# Patient Record
Sex: Female | Born: 1935 | Race: White | Hispanic: No | Smoking: Never smoker
Health system: Southern US, Community
[De-identification: ages and names within clinical notes are randomized; demographics above are authoritative.]

## PROBLEM LIST (undated history)

## (undated) DIAGNOSIS — I493 Ventricular premature depolarization: Secondary | ICD-10-CM

## (undated) DIAGNOSIS — I4719 Other supraventricular tachycardia: Secondary | ICD-10-CM

## (undated) DIAGNOSIS — I951 Orthostatic hypotension: Secondary | ICD-10-CM

## (undated) DIAGNOSIS — I340 Nonrheumatic mitral (valve) insufficiency: Secondary | ICD-10-CM

## (undated) DIAGNOSIS — E7800 Pure hypercholesterolemia, unspecified: Secondary | ICD-10-CM

## (undated) DIAGNOSIS — I471 Supraventricular tachycardia: Secondary | ICD-10-CM

## (undated) DIAGNOSIS — C44722 Squamous cell carcinoma of skin of right lower limb, including hip: Secondary | ICD-10-CM

## (undated) DIAGNOSIS — I10 Essential (primary) hypertension: Secondary | ICD-10-CM

## (undated) DIAGNOSIS — R002 Palpitations: Secondary | ICD-10-CM

## (undated) HISTORY — DX: Pure hypercholesterolemia, unspecified: E78.00

## (undated) HISTORY — DX: Squamous cell carcinoma of skin of right lower limb, including hip: C44.722

## (undated) HISTORY — DX: Ventricular premature depolarization: I49.3

## (undated) HISTORY — DX: Supraventricular tachycardia: I47.1

## (undated) HISTORY — DX: Essential (primary) hypertension: I10

## (undated) HISTORY — PX: APPENDECTOMY: SHX54

## (undated) HISTORY — DX: Palpitations: R00.2

## (undated) HISTORY — DX: Nonrheumatic mitral (valve) insufficiency: I34.0

## (undated) HISTORY — DX: Other supraventricular tachycardia: I47.19

## (undated) HISTORY — DX: Orthostatic hypotension: I95.1

---

## 1998-04-30 ENCOUNTER — Other Ambulatory Visit: Admission: RE | Admit: 1998-04-30 | Discharge: 1998-04-30 | Payer: Self-pay | Admitting: Obstetrics & Gynecology

## 1998-09-06 ENCOUNTER — Emergency Department (HOSPITAL_COMMUNITY): Admission: EM | Admit: 1998-09-06 | Discharge: 1998-09-06 | Payer: Self-pay | Admitting: Emergency Medicine

## 1999-06-28 ENCOUNTER — Encounter: Payer: Self-pay | Admitting: Cardiology

## 1999-06-28 ENCOUNTER — Inpatient Hospital Stay (HOSPITAL_COMMUNITY): Admission: EM | Admit: 1999-06-28 | Discharge: 1999-07-02 | Payer: Self-pay | Admitting: Emergency Medicine

## 1999-06-30 ENCOUNTER — Encounter: Payer: Self-pay | Admitting: Cardiology

## 1999-09-06 ENCOUNTER — Other Ambulatory Visit: Admission: RE | Admit: 1999-09-06 | Discharge: 1999-09-06 | Payer: Self-pay | Admitting: Obstetrics & Gynecology

## 2000-02-14 ENCOUNTER — Emergency Department (HOSPITAL_COMMUNITY): Admission: EM | Admit: 2000-02-14 | Discharge: 2000-02-14 | Payer: Self-pay | Admitting: Emergency Medicine

## 2001-04-03 ENCOUNTER — Other Ambulatory Visit: Admission: RE | Admit: 2001-04-03 | Discharge: 2001-04-03 | Payer: Self-pay | Admitting: Obstetrics & Gynecology

## 2001-08-21 ENCOUNTER — Other Ambulatory Visit: Admission: RE | Admit: 2001-08-21 | Discharge: 2001-08-21 | Payer: Self-pay | Admitting: Obstetrics & Gynecology

## 2002-09-18 ENCOUNTER — Other Ambulatory Visit: Admission: RE | Admit: 2002-09-18 | Discharge: 2002-09-18 | Payer: Self-pay | Admitting: Obstetrics & Gynecology

## 2002-12-02 ENCOUNTER — Encounter: Payer: Self-pay | Admitting: Otolaryngology

## 2002-12-02 ENCOUNTER — Encounter: Admission: RE | Admit: 2002-12-02 | Discharge: 2002-12-02 | Payer: Self-pay | Admitting: Otolaryngology

## 2003-10-14 ENCOUNTER — Other Ambulatory Visit: Admission: RE | Admit: 2003-10-14 | Discharge: 2003-10-14 | Payer: Self-pay | Admitting: Obstetrics & Gynecology

## 2004-01-09 ENCOUNTER — Other Ambulatory Visit: Admission: RE | Admit: 2004-01-09 | Discharge: 2004-01-09 | Payer: Self-pay | Admitting: Obstetrics & Gynecology

## 2004-07-20 ENCOUNTER — Other Ambulatory Visit: Admission: RE | Admit: 2004-07-20 | Discharge: 2004-07-20 | Payer: Self-pay | Admitting: Obstetrics & Gynecology

## 2005-07-29 ENCOUNTER — Other Ambulatory Visit: Admission: RE | Admit: 2005-07-29 | Discharge: 2005-07-29 | Payer: Self-pay | Admitting: Obstetrics & Gynecology

## 2006-02-28 ENCOUNTER — Other Ambulatory Visit: Admission: RE | Admit: 2006-02-28 | Discharge: 2006-02-28 | Payer: Self-pay | Admitting: Obstetrics & Gynecology

## 2006-09-28 ENCOUNTER — Ambulatory Visit: Payer: Self-pay | Admitting: Internal Medicine

## 2007-06-06 ENCOUNTER — Encounter: Admission: RE | Admit: 2007-06-06 | Discharge: 2007-06-06 | Payer: Self-pay | Admitting: Cardiology

## 2007-07-11 ENCOUNTER — Encounter: Payer: Self-pay | Admitting: Emergency Medicine

## 2007-07-11 ENCOUNTER — Ambulatory Visit: Payer: Self-pay | Admitting: Oncology

## 2007-08-08 ENCOUNTER — Other Ambulatory Visit: Admission: RE | Admit: 2007-08-08 | Discharge: 2007-08-08 | Payer: Self-pay | Admitting: *Deleted

## 2007-11-30 ENCOUNTER — Encounter: Admission: RE | Admit: 2007-11-30 | Discharge: 2007-11-30 | Payer: Self-pay | Admitting: Orthopedic Surgery

## 2007-12-02 ENCOUNTER — Ambulatory Visit (HOSPITAL_COMMUNITY): Admission: RE | Admit: 2007-12-02 | Discharge: 2007-12-02 | Payer: Self-pay | Admitting: Orthopedic Surgery

## 2010-05-04 ENCOUNTER — Ambulatory Visit: Payer: Self-pay | Admitting: Vascular Surgery

## 2010-08-03 ENCOUNTER — Ambulatory Visit: Payer: Self-pay | Admitting: Cardiology

## 2010-08-05 ENCOUNTER — Ambulatory Visit: Payer: Self-pay | Admitting: Cardiology

## 2010-08-12 ENCOUNTER — Ambulatory Visit (HOSPITAL_COMMUNITY): Admission: RE | Admit: 2010-08-12 | Discharge: 2010-08-12 | Payer: Self-pay | Admitting: Orthopedic Surgery

## 2010-08-19 ENCOUNTER — Ambulatory Visit: Payer: Self-pay | Admitting: Cardiology

## 2010-08-20 ENCOUNTER — Encounter: Admission: RE | Admit: 2010-08-20 | Discharge: 2010-08-20 | Payer: Self-pay | Admitting: Cardiology

## 2010-09-08 ENCOUNTER — Encounter: Admission: RE | Admit: 2010-09-08 | Discharge: 2010-09-20 | Payer: Self-pay | Admitting: Orthopedic Surgery

## 2010-12-09 ENCOUNTER — Ambulatory Visit: Payer: Self-pay | Admitting: Cardiology

## 2010-12-16 ENCOUNTER — Ambulatory Visit: Payer: Self-pay | Admitting: Cardiology

## 2011-01-16 ENCOUNTER — Encounter (HOSPITAL_COMMUNITY): Payer: Self-pay | Admitting: Oncology

## 2011-03-11 LAB — SURGICAL PCR SCREEN: Staphylococcus aureus: NEGATIVE

## 2011-05-10 NOTE — Procedures (Signed)
CAROTID DUPLEX EXAM   INDICATION:  Slurred speech, trouble thinking clearly, ?TIA.   HISTORY:  Diabetes:  No.  Cardiac:  No.  Hypertension:  No.  Smoking:  No.  Previous Surgery:  No.  CV History:  Complaint of slurred speech and memory loss on 05/01/2010.  Amaurosis Fugax No, Paresthesias No, Hemiparesis No.                                       RIGHT             LEFT  Brachial systolic pressure:         130               138  Brachial Doppler waveforms:         Normal            Normal  Vertebral direction of flow:        Antegrade         Antegrade  DUPLEX VELOCITIES (cm/sec)  CCA peak systolic                   52                45  ECA peak systolic                   76                45  ICA peak systolic                   56                48  ICA end diastolic                   24                16  PLAQUE MORPHOLOGY:                  Heterogenous      Heterogenous  PLAQUE AMOUNT:                      Minimal           Minimal  PLAQUE LOCATION:                    ICA, ECA          ECA   IMPRESSION:  1. 1% to 39% stenosis of the right internal carotid artery.  2. No evidence of stenosis in the left internal carotid artery.   A preliminary copy was faxed to Dr. Yevonne Pax office on 05/04/2010.   ___________________________________________  Quita Skye. Hart Rochester, M.D.   CH/MEDQ  D:  05/04/2010  T:  05/04/2010  Job:  [id number]

## 2011-06-01 ENCOUNTER — Telehealth: Payer: Self-pay | Admitting: Cardiology

## 2011-06-01 NOTE — Telephone Encounter (Signed)
Advised patient

## 2011-06-01 NOTE — Telephone Encounter (Signed)
Any of the dermatologists in [facility] dermatology associates would be fine.

## 2011-06-01 NOTE — Telephone Encounter (Signed)
PT ASKING FOR A RECOMMENDATION FOR A DERMATOLOGIST.

## 2011-06-01 NOTE — Telephone Encounter (Signed)
Who do you recommend?

## 2011-06-02 ENCOUNTER — Telehealth: Payer: Self-pay | Admitting: Cardiology

## 2011-06-02 NOTE — Telephone Encounter (Signed)
Left message

## 2011-06-02 NOTE — Telephone Encounter (Signed)
Called wanting to ask another question about her dermatologist recommendation. Please call back.

## 2011-06-03 NOTE — Telephone Encounter (Signed)
Spoke with patient regarding dermatologist.

## 2011-06-13 ENCOUNTER — Telehealth: Payer: Self-pay | Admitting: Cardiology

## 2011-06-13 NOTE — Telephone Encounter (Signed)
Wanted to wait and see Dr. Patty Sermons only.  Feels like she has had memory changes over the last 2 weeks and very concerned. Scheduled appointment for mon.  Advised if she did get worse and wanted to see Lawson Fiscal to call back and would get her in.

## 2011-06-13 NOTE — Telephone Encounter (Signed)
PT SAID HEARING A CLICKING SOUND IN HER NECK AND HAVING A HARD TIME THINKING AT TIMES. CONCERNED. DUE FOR A CK SHE SAID BUT NOTHING SOON AVAILABLE.

## 2011-06-15 ENCOUNTER — Encounter: Payer: Self-pay | Admitting: *Deleted

## 2011-06-20 ENCOUNTER — Encounter: Payer: Self-pay | Admitting: Cardiology

## 2011-06-20 ENCOUNTER — Ambulatory Visit (INDEPENDENT_AMBULATORY_CARE_PROVIDER_SITE_OTHER): Payer: BC Managed Care – PPO | Admitting: Cardiology

## 2011-06-20 VITALS — BP 130/78 | HR 64 | Wt 101.0 lb

## 2011-06-20 DIAGNOSIS — R5381 Other malaise: Secondary | ICD-10-CM

## 2011-06-20 DIAGNOSIS — E7800 Pure hypercholesterolemia, unspecified: Secondary | ICD-10-CM

## 2011-06-20 DIAGNOSIS — M810 Age-related osteoporosis without current pathological fracture: Secondary | ICD-10-CM

## 2011-06-20 DIAGNOSIS — R002 Palpitations: Secondary | ICD-10-CM | POA: Insufficient documentation

## 2011-06-20 DIAGNOSIS — R5383 Other fatigue: Secondary | ICD-10-CM | POA: Insufficient documentation

## 2011-06-20 DIAGNOSIS — Z79899 Other long term (current) drug therapy: Secondary | ICD-10-CM

## 2011-06-20 DIAGNOSIS — E785 Hyperlipidemia, unspecified: Secondary | ICD-10-CM

## 2011-06-20 HISTORY — DX: Age-related osteoporosis without current pathological fracture: M81.0

## 2011-06-20 MED ORDER — METOPROLOL TARTRATE 25 MG PO TABS: 25.0000 mg | ORAL_TABLET | Freq: Two times a day (BID) | ORAL | Status: DC | PRN

## 2011-06-20 NOTE — Assessment & Plan Note (Signed)
The patient hasn't history of osteoporosis.  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  For this reason she really is not a good candidate for the osteoporosis medications.  She will continue to try to get plenty of regular walking exercise and to takeCalcium 1200 mg daily with vitamin D.

## 2011-06-20 NOTE — Progress Notes (Signed)
Samantha Gregory Date of Birth:  09/10/1935 [facility] [address]   [address] [place], [place]  [address] [phone]           Fax   [phone]  History of Present Illness: This pleasant 76 year old woman is seen for a six-month followup office visit.  She has a history of palpitations and hypercholesterolemia.  In the summer of 2001 she had a questionable TIA and she underwent a two-dimensional echocardiogram with bubble study which did not show any embolic source.  She also had carotid Dopplers which did not show any significant stenosis.  She was initially placed on aspirin which she subsequently up because of the increased vascular bruising.  She's had no recurrent TIA symptoms.  She has been experiencing some recent headaches.  She does not have any history of known ischemic heart disease or angina pectoris.  She has had a good appetite but has lost 4 pounds since last visit.  She has a history of osteoporosis by bone density studies but is not a good candidate for week last or other osteoporosis medications because of already having problems with osteonecrosis of the jaw and she has lost 2 teeth as a result of the osteonecrosis of the Jaw.  Current Outpatient Prescriptions  Medication Sig Dispense Refill  . CALCIUM PO Take 1 tablet by mouth daily. Taking 500 twice a week includes vitamin d also      . fish oil-omega-3 fatty acids 1000 MG capsule Take by mouth daily. Taking 1200 daily      . DISCONTD: propranolol (INDERAL) 20 MG tablet Take 20 mg by mouth as needed.        . metoprolol tartrate (LOPRESSOR) 25 MG tablet Take 1 tablet (25 mg total) by mouth 2 (two) times daily as needed (Use as needed for rapid heart beat.).  60 tablet  11  . DISCONTD: Cholecalciferol (VITAMIN D-3 PO) Take 1 tablet by mouth daily.         Allergies  Allergen Reactions  . Statins     Patient Active Problem List  Diagnoses  . Hypercholesterolemia  . Malaise and  fatigue  . Osteoporosis  . Rapid palpitations    History  Smoking status  . Never Smoker   Smokeless tobacco  . Not on file    History  Alcohol Use: Not on file    Family History  Problem Relation Age of Onset  . Heart attack Father   . Stroke Mother   . Kidney disease Mother   . Stroke Paternal Uncle     several paternal uncles  . Stroke Paternal Aunt     Review of Systems: Constitutional: no fever chills diaphoresis  Head and neck: no hearing loss, no epistaxis, no photophobia or visual disturbance. Respiratory: No cough, shortness of breath or wheezing. Cardiovascular: No chest pain peripheral edema, palpitations. Gastrointestinal: No abdominal distention, no abdominal pain, no change in bowel habits hematochezia or melena. Genitourinary: No dysuria, no frequency, no urgency, no nocturia. Musculoskeletal:No arthralgias, no back pain, no gait disturbance or myalgias. Neurological: No dizziness, , no numbness, no seizures, no syncope, no weakness, no tremors. Hematologic: No lymphadenopathy, no easy bruising. Psychiatric: No confusion, no hallucinations, no sleep disturbance.    Physical Exam: Filed Vitals:   06/20/11 1127  BP: 130/78  Pulse: 64  The general appearance reveals a well-developed thin woman in no acute distress.The head and neck exam reveals pupils equal and reactive.  Extraocular movements are full.  There is no scleral icterus.  The mouth and pharynx are normal.  The neck is supple.  The carotids reveal no bruits.  The jugular venous pressure is normal.  The  thyroid is not enlarged.  There is no lymphadenopathy.  The chest is clear to percussion and auscultation.  There are no rales or rhonchi.  Expansion of the chest is symmetrical.  The precordium is quiet.  The first heart sound is normal.  The second heart sound is physiologically split.  There is no murmur gallop rub or click.  There is no abnormal lift or heave.  The abdomen is soft and nontender.   The bowel sounds are normal.  The liver and spleen are not enlarged.  There are no abdominal masses.  There are no abdominal bruits.  Extremities reveal good pedal pulses.  There is no phlebitis or edema.  There is no cyanosis or clubbing.  Strength is normal and symmetrical in all extremities.  There is no lateralizing weakness.  There are no sensory deficits.  The skin is warm and dry.  There is no rash.   Assessment / Plan: We are stopping her propranolol because of the malaise and fatigue and problems with her memory and we will use metoprolol tartrate 25 mg one twice a day p.r.n. For palpitations instead of propranolol.  She will return in the next several mornings for a fasting lab work and will also get a CBC because of her malaise and fatigue.  Recheck in 6 months for followup office visit and fasting lab work.

## 2011-06-20 NOTE — Assessment & Plan Note (Signed)
The patient has not been feeling well recently.  She's been experiencing malaise and fatigue.  She's also been having headaches.  She's also had problems with her memory and has difficulty remembering names.  She saw Dr. Haroldine Laws last week about her headaches and she has an MRI of the head scheduled for this afternoon.

## 2011-06-20 NOTE — Assessment & Plan Note (Signed)
The patient has a past history of hypercholesterolemia.  She does not tolerate statins.  She did not come fasting today so we will have her return in the next several days for fasting lab work to see where we are with her lipid status

## 2011-06-20 NOTE — Assessment & Plan Note (Signed)
The patient has a history of paroxysmal rapid heart action and palpitations.  These episodes have been occurring more frequently and she has been taking more propranolol as a result.  The increased frequency of propranolol use may be affecting her memory could also be contributing to her malaise and fatigue

## 2011-06-23 ENCOUNTER — Other Ambulatory Visit: Payer: Self-pay | Admitting: *Deleted

## 2011-06-23 DIAGNOSIS — Z79899 Other long term (current) drug therapy: Secondary | ICD-10-CM

## 2011-06-24 ENCOUNTER — Other Ambulatory Visit: Payer: Self-pay | Admitting: Cardiology

## 2011-06-24 ENCOUNTER — Other Ambulatory Visit (INDEPENDENT_AMBULATORY_CARE_PROVIDER_SITE_OTHER): Payer: BC Managed Care – PPO | Admitting: *Deleted

## 2011-06-24 DIAGNOSIS — E785 Hyperlipidemia, unspecified: Secondary | ICD-10-CM

## 2011-06-24 DIAGNOSIS — Z79899 Other long term (current) drug therapy: Secondary | ICD-10-CM

## 2011-06-24 LAB — CBC WITH DIFFERENTIAL/PLATELET
Eosinophils Absolute: 0 10*3/uL (ref 0.0–0.7)
Eosinophils Relative: 0.9 % (ref 0.0–5.0)
HCT: 36.5 % (ref 36.0–46.0)
Hemoglobin: 12.6 g/dL (ref 12.0–15.0)
Lymphocytes Relative: 23.8 % (ref 12.0–46.0)
Lymphs Abs: 1 10*3/uL (ref 0.7–4.0)
MCHC: 34.4 g/dL (ref 30.0–36.0)
MCV: 87.5 fl (ref 78.0–100.0)
Neutrophils Relative %: 68.2 % (ref 43.0–77.0)
RBC: 4.17 Mil/uL (ref 3.87–5.11)

## 2011-06-24 LAB — BASIC METABOLIC PANEL
BUN: 20 mg/dL (ref 6–23)
CO2: 28 mEq/L (ref 19–32)
Calcium: 9.4 mg/dL (ref 8.4–10.5)
Creatinine, Ser: 0.7 mg/dL (ref 0.4–1.2)
Glucose, Bld: 88 mg/dL (ref 70–99)
Potassium: 4.3 mEq/L (ref 3.5–5.1)
Sodium: 139 mEq/L (ref 135–145)

## 2011-06-24 LAB — HEPATIC FUNCTION PANEL
ALT: 17 U/L (ref 0–35)
Albumin: 4.1 g/dL (ref 3.5–5.2)
Total Bilirubin: 0.9 mg/dL (ref 0.3–1.2)

## 2011-06-24 LAB — LIPID PANEL
Total CHOL/HDL Ratio: 3
Triglycerides: 82 mg/dL (ref 0.0–149.0)

## 2011-06-27 ENCOUNTER — Telehealth: Payer: Self-pay | Admitting: *Deleted

## 2011-06-27 NOTE — Telephone Encounter (Signed)
Advised patient of lab work samples given of zetia

## 2011-06-30 ENCOUNTER — Encounter: Payer: Self-pay | Admitting: Cardiology

## 2011-10-03 LAB — CREATININE, SERUM
Creatinine, Ser: 0.6
GFR calc Af Amer: >60

## 2011-12-30 ENCOUNTER — Ambulatory Visit: Payer: BC Managed Care – PPO | Admitting: Cardiology

## 2012-01-09 ENCOUNTER — Other Ambulatory Visit: Payer: BC Managed Care – PPO | Admitting: *Deleted

## 2012-01-09 ENCOUNTER — Other Ambulatory Visit: Payer: Self-pay | Admitting: *Deleted

## 2012-01-09 ENCOUNTER — Other Ambulatory Visit (INDEPENDENT_AMBULATORY_CARE_PROVIDER_SITE_OTHER): Payer: Medicare Other | Admitting: *Deleted

## 2012-01-09 ENCOUNTER — Other Ambulatory Visit: Payer: Self-pay | Admitting: Cardiology

## 2012-01-09 DIAGNOSIS — E785 Hyperlipidemia, unspecified: Secondary | ICD-10-CM

## 2012-01-09 LAB — LIPID PANEL
HDL: 83.5 mg/dL (ref >39.00)
Triglycerides: 111 mg/dL (ref 0.0–149.0)

## 2012-01-09 LAB — BASIC METABOLIC PANEL
BUN: 17 mg/dL (ref 6–23)
CO2: 28 mEq/L (ref 19–32)
Calcium: 8.6 mg/dL (ref 8.4–10.5)
Creatinine, Ser: 0.7 mg/dL (ref 0.4–1.2)
GFR: 86.93 mL/min (ref >60.00)
Glucose, Bld: 81 mg/dL (ref 70–99)

## 2012-01-10 LAB — LDL CHOLESTEROL, DIRECT: Direct LDL: 123.2 mg/dL

## 2012-01-11 ENCOUNTER — Ambulatory Visit: Payer: BC Managed Care – PPO | Admitting: Cardiology

## 2012-01-18 ENCOUNTER — Ambulatory Visit (INDEPENDENT_AMBULATORY_CARE_PROVIDER_SITE_OTHER): Payer: Medicare Other | Admitting: Cardiology

## 2012-01-18 ENCOUNTER — Encounter: Payer: Self-pay | Admitting: Cardiology

## 2012-01-18 VITALS — BP 108/72 | HR 60 | Ht 63.0 in | Wt 102.0 lb

## 2012-01-18 DIAGNOSIS — E7800 Pure hypercholesterolemia, unspecified: Secondary | ICD-10-CM

## 2012-01-18 DIAGNOSIS — R002 Palpitations: Secondary | ICD-10-CM

## 2012-01-18 NOTE — Progress Notes (Signed)
Samantha Gregory Date of Birth:  [birth year] [facility] [address] [place], [place]  [address] [phone]         Fax   [phone]  History of Present Illness: This pleasant 77 year old woman is seen for a scheduled followup office visit.  She has a history of palpitations and hypercholesterolemia.  In 2001 she had a questionable TIA and she had a echocardiogram with bubble study at that time which did not show any embolic source.  She's had carotid Dopplers which were negative.  She has not been experiencing any chest pain.  She has occasional palpitations.  She's had some gynecologic issues with abnormal Pap smear and is asking for suggestions about a second opinion and I gave her the name of Dr. Ambrose Mantle.  She does not want to have to take hormones.  Current Outpatient Prescriptions  Medication Sig Dispense Refill  . CALCIUM PO Take 1 tablet by mouth daily. Taking 500 twice a week includes vitamin d also      . fish oil-omega-3 fatty acids 1000 MG capsule Take by mouth daily. Taking 1200 daily      . metoprolol tartrate (LOPRESSOR) 25 MG tablet Take 1 tablet (25 mg total) by mouth 2 (two) times daily as needed (Use as needed for rapid heart beat.).  60 tablet  11    Allergies  Allergen Reactions  . Statins     Patient Active Problem List  Diagnoses  . Hypercholesterolemia  . Malaise and fatigue  . Osteoporosis  . Rapid palpitations    History  Smoking status  . Never Smoker   Smokeless tobacco  . Not on file    History  Alcohol Use: Not on file    Family History  Problem Relation Age of Onset  . Heart attack Father   . Stroke Mother   . Kidney disease Mother   . Stroke Paternal Uncle     several paternal uncles  . Stroke Paternal Aunt     Review of Systems: Constitutional: no fever chills diaphoresis or fatigue or change in weight.  Head and neck: no hearing loss, no epistaxis, no photophobia or visual disturbance. Respiratory:  No cough, shortness of breath or wheezing. Cardiovascular: No chest pain peripheral edema, palpitations. Gastrointestinal: No abdominal distention, no abdominal pain, no change in bowel habits hematochezia or melena. Genitourinary: No dysuria, no frequency, no urgency, no nocturia. Musculoskeletal:No arthralgias, no back pain, no gait disturbance or myalgias. Neurological: No dizziness, no headaches, no numbness, no seizures, no syncope, no weakness, no tremors. Hematologic: No lymphadenopathy, no easy bruising. Psychiatric: No confusion, no hallucinations, no sleep disturbance.    Physical Exam: Filed Vitals:   01/18/12 1103  BP: 108/72  Pulse: 60   on exam the general appearance is that of a thin elderly woman in no distress.The head and neck exam reveals pupils equal and reactive.  Extraocular movements are full.  There is no scleral icterus.  The mouth and pharynx are normal.  The neck is supple.  The carotids reveal no bruits.  The jugular venous pressure is normal.  The  thyroid is not enlarged.  There is no lymphadenopathy.  The chest is clear to percussion and auscultation.  There are no rales or rhonchi.  Expansion of the chest is symmetrical.  The precordium is quiet.  Occasional premature beats are audible.  The first heart sound is normal.  The second heart sound is physiologically split.  There is no murmur gallop rub or  click.  There is no abnormal lift or heave.  The abdomen is soft and nontender.  The bowel sounds are normal.  The liver and spleen are not enlarged.  There are no abdominal masses.  There are no abdominal bruits.  Extremities reveal good pedal pulses.  There is no phlebitis or edema.  There is no cyanosis or clubbing.  Strength is normal and symmetrical in all extremities.  There is no lateralizing weakness.  There are no sensory deficits.  The skin is warm and dry.  There is no rash.     Assessment / Plan:  Continue same medication.  I have encouraged her to do  as much aerobic activity and she can.  She is limited somewhat by A. arthritic left knee and eventually she anticipates having to have surgery by Dr. Arnette Schaumann on her left knee.  2 return here in 6 months for followup office visit EKG and fasting lab work.

## 2012-01-18 NOTE — Patient Instructions (Signed)
Decrease your coffee intake  Your physician recommends that you continue on your current medications as directed. Please refer to the Current Medication list given to you today.  Your physician wants you to follow-up in: 6 months You will receive a reminder letter in the mail two months in advance. If you don't receive a letter, please call our office to schedule the follow-up appointment.

## 2012-01-18 NOTE — Assessment & Plan Note (Signed)
The patient has not been experiencing any rapid palpitations.  She does note occasional premature beats.  She drank extra coffee today and her palpitations are a little more active today.

## 2012-01-18 NOTE — Assessment & Plan Note (Signed)
We reviewed her recent labs which show improvement in HDL and lowering of her LDL level.  She is on omega-3 fatty acids.  She cannot take statins.

## 2012-01-19 ENCOUNTER — Telehealth: Payer: Self-pay | Admitting: Cardiology

## 2012-01-19 NOTE — Telephone Encounter (Signed)
looked up number for dr Ambrose Mantle and given to pt.

## 2012-01-19 NOTE — Telephone Encounter (Signed)
New Msg: pt calling wanting to speak with nurse/MD to get the number of GYN Dr. Lucienne Minks recommended to pt yesterday at appt. Please return pt call to discuss further.

## 2012-03-27 ENCOUNTER — Telehealth: Payer: Self-pay | Admitting: Cardiology

## 2012-03-27 DIAGNOSIS — R42 Dizziness and giddiness: Secondary | ICD-10-CM

## 2012-03-27 MED ORDER — MECLIZINE HCL 25 MG PO TABS: 25.0000 mg | ORAL_TABLET | Freq: Four times a day (QID) | ORAL | Status: AC | PRN

## 2012-03-27 NOTE — Telephone Encounter (Signed)
Pt is having problem with dizziness

## 2012-03-27 NOTE — Telephone Encounter (Signed)
Gets dizzy, worse with movement states feels like the world in spinning.  States she feels like she is going to pass out.  Started last week.  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  No problems during day, usually at night when goes to bed.  Will forward to  Dr. Patty Sermons for review

## 2012-03-27 NOTE — Telephone Encounter (Signed)
Advised patient, call back if no better 

## 2012-03-27 NOTE — Telephone Encounter (Signed)
Add meclizine 25 mg q6h prn for vertigo.

## 2012-06-18 ENCOUNTER — Telehealth: Payer: Self-pay | Admitting: Cardiology

## 2012-06-18 NOTE — Telephone Encounter (Signed)
Advised patient

## 2012-06-18 NOTE — Telephone Encounter (Signed)
Denies recent tick bite. Patient stated she had been doing a lot of yard work and was wearing boots.  Denies any itching in area or swelling in legs.  Advised to monitor and let us know if no improvement or worse.  Still seemed concerned so advised her I would discuss with  Dr. Patty Sermons and call her back, will forward to him for review

## 2012-06-18 NOTE — Telephone Encounter (Signed)
Please return call to patient at [phone], regarding rash possible mosquito bites concerned about lime disease.

## 2012-06-18 NOTE — Telephone Encounter (Signed)
Agree with advice given.  If she does develop a rash or her symptoms worsen she should go to urgent care to be evaluated.

## 2012-06-18 NOTE — Telephone Encounter (Signed)
Please return call to patient at [facility]

## 2012-07-30 ENCOUNTER — Telehealth: Payer: Self-pay | Admitting: Cardiology

## 2012-07-30 NOTE — Telephone Encounter (Signed)
Patient had an episode yesterday where she broke out in a sweat and had a low temperature and another today.  Denies any chest pains.  States at night she does have on a occasion a hard pounding and skipping when she goes to bed which she may have a little of shortness of breath. On those days she has usually worked hard.  Scheduled an appointment for next week (was due but stated she didn't receive letter). Advised to call back if she has any problems before then, verbalized understanding.

## 2012-07-30 NOTE — Telephone Encounter (Signed)
Pt calling re hot flashes, low temp, pulse rate of about 60, pls advise

## 2012-07-30 NOTE — Telephone Encounter (Signed)
Agree with plan 

## 2012-08-07 ENCOUNTER — Encounter: Payer: Self-pay | Admitting: Cardiology

## 2012-08-07 ENCOUNTER — Ambulatory Visit (INDEPENDENT_AMBULATORY_CARE_PROVIDER_SITE_OTHER): Payer: Medicare Other | Admitting: Cardiology

## 2012-08-07 VITALS — BP 150/82 | HR 61 | Ht 64.0 in | Wt 105.0 lb

## 2012-08-07 DIAGNOSIS — E7800 Pure hypercholesterolemia, unspecified: Secondary | ICD-10-CM

## 2012-08-07 DIAGNOSIS — M79609 Pain in unspecified limb: Secondary | ICD-10-CM

## 2012-08-07 DIAGNOSIS — M79662 Pain in left lower leg: Secondary | ICD-10-CM | POA: Insufficient documentation

## 2012-08-07 DIAGNOSIS — R002 Palpitations: Secondary | ICD-10-CM

## 2012-08-07 LAB — HEPATIC FUNCTION PANEL
ALT: 17 U/L (ref 0–35)
AST: 24 U/L (ref 0–37)
Alkaline Phosphatase: 86 U/L (ref 39–117)
Bilirubin, Direct: 0.1 mg/dL (ref 0.0–0.3)
Total Bilirubin: 1 mg/dL (ref 0.3–1.2)

## 2012-08-07 LAB — BASIC METABOLIC PANEL
BUN: 21 mg/dL (ref 6–23)
Creatinine, Ser: 0.6 mg/dL (ref 0.4–1.2)
GFR: 112.28 mL/min (ref >60.00)
Glucose, Bld: 89 mg/dL (ref 70–99)
Potassium: 3.8 mEq/L (ref 3.5–5.1)

## 2012-08-07 LAB — LIPID PANEL: HDL: 84.3 mg/dL (ref >39.00)

## 2012-08-07 NOTE — Assessment & Plan Note (Signed)
The patient has a history of palpitations.  In the past she has had tachycardia palpitations.  Presently she has been experiencing a heavy heart beat at night followed by a skip.  She has not been expressing any chest pain to suggest.  He has not had any symptoms of CHF

## 2012-08-07 NOTE — Assessment & Plan Note (Signed)
She is concerned about some pain and discomfort and minimal swelling of her left calf.  There is no history of trauma.  Her left calf hurts when she tries to walk.  She has not been experiencing any pleuritic chest pain and she has not had any pitting edema of her foot.  We will get a venous Doppler of her legs to rule out DVT

## 2012-08-07 NOTE — Assessment & Plan Note (Signed)
The patient has a history of hypercholesterolemia.  She is concerned that her cholesterol may have gone further because of her poor diet over the past several months.  He is intolerant of statins.  We are checking fasting lab work today

## 2012-08-07 NOTE — Patient Instructions (Addendum)
Will obtain labs today and call you with the results   Your physician has requested that you have a lower or upper extremity arterial duplex. This test is an ultrasound of the arteries in the legs or arms. It looks at arterial blood flow in the legs and arms. Allow one hour for Lower and Upper Arterial scans. There are no restrictions or special instructions   Your physician recommends that you continue on your current medications as directed. Please refer to the Current Medication list given to you today.   Your physician wants you to follow-up in: 6 month You will receive a reminder letter in the mail two months in advance. If you don't receive a letter, please call our office to schedule the follow-up appointment.

## 2012-08-07 NOTE — Progress Notes (Signed)
Samantha Gregory Date of Birth:  Dec 17, 1935 [facility] [address] [place], [place]  [address] [phone]         Fax   [phone]  History of Present Illness: This 77 year old woman is seen for a scheduled followup office visit.  She has a past history of palpitations and hypercholesterolemia.  In 2001 she had a questionable TIA and had an echocardiogram with bubble study which did not show any embolic source.  She has had previous carotid Dopplers which are normal.  Recently she has been under a lot of stress.  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  She has not been on her usual careful diet.  She has been having to eat out in restaurants etc. she has also been complaining of some pain in her left calf with pain on walking.  She is worried about a blood clot.  Current Outpatient Prescriptions  Medication Sig Dispense Refill  . aspirin 81 MG tablet Take 81 mg by mouth every other day.      . Cholecalciferol (VITAMIN D-3 PO) Take 1,000 Units by mouth as directed.      . fexofenadine (ALLEGRA) 180 MG tablet Take 180 mg by mouth daily. Takes 1/2 tablet as needed      . metoprolol tartrate (LOPRESSOR) 25 MG tablet Take 1 tablet (25 mg total) by mouth 2 (two) times daily as needed (Use as needed for rapid heart beat.).  60 tablet  11  . CALCIUM PO Take 1 tablet by mouth daily. Taking 500 twice a week includes vitamin d also      . fish oil-omega-3 fatty acids 1000 MG capsule Take by mouth daily. Taking 1200 daily        Allergies  Allergen Reactions  . Statins     Patient Active Problem List  Diagnosis  . Hypercholesterolemia  . Malaise and fatigue  . Osteoporosis  . Rapid palpitations    History  Smoking  status  . Never Smoker   Smokeless tobacco  . Not on file    History  Alcohol Use: Not on file    Family History  Problem Relation Age of Onset  . Heart attack Father   . Stroke Mother   . Kidney disease Mother   . Stroke Paternal Uncle     several paternal uncles  . Stroke Paternal Aunt     Review of Systems: Constitutional: no fever chills diaphoresis or fatigue or change in weight.  Head and neck: no hearing loss, no epistaxis, no photophobia or visual disturbance. Respiratory: No cough, shortness of breath or wheezing. Cardiovascular: No chest pain peripheral edema, palpitations. Gastrointestinal: No abdominal distention, no abdominal pain, no change in bowel habits hematochezia or melena. Genitourinary: No dysuria, no frequency, no urgency, no nocturia. Musculoskeletal:No arthralgias, no back pain, no gait disturbance or myalgias. Neurological: No dizziness, no headaches, no numbness, no seizures, no syncope, no weakness, no tremors. Hematologic: No lymphadenopathy, no easy bruising. Psychiatric: No confusion, no hallucinations, no sleep disturbance.    Physical Exam: Filed Vitals:   08/07/12 1108  BP: 150/82  Pulse: 61   the general appearance reveals a well-developed well-nourished woman in no distress.The head and neck exam reveals pupils equal and reactive.  Extraocular movements are full.  There is no scleral icterus.  The mouth and pharynx are normal.  The neck is supple.  The carotids reveal no bruits.  The jugular venous pressure is normal.  The  thyroid is not enlarged.  There is no lymphadenopathy.  The chest is clear to percussion and auscultation.  There are no rales or rhonchi.  Expansion of the chest is symmetrical.  The precordium is quiet.  The first heart sound is normal.  The second heart sound is physiologically split.  There is no murmur gallop rub or click.  There is no abnormal lift or heave.  The abdomen is soft and nontender.  The bowel sounds are  normal.  The liver and spleen are not enlarged.  There are no abdominal masses.  There are no abdominal bruits.  Extremities reveal good pedal pulses.  There is no  edema.  There is tenderness behind the left calf.  There is no significant swelling however. There is no cyanosis or clubbing.  Strength is normal and symmetrical in all extremities.  There is no lateralizing weakness.  There are no sensory deficits.  The skin is warm and dry.  There is no rash.     Assessment / Plan: We will get fasting lab work today to followup on her cholesterol.  We will arrange for venous Dopplers of her legs.  We counseled her on the need to get more rest and not to be doing so much hard physical work at her age.  She rechecked in 6 months for followup office visit and EKG and fasting lab work.

## 2012-08-08 ENCOUNTER — Telehealth: Payer: Self-pay | Admitting: *Deleted

## 2012-08-08 ENCOUNTER — Telehealth: Payer: Self-pay | Admitting: Cardiology

## 2012-08-08 NOTE — Telephone Encounter (Signed)
Close  

## 2012-08-08 NOTE — Progress Notes (Signed)
Quick Note:  Please report to patient. The recent labs are stable. Continue same medication and careful diet. The LDL is up slightly because of her poor diet over the past several months while staying in [place]. Work harder on careful diet. ______

## 2012-08-08 NOTE — Telephone Encounter (Signed)
Message copied by Burnell Blanks on Wed Aug 08, 2012  4:05 PM ------      Message from: Cassell Clement      Created: Wed Aug 08, 2012  3:26 PM       Please report to patient.  The recent labs are stable. Continue same medication and careful diet.  The LDL is up slightly because of her poor diet over the past several months while staying in [place].  Work harder on careful diet.

## 2012-08-08 NOTE — Telephone Encounter (Signed)
Mailed copy of labs and left message to call if any questions  

## 2012-08-09 ENCOUNTER — Other Ambulatory Visit: Payer: Self-pay | Admitting: Cardiology

## 2012-08-09 DIAGNOSIS — M79609 Pain in unspecified limb: Secondary | ICD-10-CM

## 2012-08-10 ENCOUNTER — Encounter (INDEPENDENT_AMBULATORY_CARE_PROVIDER_SITE_OTHER): Payer: Medicare Other

## 2012-08-10 DIAGNOSIS — M79662 Pain in left lower leg: Secondary | ICD-10-CM

## 2012-08-10 DIAGNOSIS — M7989 Other specified soft tissue disorders: Secondary | ICD-10-CM

## 2012-08-10 DIAGNOSIS — M79609 Pain in unspecified limb: Secondary | ICD-10-CM

## 2012-08-14 ENCOUNTER — Telehealth: Payer: Self-pay | Admitting: *Deleted

## 2012-08-14 NOTE — Telephone Encounter (Signed)
Message copied by Burnell Blanks on Tue Aug 14, 2012  2:20 PM ------      Message from: Cassell Clement      Created: Tue Aug 14, 2012 10:20 AM       Please report.  The Dopplers on her legs were negative for any blood clots.

## 2012-08-14 NOTE — Telephone Encounter (Signed)
Left message to call back  

## 2012-08-14 NOTE — Telephone Encounter (Signed)
Advised patient

## 2012-11-26 ENCOUNTER — Telehealth: Payer: Self-pay | Admitting: Cardiology

## 2012-11-26 DIAGNOSIS — J329 Chronic sinusitis, unspecified: Secondary | ICD-10-CM

## 2012-11-26 MED ORDER — AZITHROMYCIN 250 MG PO TABS: ORAL_TABLET | ORAL | Status: DC

## 2012-11-26 NOTE — Telephone Encounter (Signed)
Head congestion for 1 week, sore throat, and hoarseness. Denies any fever. When blows her nose, sputum has blood in it. Using Mucinex and Vitamin C.  Has taken Zpak that Dr Haroldine Laws Rx'd in past for sinusitis.  Will forward to  Dr. Patty Sermons to see what he recommends

## 2012-11-26 NOTE — Telephone Encounter (Signed)
Advised patient

## 2012-11-26 NOTE — Telephone Encounter (Signed)
Z-Pak as directed.  Also over-the-counter Mucinex

## 2012-11-26 NOTE — Telephone Encounter (Signed)
plz return call to pt [phone] to discuss medication.

## 2013-01-22 ENCOUNTER — Telehealth: Payer: Self-pay | Admitting: Cardiology

## 2013-02-07 ENCOUNTER — Other Ambulatory Visit: Payer: Medicare Other

## 2013-02-14 ENCOUNTER — Ambulatory Visit: Payer: Medicare Other | Admitting: Cardiology

## 2013-02-18 ENCOUNTER — Other Ambulatory Visit (INDEPENDENT_AMBULATORY_CARE_PROVIDER_SITE_OTHER): Payer: Medicare Other

## 2013-02-18 DIAGNOSIS — E7800 Pure hypercholesterolemia, unspecified: Secondary | ICD-10-CM

## 2013-02-18 LAB — LIPID PANEL
Cholesterol: 258 mg/dL — ABNORMAL HIGH (ref 0–200)
HDL: 77.7 mg/dL (ref >39.00)
Total CHOL/HDL Ratio: 3
Triglycerides: 108 mg/dL (ref 0.0–149.0)
VLDL: 21.6 mg/dL (ref 0.0–40.0)

## 2013-02-18 LAB — BASIC METABOLIC PANEL
BUN: 19 mg/dL (ref 6–23)
Calcium: 9.2 mg/dL (ref 8.4–10.5)
Creatinine, Ser: 0.7 mg/dL (ref 0.4–1.2)
GFR: 85.26 mL/min (ref >60.00)
Glucose, Bld: 86 mg/dL (ref 70–99)
Potassium: 3.8 mEq/L (ref 3.5–5.1)

## 2013-02-18 LAB — HEPATIC FUNCTION PANEL
Albumin: 4.1 g/dL (ref 3.5–5.2)
Total Bilirubin: 0.8 mg/dL (ref 0.3–1.2)

## 2013-02-18 NOTE — Progress Notes (Signed)
Quick Note:  Please make copy of labs for patient visit. ______ 

## 2013-02-22 ENCOUNTER — Ambulatory Visit (INDEPENDENT_AMBULATORY_CARE_PROVIDER_SITE_OTHER): Payer: Medicare Other | Admitting: Cardiology

## 2013-02-22 ENCOUNTER — Encounter: Payer: Self-pay | Admitting: Cardiology

## 2013-02-22 VITALS — BP 148/78 | HR 61 | Ht 64.0 in | Wt 103.4 lb

## 2013-02-22 DIAGNOSIS — E785 Hyperlipidemia, unspecified: Secondary | ICD-10-CM

## 2013-02-22 DIAGNOSIS — E7800 Pure hypercholesterolemia, unspecified: Secondary | ICD-10-CM

## 2013-02-22 DIAGNOSIS — R002 Palpitations: Secondary | ICD-10-CM

## 2013-02-22 MED ORDER — ROSUVASTATIN CALCIUM 5 MG PO TABS: ORAL_TABLET | ORAL | Status: DC

## 2013-02-22 NOTE — Assessment & Plan Note (Signed)
The patient has significant hypercholesterolemia.  Her LDL is 142.  The patient reminded me of her strong family of heart problems.  All of her siblings have died 4 of heart problems and 2 unknown also her father and her daughter died of heart problems.  For this reason we are going to try once again to add a small dose of statin.  We will choose Crestor and give her just 5 mg Monday Wednesday and Friday only and see if she could take this without intolerable leg myalgias.

## 2013-02-22 NOTE — Patient Instructions (Addendum)
Start Crestor 5 mg Mon-Wed-Fri     Your physician wants you to follow-up in: 4 months with fasting lab. You will receive a reminder letter in the mail two months in advance. If you don't receive a letter, please call our office to schedule the follow-up appointment.

## 2013-02-22 NOTE — Assessment & Plan Note (Signed)
The patient has noted some rapid heartbeat after she goes to bed at night.  He also is more aware of the nose that her carotid artery makes on her right side.  She is not having any TIA symptoms.

## 2013-02-22 NOTE — Progress Notes (Signed)
Samantha Gregory Date of Birth:  08-12-1935 [facility] [address] [place], [place]  [address] [phone]         Fax   [phone]  History of Present Illness: This 78 year old woman is seen for a scheduled followup office visit. She has a past history of palpitations and hypercholesterolemia.  She is not presently on any statin therapy.   In 2001 she had a questionable TIA and had an echocardiogram with bubble study which did not show any embolic source. She has had previous carotid Dopplers which are normal. Recently she has been under a lot of stress. 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 She has not been on her usual careful diet. She has been having to eat out in restaurants etc.    Current Outpatient Prescriptions  Medication Sig Dispense Refill  . aspirin 81 MG tablet Take 81 mg by mouth every other day.      . Cholecalciferol (VITAMIN D-3 PO) Take 1,000 Units by mouth as directed.      . metoprolol tartrate (LOPRESSOR) 25 MG tablet Take 1 tablet (25 mg total) by mouth 2 (two) times daily as needed (Use as needed for rapid heart beat.).  60 tablet  11  . rosuvastatin (CRESTOR) 5 MG tablet Take 5 mg Mon-Wed-Fri  30 tablet  6   No current facility-administered medications for this visit.    Allergies  Allergen Reactions  . Statins     Patient Active Problem List  Diagnosis  . Hypercholesterolemia  . Malaise and fatigue  . Osteoporosis  . Rapid palpitations  . Pain of left calf    History  Smoking status  . Never Smoker   Smokeless tobacco  . Not on file    History  Alcohol Use: Not on file    Family History  Problem Relation Age of Onset  . Heart attack Father   . Stroke Mother   .  Kidney disease Mother   . Stroke Paternal Uncle     several paternal uncles  . Stroke Paternal Aunt     Review of Systems: Constitutional: no fever chills diaphoresis or fatigue or change in weight.  Head and neck: no hearing loss, no epistaxis, no photophobia or visual disturbance. Respiratory: No cough, shortness of breath or wheezing. Cardiovascular: No chest pain peripheral edema, palpitations. Gastrointestinal: No abdominal distention, no abdominal pain, no change in bowel habits hematochezia or melena. Genitourinary: No dysuria, no frequency, no urgency, no nocturia. Musculoskeletal:No arthralgias, no back pain, no gait disturbance or myalgias. Neurological: No dizziness, no headaches, no numbness, no seizures, no syncope, no weakness, no tremors. Hematologic: No lymphadenopathy, no easy bruising. Psychiatric: No confusion, no hallucinations, no sleep disturbance.    Physical Exam: Filed Vitals:   02/22/13 1002  BP: 148/78  Pulse: 61   the general appearance reveals a well-developed well-nourished slightly anxious woman in no distress.The head and neck exam reveals pupils equal and reactive.  Extraocular movements are full.  There is no scleral icterus.  The mouth and pharynx are normal.  The neck is supple.  The carotids reveal no bruits.  The jugular venous pressure is normal.  The  thyroid is not enlarged.  There is no lymphadenopathy.  The chest  is clear to percussion and auscultation.  There are no rales or rhonchi.  Expansion of the chest is symmetrical.  The precordium is quiet.  The first heart sound is normal.  The second heart sound is physiologically split.  There is no murmur gallop rub or click.  There is no abnormal lift or heave.  The abdomen is soft and nontender.  The bowel sounds are normal.  The liver and spleen are not enlarged.  There are no abdominal masses.  There are no abdominal bruits.  Extremities reveal good pedal pulses.  There is no phlebitis or edema.   There is no cyanosis or clubbing.  Strength is normal and symmetrical in all extremities.  There is no lateralizing weakness.  There are no sensory deficits.  The skin is warm and dry.  There is no rash.  EKG shows sinus bradycardia and possible left atrial enlargement as well as LVH with nonspecific ST abnormality.   Assessment / Plan: We will add Crestor 5 mg Monday Wednesday and Friday.  Continue other medicines as is and be rechecked in 4 months for followup office visit lipid panel hepatic function panel and basal metabolic panel.

## 2013-02-22 NOTE — Assessment & Plan Note (Signed)
The patient complains of lack of energy.  Actually she accomplishes quite a bit in a given day.  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

## 2013-04-01 ENCOUNTER — Encounter: Payer: Self-pay | Admitting: Cardiology

## 2013-04-01 ENCOUNTER — Ambulatory Visit (INDEPENDENT_AMBULATORY_CARE_PROVIDER_SITE_OTHER): Payer: Medicare Other | Admitting: Cardiology

## 2013-04-01 ENCOUNTER — Telehealth: Payer: Self-pay | Admitting: Cardiology

## 2013-04-01 VITALS — BP 172/76 | HR 54 | Ht 64.0 in | Wt 104.0 lb

## 2013-04-01 DIAGNOSIS — E7800 Pure hypercholesterolemia, unspecified: Secondary | ICD-10-CM

## 2013-04-01 DIAGNOSIS — K5289 Other specified noninfective gastroenteritis and colitis: Secondary | ICD-10-CM

## 2013-04-01 DIAGNOSIS — R002 Palpitations: Secondary | ICD-10-CM

## 2013-04-01 DIAGNOSIS — K529 Noninfective gastroenteritis and colitis, unspecified: Secondary | ICD-10-CM

## 2013-04-01 NOTE — Assessment & Plan Note (Signed)
The patient has had no recurrent palpitations.  She remains on Lopressor 25 mg twice a day when necessary.  Today her pulse is 54 and regular.

## 2013-04-01 NOTE — Telephone Encounter (Signed)
New problem   Would like to be seen today.    C/O pain in chest.

## 2013-04-01 NOTE — Patient Instructions (Signed)
Try some OTC Pepto - Bismol as directed  If no better or worse go to [facility]

## 2013-04-01 NOTE — Assessment & Plan Note (Signed)
The patient does not have any history of cholelithiasis.  She has had a previous appendectomy years ago.  She has not been having any recent symptoms to suggest peptic ulcer disease.  Her symptoms today are most suggestive of acute gastroenteritis or food poisoning.  Her friend with whom she ate did not eat the same things and remains well.

## 2013-04-01 NOTE — Telephone Encounter (Signed)
Follow up   Pt stated she is on her way because she need to see Dr Patty Sermons today or she stated Juliette Alcide could take  Care of her.

## 2013-04-01 NOTE — Assessment & Plan Note (Signed)
The patient has a history of hypercholesterolemia and is on Crestor.  She has not been having any side effects from the Crestor

## 2013-04-01 NOTE — Progress Notes (Signed)
Samantha Gregory Date of Birth:  1935-08-18 [facility] [address] [place], [place]  [address] [phone]         Fax   [phone]  History of Present Illness: This 78 year old woman is seen for a work in office visit.  She comes in because of concern over chest and epigastric pain of 4 hours duration. She has a past history of palpitations and hypercholesterolemia. She is not presently on any statin therapy. In 2001 she had a questionable TIA and had an echocardiogram with bubble study which did not show any embolic source. She has had previous carotid Dopplers which are normal. Recently she has been under a lot of stress.  Today she went with a friend to eat at [employer].  They arrived here at about 11 AM.  They were still being at 12 noon when she developed some low substernal and high epigastric pain which has subsequently spread down into her mid and lower abdomen bilaterally.  She has not vomited.  She has had 2 stools since the beginning of the  Abdominal pain.  There has been no hematochezia or melena.   Current Outpatient Prescriptions  Medication Sig Dispense Refill  . aspirin 81 MG tablet Take 81 mg by mouth every other day.      . Cholecalciferol (VITAMIN D-3 PO) Take 1,000 Units by mouth as directed.      . metoprolol tartrate (LOPRESSOR) 25 MG tablet Take 1 tablet (25 mg total) by mouth 2 (two) times daily as needed (Use as needed for rapid heart beat.).  60 tablet  11  . rosuvastatin (CRESTOR) 5 MG tablet Take 5 mg Mon-Wed-Fri  30 tablet  6   No current facility-administered medications for this visit.    Allergies  Allergen Reactions  . Statins     Patient Active Problem List  Diagnosis  . Hypercholesterolemia  . Malaise and fatigue  . Osteoporosis  . Rapid palpitations  . Pain of left calf    History  Smoking status  . Never Smoker   Smokeless tobacco  . Not on file    History  Alcohol Use: Not on file    Family  History  Problem Relation Age of Onset  . Heart attack Father   . Stroke Mother   . Kidney disease Mother   . Stroke Paternal Uncle     several paternal uncles  . Stroke Paternal Aunt     Review of Systems: Constitutional: no fever chills diaphoresis or fatigue or change in weight.  Head and neck: no hearing loss, no epistaxis, no photophobia or visual disturbance. Respiratory: No cough, shortness of breath or wheezing. Cardiovascular: No chest pain peripheral edema, palpitations. Gastrointestinal: No abdominal distention, no abdominal pain, no change in bowel habits hematochezia or melena. Genitourinary: No dysuria, no frequency, no urgency, no nocturia. Musculoskeletal:No arthralgias, no back pain, no gait disturbance or myalgias. Neurological: No dizziness, no headaches, no numbness, no seizures, no syncope, no weakness, no tremors. Hematologic: No lymphadenopathy, no easy bruising. Psychiatric: No confusion, no hallucinations, no sleep disturbance.    Physical Exam: Filed Vitals:   04/01/13 1622  BP: 172/76  Pulse: 54   the general appearance reveals a somewhat anxious elderly woman in no distress.The head and neck exam reveals pupils equal and reactive.  Extraocular movements are full.  There is no scleral icterus.  The mouth and pharynx are normal.  The neck is supple.  The carotids reveal no bruits.  The  jugular venous pressure is normal.  The  thyroid is not enlarged.  There is no lymphadenopathy.  The chest is clear to percussion and auscultation.  There are no rales or rhonchi.  Expansion of the chest is symmetrical.  The precordium is quiet.  The first heart sound is normal.  The second heart sound is physiologically split.  There is no murmur gallop rub or click.  There is no abnormal lift or heave.  The abdomen is soft and there is mild to moderate generalized tenderness to palpation.  There is no rebound tenderness.  The bowel sounds are hyperactive  The liver and spleen  are not enlarged.  There are no abdominal masses.  There are no abdominal bruits.  There is a old well healed midline lower abdominal incision from her previous appendectomy.  Extremities reveal good pedal pulses.  There is no phlebitis or edema.  There is no cyanosis or clubbing.  Strength is normal and symmetrical in all extremities.  There is no lateralizing weakness.  There are no sensory deficits.  The skin is warm and dry.  There is no rash.  EKG shows sinus bradycardia and no ischemic changes   Assessment / Plan: This most likely represents acute gastroenteritis.  I have suggested that she stay on clear liquids and she should get some over-the-counter Pepto-Bismol to take as directed.  I told her that if her symptoms worsen during the night she should go directly to [facility] for further evaluation and x-rays.  I reassured her that her symptoms were not cardiac in origin today. She will keep her regularly scheduled visit in several months, or return sooner when necessary.

## 2013-04-01 NOTE — Telephone Encounter (Signed)
Discussed with  Dr. Patty Sermons and he will see her

## 2013-05-28 ENCOUNTER — Other Ambulatory Visit: Payer: Medicare Other

## 2013-05-28 ENCOUNTER — Other Ambulatory Visit (INDEPENDENT_AMBULATORY_CARE_PROVIDER_SITE_OTHER): Payer: Medicare Other

## 2013-05-28 DIAGNOSIS — E785 Hyperlipidemia, unspecified: Secondary | ICD-10-CM

## 2013-05-28 DIAGNOSIS — R002 Palpitations: Secondary | ICD-10-CM

## 2013-05-28 LAB — HEPATIC FUNCTION PANEL
ALT: 16 U/L (ref 0–35)
AST: 25 U/L (ref 0–37)
Albumin: 3.9 g/dL (ref 3.5–5.2)
Total Protein: 6.8 g/dL (ref 6.0–8.3)

## 2013-05-28 LAB — BASIC METABOLIC PANEL
BUN: 25 mg/dL — ABNORMAL HIGH (ref 6–23)
CO2: 28 mEq/L (ref 19–32)
Calcium: 9.3 mg/dL (ref 8.4–10.5)
Chloride: 106 mEq/L (ref 96–112)
Creatinine, Ser: 0.7 mg/dL (ref 0.4–1.2)
GFR: 83.83 mL/min (ref >60.00)
Glucose, Bld: 80 mg/dL (ref 70–99)
Potassium: 3.8 mEq/L (ref 3.5–5.1)
Sodium: 142 mEq/L (ref 135–145)

## 2013-05-28 LAB — LIPID PANEL
Cholesterol: 243 mg/dL — ABNORMAL HIGH (ref 0–200)
Total CHOL/HDL Ratio: 3
Triglycerides: 67 mg/dL (ref 0.0–149.0)

## 2013-05-28 NOTE — Progress Notes (Signed)
Quick Note:  Please make copy of labs for patient visit. ______ 

## 2013-06-03 ENCOUNTER — Encounter: Payer: Self-pay | Admitting: Cardiology

## 2013-06-03 ENCOUNTER — Ambulatory Visit (INDEPENDENT_AMBULATORY_CARE_PROVIDER_SITE_OTHER): Payer: Medicare Other | Admitting: Cardiology

## 2013-06-03 VITALS — BP 118/74 | HR 64 | Ht 64.0 in | Wt 103.0 lb

## 2013-06-03 DIAGNOSIS — E7800 Pure hypercholesterolemia, unspecified: Secondary | ICD-10-CM

## 2013-06-03 DIAGNOSIS — K529 Noninfective gastroenteritis and colitis, unspecified: Secondary | ICD-10-CM

## 2013-06-03 DIAGNOSIS — R002 Palpitations: Secondary | ICD-10-CM

## 2013-06-03 DIAGNOSIS — K5289 Other specified noninfective gastroenteritis and colitis: Secondary | ICD-10-CM

## 2013-06-03 NOTE — Assessment & Plan Note (Signed)
The patient just does not tolerate any of the statins.  We tried Crestor 5 mg every other day and even this low dose caused her to have difficulty with thinking and mentation.  We will not try any new medication at this point but go with careful diet and low-cholesterol diet and recheck at her next visit in 4 months.

## 2013-06-03 NOTE — Patient Instructions (Addendum)
Stop your Crestor, keep other medications the same  Your physician wants you to follow-up in: 4 months with fasting labs (lp/bmet/hfp)  You will receive a reminder letter in the mail two months in advance. If you don't receive a letter, please call our office to schedule the follow-up appointment.

## 2013-06-03 NOTE — Progress Notes (Signed)
Samantha Gregory Date of Birth:  1934/12/27 [facility] [address] [place], [place]  [address] [phone]         Fax   [phone]  History of Present Illness: This 78 year old woman is seen for a scheduled followup office visit. She has a past history of palpitations and hypercholesterolemia. She is not presently on any statin therapy. In 2001 she had a questionable TIA and had an echocardiogram with bubble study which did not show any embolic source. She has had previous carotid Dopplers which are normal. Recently she has been under a lot of stress. She has been assisting an older 67 year old gentleman friend tried to rehabilitation some rental houses in [place]. 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 She has not been on her usual careful diet. She has been having to eat out in restaurants etc Since last visit she had to go off Crestor because it was causing her to have decreased mentation.  Current Outpatient Prescriptions  Medication Sig Dispense Refill  . aspirin 81 MG tablet Take 81 mg by mouth every other day.      . Cholecalciferol (VITAMIN D-3 PO) Take 1,000 Units by mouth as directed.      . metoprolol tartrate (LOPRESSOR) 25 MG tablet Take 1 tablet (25 mg total) by mouth 2 (two) times daily as needed (Use as needed for rapid heart beat.).  60 tablet  11   No current facility-administered medications for this visit.    Allergies  Allergen Reactions  . Statins     Patient Active Problem List   Diagnosis Date Noted  . Hypercholesterolemia 06/20/2011    Priority: High  . Malaise and fatigue 06/20/2011    Priority: High  . Osteoporosis 06/20/2011    Priority: High  . Rapid palpitations 06/20/2011    Priority: High  . Gastroenteritis, acute 04/01/2013  . Pain of left calf 08/07/2012    History  Smoking status    . Never Smoker   Smokeless tobacco  . Not on file    History  Alcohol Use: Not on file    Family History  Problem Relation Age of Onset  . Heart attack Father   . Stroke Mother   . Kidney disease Mother   . Stroke Paternal Uncle     several paternal uncles  . Stroke Paternal Aunt     Review of Systems: Constitutional: no fever chills diaphoresis or fatigue or change in weight.  Head and neck: no hearing loss, no epistaxis, no photophobia or visual disturbance. Respiratory: No cough, shortness of breath or wheezing. Cardiovascular: No chest pain peripheral edema, palpitations. Gastrointestinal: No abdominal distention, no abdominal pain, no change in bowel habits hematochezia or melena. Genitourinary: No dysuria, no frequency, no urgency, no nocturia. Musculoskeletal:No arthralgias, no back pain, no gait disturbance or myalgias. Neurological: No dizziness, no headaches, no numbness, no seizures, no syncope, no weakness, no tremors. Hematologic: No lymphadenopathy, no easy bruising. Psychiatric: No confusion, no hallucinations, no sleep disturbance.    Physical Exam: Filed Vitals:   06/03/13 1003  BP: 118/74  Pulse: 64   the general appearance reveals a thin middle-aged woman in no distress.The head and neck exam reveals pupils equal and reactive.  Extraocular movements are full.  There is no scleral icterus.  The mouth and pharynx are normal.  The neck is  supple.  The carotids reveal no bruits.  The jugular venous pressure is normal.  The  thyroid is not enlarged.  There is no lymphadenopathy.  The chest is clear to percussion and auscultation.  There are no rales or rhonchi.  Expansion of the chest is symmetrical.  The precordium is quiet.  The first heart sound is normal.  The second heart sound is physiologically split.  There is no murmur gallop rub or click.  There is no abnormal lift or heave.  The abdomen is soft and nontender.  The bowel sounds are normal.  The liver  and spleen are not enlarged.  There are no abdominal masses.  There are no abdominal bruits.  Extremities reveal good pedal pulses.  There is no phlebitis or edema.  There is no cyanosis or clubbing.  Strength is normal and symmetrical in all extremities.  There is no lateralizing weakness.  There are no sensory deficits.  The skin is warm and dry.  There is no rash.  She recently injured her left lower leg and has a skin abrasion which appears to be healing without any significant cellulitis.     Assessment / Plan: At this point we will not start any new statin.  We could consider ezetimibe but it would probably be quite expensive for the patient she thinks. Recheck in 4 months for followup office visit and fasting lipid panel.

## 2013-06-03 NOTE — Assessment & Plan Note (Signed)
She has not been having a recurrent palpitations or tachycardia

## 2013-06-03 NOTE — Assessment & Plan Note (Signed)
Her previous acute gastroenteritis has cleared and has not recurred

## 2013-06-27 ENCOUNTER — Telehealth: Payer: Self-pay | Admitting: Cardiology

## 2013-06-27 NOTE — Telephone Encounter (Signed)
Spoke with patient and she will be bringing something by for me to give to  Dr. Patty Sermons

## 2013-06-27 NOTE — Telephone Encounter (Signed)
New Prob    Pt stated she would like to speak to nurse. Did not leave details about call.

## 2013-09-19 ENCOUNTER — Telehealth: Payer: Self-pay | Admitting: Cardiology

## 2013-09-19 NOTE — Telephone Encounter (Signed)
error 

## 2013-09-19 NOTE — Telephone Encounter (Signed)
New Problem:  Pt states she would like to speak with melinda. When I asked what about.. Pt states she just wants to speak to Juliette Alcide is that asking too much... Please advise

## 2013-09-19 NOTE — Telephone Encounter (Signed)
Scratchy throat for a couple of days, has been using Allegra for about 3 days. Patient denies any fever. Advised to continue Allegra and if starts with a lot of congestion to use plain Mucinex. Call back if no better Patient verbalized understanding.

## 2013-10-07 ENCOUNTER — Other Ambulatory Visit (INDEPENDENT_AMBULATORY_CARE_PROVIDER_SITE_OTHER): Payer: Medicare Other

## 2013-10-07 DIAGNOSIS — E7800 Pure hypercholesterolemia, unspecified: Secondary | ICD-10-CM

## 2013-10-07 LAB — LIPID PANEL
HDL: 80.3 mg/dL (ref >39.00)
Total CHOL/HDL Ratio: 3
Triglycerides: 110 mg/dL (ref 0.0–149.0)
VLDL: 22 mg/dL (ref 0.0–40.0)

## 2013-10-07 LAB — HEPATIC FUNCTION PANEL
ALT: 16 U/L (ref 0–35)
Bilirubin, Direct: 0.1 mg/dL (ref 0.0–0.3)
Total Bilirubin: 1 mg/dL (ref 0.3–1.2)

## 2013-10-07 LAB — BASIC METABOLIC PANEL
BUN: 17 mg/dL (ref 6–23)
Calcium: 9.4 mg/dL (ref 8.4–10.5)
Creatinine, Ser: 0.8 mg/dL (ref 0.4–1.2)
GFR: 77.51 mL/min (ref >60.00)
Glucose, Bld: 89 mg/dL (ref 70–99)
Potassium: 3.9 mEq/L (ref 3.5–5.1)
Sodium: 139 mEq/L (ref 135–145)

## 2013-10-07 LAB — LDL CHOLESTEROL, DIRECT: Direct LDL: 143.2 mg/dL

## 2013-10-10 ENCOUNTER — Ambulatory Visit (INDEPENDENT_AMBULATORY_CARE_PROVIDER_SITE_OTHER): Payer: Medicare Other | Admitting: Cardiology

## 2013-10-10 ENCOUNTER — Encounter: Payer: Self-pay | Admitting: Cardiology

## 2013-10-10 VITALS — BP 158/90 | HR 50 | Ht 65.0 in | Wt 98.0 lb

## 2013-10-10 DIAGNOSIS — J329 Chronic sinusitis, unspecified: Secondary | ICD-10-CM

## 2013-10-10 DIAGNOSIS — R002 Palpitations: Secondary | ICD-10-CM

## 2013-10-10 DIAGNOSIS — E7800 Pure hypercholesterolemia, unspecified: Secondary | ICD-10-CM

## 2013-10-10 DIAGNOSIS — E559 Vitamin D deficiency, unspecified: Secondary | ICD-10-CM

## 2013-10-10 NOTE — Assessment & Plan Note (Signed)
We reviewed her recent labs which are satisfactory for her.  Okay to stay off statins for now and watch diet carefully.  She has actually lost 5 pounds since last visit and is too thin.

## 2013-10-10 NOTE — Assessment & Plan Note (Signed)
The patient has a history of malaise and fatigue.  She states that she is eating well but her weight is down 5 pounds.  Previously we have checked thyroid function.  She is not presently taking any vitamin D.  She previously has a history of a low vitamin D level.  She would like to restart her vitamin D and this will be okay and at her next visit we will check a vitamin D level.

## 2013-10-10 NOTE — Patient Instructions (Signed)
START TAKING ASPIRIN 81 MG DAILY  Your physician wants you to follow-up in: 4 months with fasting labs (lp/bmet/hfp/vit D)  You will receive a reminder letter in the mail two months in advance. If you don't receive a letter, please call our office to schedule the follow-up appointment.

## 2013-10-10 NOTE — Progress Notes (Signed)
Samantha Gregory Date of Birth:  1935/05/11 [address] [place], [place]  [address] [phone]         Fax   [phone]  History of Present Illness: This 78 year old woman is seen for a scheduled followup office visit. She has a past history of palpitations and hypercholesterolemia. She is not presently on any statin therapy. In 2001 she had a questionable TIA and had an echocardiogram with bubble study which did not show any embolic source. She has had previous carotid Dopplers which are normal.  The patient is no longer on statins.  She has tried most of the statins.  She had to go off the statins because it was causing her to have decreased memory.  Fortunately her HDL level is satisfactory and her risk ratio is not excessive. Since last visit she had bad sinus congestion for about a month.  Should take Mucinex and Allegra.  She gargle with salt water and gradually improved .  Current Outpatient Prescriptions  Medication Sig Dispense Refill  . aspirin 81 MG tablet Take 81 mg by mouth daily.       . Cholecalciferol (VITAMIN D-3 PO) Take 1,000 Units by mouth as needed.       . metoprolol tartrate (LOPRESSOR) 25 MG tablet Take 1 tablet (25 mg total) by mouth 2 (two) times daily as needed (Use as needed for rapid heart beat.).  60 tablet  11   No current facility-administered medications for this visit.    Allergies  Allergen Reactions  . Statins     Patient Active Problem List   Diagnosis Date Noted  . Hypercholesterolemia 06/20/2011    Priority: High  . Malaise and fatigue 06/20/2011    Priority: High  . Osteoporosis 06/20/2011    Priority: High  . Rapid palpitations 06/20/2011    Priority: High  . Gastroenteritis, acute 04/01/2013  . Pain of left calf 08/07/2012    History  Smoking status  . Never Smoker   Smokeless tobacco  . Not on file    History  Alcohol Use: Not on file    Family History  Problem Relation Age of Onset  . Heart  attack Father   . Stroke Mother   . Kidney disease Mother   . Stroke Paternal Uncle     several paternal uncles  . Stroke Paternal Aunt     Review of Systems: Constitutional: no fever chills diaphoresis or fatigue or change in weight.  Head and neck: no hearing loss, no epistaxis, no photophobia or visual disturbance. Respiratory: No cough, shortness of breath or wheezing. Cardiovascular: No chest pain peripheral edema, palpitations. Gastrointestinal: No abdominal distention, no abdominal pain, no change in bowel habits hematochezia or melena. Genitourinary: No dysuria, no frequency, no urgency, no nocturia. Musculoskeletal:No arthralgias, no back pain, no gait disturbance or myalgias. Neurological: No dizziness, no headaches, no numbness, no seizures, no syncope, no weakness, no tremors. Hematologic: No lymphadenopathy, no easy bruising. Psychiatric: No confusion, no hallucinations, no sleep disturbance.    Physical Exam: Filed Vitals:   10/10/13 1219  BP: 158/90  Pulse: 50   the general appearance reveals a thin middle-aged woman in no distress.The head and neck exam reveals pupils equal and reactive.  Extraocular movements are full.  There is no scleral icterus.  The mouth and pharynx are normal.  The neck is supple.  The carotids reveal no bruits.  The jugular venous pressure is normal.  The  thyroid is not enlarged.  There is no lymphadenopathy.  The chest is clear to percussion and auscultation.  There are no rales or rhonchi.  Expansion of the chest is symmetrical.  The precordium is quiet.  The first heart sound is normal.  The second heart sound is physiologically split.  There is no murmur gallop rub or click.  There is no abnormal lift or heave.  The abdomen is soft and nontender.  The bowel sounds are normal.  The liver and spleen are not enlarged.  There are no abdominal masses.  There are no abdominal bruits.  Extremities reveal good pedal pulses.  There is no phlebitis or  edema.  There is no cyanosis or clubbing.  Strength is normal and symmetrical in all extremities.  There is no lateralizing weakness.  There are no sensory deficits.  The skin is warm and dry.  There is no rash.  She recently injured her left lower leg and has a skin abrasion which appears to be healing without any significant cellulitis.     Assessment / Plan: At this point we will not start any new statin.  Continue current diet but I do not want her to lose any further weight. Recheck in 4 months for office visit fasting lipid panel hepatic function panel basal metabolic panel and vitamin D. Level.

## 2013-10-10 NOTE — Assessment & Plan Note (Signed)
The patient has not been having any recent palpitations. 

## 2014-02-05 ENCOUNTER — Other Ambulatory Visit (INDEPENDENT_AMBULATORY_CARE_PROVIDER_SITE_OTHER): Payer: Medicare HMO

## 2014-02-05 DIAGNOSIS — E7800 Pure hypercholesterolemia, unspecified: Secondary | ICD-10-CM

## 2014-02-05 DIAGNOSIS — E559 Vitamin D deficiency, unspecified: Secondary | ICD-10-CM

## 2014-02-05 LAB — HEPATIC FUNCTION PANEL
ALBUMIN: 3.9 g/dL (ref 3.5–5.2)
ALK PHOS: 88 U/L (ref 39–117)
ALT: 16 U/L (ref 0–35)
AST: 22 U/L (ref 0–37)
Bilirubin, Direct: 0.1 mg/dL (ref 0.0–0.3)
Total Bilirubin: 0.9 mg/dL (ref 0.3–1.2)
Total Protein: 6.8 g/dL (ref 6.0–8.3)

## 2014-02-05 LAB — LIPID PANEL
CHOL/HDL RATIO: 3
CHOLESTEROL: 216 mg/dL — AB (ref 0–200)
HDL: 73.8 mg/dL (ref >39.00)
Triglycerides: 104 mg/dL (ref 0.0–149.0)
VLDL: 20.8 mg/dL (ref 0.0–40.0)

## 2014-02-05 LAB — BASIC METABOLIC PANEL
BUN: 18 mg/dL (ref 6–23)
CHLORIDE: 104 meq/L (ref 96–112)
CO2: 30 meq/L (ref 19–32)
CREATININE: 0.7 mg/dL (ref 0.4–1.2)
Calcium: 9 mg/dL (ref 8.4–10.5)
GFR: 89.38 mL/min (ref >60.00)
Glucose, Bld: 80 mg/dL (ref 70–99)
POTASSIUM: 3.7 meq/L (ref 3.5–5.1)
Sodium: 140 mEq/L (ref 135–145)

## 2014-02-05 LAB — LDL CHOLESTEROL, DIRECT: Direct LDL: 117 mg/dL

## 2014-02-05 NOTE — Progress Notes (Signed)
Quick Note:  Please make copy of labs for patient visit. ______ 

## 2014-02-06 LAB — VITAMIN D 25 HYDROXY (VIT D DEFICIENCY, FRACTURES): Vit D, 25-Hydroxy: 33 ng/mL (ref 30–89)

## 2014-02-07 NOTE — Progress Notes (Signed)
Quick Note:  Please make copy of labs for patient visit. ______ 

## 2014-02-12 ENCOUNTER — Ambulatory Visit: Payer: Medicare Other | Admitting: Cardiology

## 2014-02-28 ENCOUNTER — Ambulatory Visit: Payer: Medicare HMO | Admitting: Cardiology

## 2014-03-05 ENCOUNTER — Telehealth: Payer: Self-pay | Admitting: Cardiology

## 2014-03-05 DIAGNOSIS — R519 Headache, unspecified: Secondary | ICD-10-CM

## 2014-03-05 DIAGNOSIS — Z20828 Contact with and (suspected) exposure to other viral communicable diseases: Secondary | ICD-10-CM

## 2014-03-05 DIAGNOSIS — R51 Headache: Principal | ICD-10-CM

## 2014-03-05 MED ORDER — OSELTAMIVIR PHOSPHATE 75 MG PO CAPS: 75.0000 mg | ORAL_CAPSULE | Freq: Two times a day (BID) | ORAL | Status: DC

## 2014-03-05 NOTE — Telephone Encounter (Signed)
Head congestion. Advised patient and sent to [facility]

## 2014-03-05 NOTE — Telephone Encounter (Signed)
Will forward to  Dr. Brackbill for review 

## 2014-03-05 NOTE — Telephone Encounter (Signed)
New message  Patient has been sitting with a friend that has the flu. Her head is now hurting and she feels that she is getting the flu. She wants to know what she can begin to take? Please call and advise.

## 2014-03-05 NOTE — Telephone Encounter (Signed)
Start patient on Tamiflu 75 mg twice a day for 5 days

## 2014-03-28 ENCOUNTER — Telehealth: Payer: Self-pay | Admitting: Cardiology

## 2014-03-28 NOTE — Telephone Encounter (Signed)
New problem    Pt fainted this morning and need to talk to nurse concerning this matter.

## 2014-03-28 NOTE — Telephone Encounter (Signed)
Agree with advice given

## 2014-03-28 NOTE — Telephone Encounter (Signed)
Spoke with patient and she stated 3 times this week when she went to get up she became dizzy and fell back almost blacking out. Patient wanted to see  Dr. Mare Ferrari and advised he was out of the office and she should recommended urgent care. She stated she wanted to wait and see  Dr. Mare Ferrari. Did schedule patient an appointment on Monday but strongly recommended being evaluated before then.

## 2014-03-31 ENCOUNTER — Ambulatory Visit (INDEPENDENT_AMBULATORY_CARE_PROVIDER_SITE_OTHER): Payer: Medicare HMO | Admitting: Cardiology

## 2014-03-31 ENCOUNTER — Encounter: Payer: Self-pay | Admitting: Cardiology

## 2014-03-31 VITALS — BP 143/85 | HR 58 | Ht 65.0 in | Wt 100.0 lb

## 2014-03-31 DIAGNOSIS — I951 Orthostatic hypotension: Secondary | ICD-10-CM

## 2014-03-31 DIAGNOSIS — R002 Palpitations: Secondary | ICD-10-CM

## 2014-03-31 DIAGNOSIS — R42 Dizziness and giddiness: Secondary | ICD-10-CM

## 2014-03-31 NOTE — Assessment & Plan Note (Signed)
The patient has had several episodes where she has become dizzy when she first tries to stand up from bed in the morning.  She falls back onto the bed.  She has not injured herself.  At other times during the day she will have mild transient lightheadedness when she stands up after being seated in a chair for a while. We checked her blood pressure today are cells.  Supine her blood pressure is 140/75 and standing it drops mildly to 130/75.

## 2014-03-31 NOTE — Assessment & Plan Note (Signed)
The patient has had some intermittent rapid tachycardia palpitations for which she takes when necessary metoprolol.  She does not take metoprolol every day.

## 2014-03-31 NOTE — Progress Notes (Signed)
[place] Date of Birth:  09-Mar-1935 [address] [place], [place]  [address] [phone]         Fax   [phone]  History of Present Illness: This 79 year old woman is seen for a work in office visit.  She has been having episodes of dizziness when she stands up. She has a past history of palpitations and hypercholesterolemia. She is not presently on any statin therapy. In 2001 she had a questionable TIA and had an echocardiogram with bubble study which did not show any embolic source. She has had previous carotid Dopplers which are normal.  The patient is no longer on statins.  She has tried most of the statins.  She had to go off the statins because it was causing her to have decreased memory.  Fortunately her HDL level is satisfactory and her risk ratio is not excessive. She has a history of occasional PVCs.  She drinks coffee in the morning but not the rest of the day.  Current Outpatient Prescriptions  Medication Sig Dispense Refill  . aspirin 81 MG tablet Take 81 mg by mouth daily.       . metoprolol tartrate (LOPRESSOR) 25 MG tablet Take 1 tablet (25 mg total) by mouth 2 (two) times daily as needed (Use as needed for rapid heart beat.).  60 tablet  11   No current facility-administered medications for this visit.    Allergies  Allergen Reactions  . Statins     Patient Active Problem List   Diagnosis Date Noted  . Hypercholesterolemia 06/20/2011    Priority: High  . Malaise and fatigue 06/20/2011    Priority: High  . Osteoporosis 06/20/2011    Priority: High  . Rapid palpitations 06/20/2011    Priority: High  . Orthostatic hypotension 03/31/2014  . Dizziness 03/31/2014  . Gastroenteritis, acute 04/01/2013  . Pain of left calf 08/07/2012    History  Smoking status  . Never Smoker   Smokeless tobacco  . Not on file    History  Alcohol Use: Not on file    Family History  Problem Relation Age of Onset  . Heart attack Father     . Stroke Mother   . Kidney disease Mother   . Stroke Paternal Uncle     several paternal uncles  . Stroke Paternal Aunt     Review of Systems: Constitutional: no fever chills diaphoresis or fatigue or change in weight.  Head and neck: no hearing loss, no epistaxis, no photophobia or visual disturbance. Respiratory: No cough, shortness of breath or wheezing. Cardiovascular: No chest pain peripheral edema, palpitations. Gastrointestinal: No abdominal distention, no abdominal pain, no change in bowel habits hematochezia or melena. Genitourinary: No dysuria, no frequency, no urgency, no nocturia. Musculoskeletal:No arthralgias, no back pain, no gait disturbance or myalgias. Neurological: No dizziness, no headaches, no numbness, no seizures, no syncope, no weakness, no tremors. Hematologic: No lymphadenopathy, no easy bruising. Psychiatric: No confusion, no hallucinations, no sleep disturbance.    Physical Exam: Filed Vitals:   03/31/14 1451  BP: 143/85  Pulse: 58   supine blood pressure is 140/75.  Standing blood pressure is 130/75.   The general appearance reveals a thin middle-aged woman in no distress.The head and neck exam reveals pupils equal and reactive.  Extraocular movements are full.  There is no scleral icterus.  The mouth and pharynx are normal.  The neck is supple.  The carotids reveal no bruits.  The jugular venous pressure  is normal.  The  thyroid is not enlarged.  There is no lymphadenopathy.  The chest is clear to percussion and auscultation.  There are no rales or rhonchi.  Expansion of the chest is symmetrical.  The precordium is quiet.  The first heart sound is normal.  The second heart sound is physiologically split.  There is no murmur gallop rub or click.  There is no abnormal lift or heave.  The abdomen is soft and nontender.  The bowel sounds are normal.  The liver and spleen are not enlarged.  There are no abdominal masses.  There are no abdominal bruits.   Extremities reveal good pedal pulses.  There is no phlebitis or edema.  There is no cyanosis or clubbing.  Strength is normal and symmetrical in all extremities.  There is no lateralizing weakness.  There are no sensory deficits.  The skin is warm and dry.  There is no rash.    EKG today shows sinus bradycardia at 58 per minute with occasional PVCs.  No ischemic changes.   Assessment / Plan: Continue current medication.  She will try to increase her intake of water and of salt to help prevent orthostatic hypotension and dizziness. She will keep her existing appointment at the end of the month for followup.  If symptoms persist we may want to consider an event monitor.

## 2014-03-31 NOTE — Patient Instructions (Addendum)
Your physician recommends that you continue on your current medications as directed. Please refer to the Current Medication list given to you today.  Increase fluid and salt intake.  Keep scheduled appointment.

## 2014-04-23 ENCOUNTER — Ambulatory Visit (INDEPENDENT_AMBULATORY_CARE_PROVIDER_SITE_OTHER): Payer: Medicare HMO | Admitting: Cardiology

## 2014-04-23 ENCOUNTER — Encounter: Payer: Self-pay | Admitting: Cardiology

## 2014-04-23 VITALS — BP 132/74 | HR 65 | Ht 65.0 in | Wt 98.0 lb

## 2014-04-23 DIAGNOSIS — E7800 Pure hypercholesterolemia, unspecified: Secondary | ICD-10-CM

## 2014-04-23 DIAGNOSIS — I951 Orthostatic hypotension: Secondary | ICD-10-CM

## 2014-04-23 DIAGNOSIS — R002 Palpitations: Secondary | ICD-10-CM

## 2014-04-23 NOTE — Progress Notes (Signed)
[place] Date of Birth:  [birth year] [place] [address] [place], [place]  [address] [phone]        Fax   [phone]   History of Present Illness: This 79 year old woman is seen for a scheduled followup office visit. She previously had been having episodes of dizziness when she stands up.  At her last visit we had her increase her salt and water intake.  She states that since she has done that she has had no further dizziness. She has a past history of palpitations and hypercholesterolemia. She is not presently on any statin therapy. In 2001 she had a questionable TIA and had an echocardiogram with bubble study which did not show any embolic source. She has had previous carotid Dopplers which are normal. The patient is no longer on statins. She has tried most of the statins. She had to go off the statins because it was causing her to have decreased memory. Fortunately her HDL level is satisfactory and her risk ratio is not excessive.  She has a history of occasional PVCs. She drinks coffee in the morning but not the rest of the day.   Current Outpatient Prescriptions  Medication Sig Dispense Refill  . aspirin 81 MG tablet Take 81 mg by mouth daily.       . metoprolol tartrate (LOPRESSOR) 25 MG tablet Take 1 tablet (25 mg total) by mouth 2 (two) times daily as needed (Use as needed for rapid heart beat.).  60 tablet  11   No current facility-administered medications for this visit.    Allergies  Allergen Reactions  . Statins     Patient Active Problem List   Diagnosis Date Noted  . Hypercholesterolemia 06/20/2011    Priority: High  . Malaise and fatigue 06/20/2011    Priority: High  . Osteoporosis 06/20/2011    Priority: High  . Rapid palpitations 06/20/2011    Priority: High  . Orthostatic hypotension 03/31/2014  . Dizziness 03/31/2014  . Gastroenteritis, acute 04/01/2013  . Pain of left calf 08/07/2012    History  Smoking  status  . Never Smoker   Smokeless tobacco  . Not on file    History  Alcohol Use: Not on file    Family History  Problem Relation Age of Onset  . Heart attack Father   . Stroke Mother   . Kidney disease Mother   . Stroke Paternal Uncle     several paternal uncles  . Stroke Paternal Aunt     Review of Systems: Constitutional: no fever chills diaphoresis or fatigue or change in weight.  Head and neck: no hearing loss, no epistaxis, no photophobia or visual disturbance. Respiratory: No cough, shortness of breath or wheezing. Cardiovascular: No chest pain peripheral edema, palpitations. Gastrointestinal: No abdominal distention, no abdominal pain, no change in bowel habits hematochezia or melena. Genitourinary: No dysuria, no frequency, no urgency, no nocturia. Musculoskeletal:No arthralgias, no back pain, no gait disturbance or myalgias. Neurological: No dizziness, no headaches, no numbness, no seizures, no syncope, no weakness, no tremors. Hematologic: No lymphadenopathy, no easy bruising. Psychiatric: No confusion, no hallucinations, no sleep disturbance.    Physical Exam: Filed Vitals:   04/23/14 0830  BP: 132/74  Pulse: 65   the general appearance reveals a thin elderly woman in no distress.The head and neck exam reveals pupils equal and reactive.  Extraocular movements are full.  There is no scleral icterus.  The mouth and pharynx are normal.  The neck is supple.  The carotids reveal no bruits.  The jugular venous pressure is normal.  The  thyroid is not enlarged.  There is no lymphadenopathy.  The chest is clear to percussion and auscultation.  There are no rales or rhonchi.  Expansion of the chest is symmetrical.  The precordium is quiet.  The first heart sound is normal.  The second heart sound is physiologically split.  There is no murmur gallop rub or click.  There is no abnormal lift or heave.  The abdomen is soft and nontender.  The bowel sounds are normal.  The liver  and spleen are not enlarged.  There are no abdominal masses.  There are no abdominal bruits.  Extremities reveal good pedal pulses.  There is no phlebitis or edema.  There is no cyanosis or clubbing.  Strength is normal and symmetrical in all extremities.  There is no lateralizing weakness.  There are no sensory deficits.  The skin is warm and dry.  There is no rash.     Assessment / Plan: The patient will continue current medication.  Continue high salt intake.  She is thin and could stand to gain 10-12 pounds by liberalizing her caloric intake as well. She is having pain in her left knee.  She had an MRI of her left knee in 2008.  It showed a torn meniscus.  She never had anything done about it.  She will call [facility] on her for a followup appointment.  Her previous orthopedist was Dr. Shellia Carwin.  She will ask for Dr. Wynelle Link. Recheck here in 4 months for followup office.  She has been baking and brought in some nice spice cakes today.

## 2014-04-23 NOTE — Assessment & Plan Note (Signed)
She remains on metoprolol 25 mg twice a day.  She has not had any recurrent tachycardia or palpitations.

## 2014-04-23 NOTE — Patient Instructions (Signed)
Your physician recommends that you continue on your current medications as directed. Please refer to the Current Medication list given to you today.  Your physician wants you to follow-up in: 4 month ov You will receive a reminder letter in the mail two months in advance. If you don't receive a letter, please call our office to schedule the follow-up appointment.  

## 2014-04-23 NOTE — Assessment & Plan Note (Signed)
Since increasing her salt and water intake she has had no further episodes of orthostatic hypotension or dizziness or near syncope.

## 2014-04-23 NOTE — Assessment & Plan Note (Signed)
We reviewed her recent labs from February 2015.  Her cholesterol and LDL are improved.  She is not on any cholesterol-lowering medication.  She is careful about her diet.  Her weight is down 2 pounds.

## 2014-05-12 ENCOUNTER — Telehealth: Payer: Self-pay | Admitting: Cardiology

## 2014-05-12 NOTE — Telephone Encounter (Signed)
Patient had several ticks she removed from her leg over the weekend. States she is sure she got them all out. Patient denies any redness or rash. Advised to be looking for fever, redness, rash, or aches and if any of these appear to seek medical attention. Patient would also like  Dr. Mare Ferrari 's recommendations. Will forward to him for review.

## 2014-05-12 NOTE — Telephone Encounter (Signed)
New message     Pt need advice on what to do---she has bites on her body--maybe tick bites.

## 2014-05-12 NOTE — Telephone Encounter (Signed)
Left message to call back  

## 2014-05-12 NOTE — Telephone Encounter (Signed)
Advised patient

## 2014-05-12 NOTE — Telephone Encounter (Signed)
Okay to just observe now. Call if any sign of rash or systemic symptoms such as fever, headache etc in which case she would need antibiotic such as doxycycline.

## 2014-07-10 NOTE — Telephone Encounter (Signed)
Close encounter 

## 2014-08-14 ENCOUNTER — Ambulatory Visit (INDEPENDENT_AMBULATORY_CARE_PROVIDER_SITE_OTHER): Payer: Medicare HMO | Admitting: *Deleted

## 2014-08-14 ENCOUNTER — Telehealth: Payer: Self-pay | Admitting: *Deleted

## 2014-08-14 DIAGNOSIS — I951 Orthostatic hypotension: Secondary | ICD-10-CM

## 2014-08-14 DIAGNOSIS — E7800 Pure hypercholesterolemia, unspecified: Secondary | ICD-10-CM

## 2014-08-14 LAB — BASIC METABOLIC PANEL
BUN: 20 mg/dL (ref 6–23)
CO2: 30 mEq/L (ref 19–32)
Calcium: 9.2 mg/dL (ref 8.4–10.5)
Chloride: 102 mEq/L (ref 96–112)
Creatinine, Ser: 0.7 mg/dL (ref 0.4–1.2)
GFR: 84.92 mL/min (ref >60.00)
GLUCOSE: 77 mg/dL (ref 70–99)
POTASSIUM: 3.4 meq/L — AB (ref 3.5–5.1)
Sodium: 139 mEq/L (ref 135–145)

## 2014-08-14 LAB — HEPATIC FUNCTION PANEL
ALBUMIN: 3.9 g/dL (ref 3.5–5.2)
ALK PHOS: 86 U/L (ref 39–117)
ALT: 16 U/L (ref 0–35)
AST: 26 U/L (ref 0–37)
Bilirubin, Direct: 0.1 mg/dL (ref 0.0–0.3)
Total Bilirubin: 1.1 mg/dL (ref 0.2–1.2)
Total Protein: 6.5 g/dL (ref 6.0–8.3)

## 2014-08-14 LAB — LIPID PANEL
Cholesterol: 206 mg/dL — ABNORMAL HIGH (ref 0–200)
HDL: 81.3 mg/dL (ref >39.00)
LDL Cholesterol: 109 mg/dL — ABNORMAL HIGH (ref 0–99)
NonHDL: 124.7
Total CHOL/HDL Ratio: 3
Triglycerides: 80 mg/dL (ref 0.0–149.0)
VLDL: 16 mg/dL (ref 0.0–40.0)

## 2014-08-14 NOTE — Progress Notes (Signed)
Quick Note:  Please report to patient. The recent labs are stable. Continue same medication and careful diet.Potassium is slightly low. Increase high potassium foods. Discuss further at [place] ______

## 2014-08-14 NOTE — Telephone Encounter (Signed)
lmptcb for lab results 

## 2014-08-15 NOTE — Telephone Encounter (Signed)
Ptcb and has been nottfied about lab results with verbal understanding

## 2014-08-15 NOTE — Telephone Encounter (Signed)
lmptcb about lab results

## 2014-08-21 ENCOUNTER — Ambulatory Visit (INDEPENDENT_AMBULATORY_CARE_PROVIDER_SITE_OTHER): Payer: Medicare HMO | Admitting: Cardiology

## 2014-08-21 ENCOUNTER — Encounter: Payer: Self-pay | Admitting: Cardiology

## 2014-08-21 VITALS — BP 140/72 | HR 80 | Ht 64.0 in | Wt 94.0 lb

## 2014-08-21 DIAGNOSIS — I951 Orthostatic hypotension: Secondary | ICD-10-CM

## 2014-08-21 DIAGNOSIS — E7800 Pure hypercholesterolemia, unspecified: Secondary | ICD-10-CM

## 2014-08-21 DIAGNOSIS — R002 Palpitations: Secondary | ICD-10-CM

## 2014-08-21 DIAGNOSIS — E559 Vitamin D deficiency, unspecified: Secondary | ICD-10-CM

## 2014-08-21 MED ORDER — ASPIRIN 81 MG PO TABS: 81.0000 mg | ORAL_TABLET | ORAL | Status: DC

## 2014-08-21 NOTE — Progress Notes (Signed)
[place] Date of Birth:  1935/04/23 [place] [address] [place], [place]  [address] [phone]        Fax   [phone]   History of Present Illness:  This 79 year old woman is seen for a scheduled followup office visit. She previously had been having episodes of dizziness when she stands up. At her last visit we had her increase her salt and water intake. She states that since she has done that she has had no further dizziness. She has a past history of palpitations and hypercholesterolemia. She is not presently on any statin therapy. In 2001 she had a questionable TIA and had an echocardiogram with bubble study which did not show any embolic source. She has had previous carotid Dopplers which are normal. The patient is no longer on statins. She has tried most of the statins. She had to go off the statins because it was causing her to have decreased memory. Fortunately her HDL level is satisfactory and her risk ratio is not excessive.  She has a history of occasional PVCs. She drinks coffee in the morning but not the rest of the day. Since last visit she has been very busy running back and forth to [place] to help care for her friend who is 34 years old and had a stroke.  The friend is now in a rehabilitation center near [place]. Mrs. Broner has lost 4 more pounds since last visit.  She weighs 94 pounds.  I want her to gain some weight.  Current Outpatient Prescriptions  Medication Sig Dispense Refill  . aspirin 81 MG tablet Take 81 mg by mouth daily.       Marland Kitchen azithromycin (ZITHROMAX) 500 MG tablet Take 500 mg by mouth daily.       . metoprolol tartrate (LOPRESSOR) 25 MG tablet Take 1 tablet (25 mg total) by mouth 2 (two) times daily as needed (Use as needed for rapid heart beat.).  60 tablet  11   No current facility-administered medications for this visit.    Allergies  Allergen Reactions  . Statins     Patient Active Problem List   Diagnosis Date Noted  . Hypercholesterolemia 06/20/2011    Priority: High  . Malaise and fatigue 06/20/2011    Priority: High  . Osteoporosis 06/20/2011    Priority: High  . Rapid palpitations 06/20/2011    Priority: High  . Orthostatic hypotension 03/31/2014  . Dizziness 03/31/2014  . Gastroenteritis, acute 04/01/2013  . Pain of left calf 08/07/2012    History  Smoking status  . Never Smoker   Smokeless tobacco  . Not on file    History  Alcohol Use: Not on file    Family History  Problem Relation Age of Onset  . Heart attack Father   . Stroke Mother   . Kidney disease Mother   . Stroke Paternal Uncle     several paternal uncles  . Stroke Paternal Aunt     Review of Systems: Constitutional: no fever chills diaphoresis or fatigue or change in weight.  Head and neck: no hearing loss, no epistaxis, no photophobia or visual disturbance. Respiratory: No cough, shortness of breath or wheezing. Cardiovascular: No chest pain peripheral edema, palpitations. Gastrointestinal: No abdominal distention, no abdominal pain, no change in bowel habits hematochezia or melena. Genitourinary: No dysuria, no frequency, no urgency, no nocturia. Musculoskeletal:No arthralgias, no back pain, no gait disturbance or myalgias. Neurological: No dizziness, no headaches, no numbness, no  seizures, no syncope, no weakness, no tremors. Hematologic: No lymphadenopathy, no easy bruising. Psychiatric: No confusion, no hallucinations, no sleep disturbance.    Physical Exam: Filed Vitals:   08/21/14 0801  BP: 140/72  Pulse: 80  The patient appears to be in no distress.  Head and neck exam reveals that the pupils are equal and reactive.  The extraocular movements are full.  There is no scleral icterus.  Mouth and pharynx are benign.  No lymphadenopathy.  No carotid bruits.  The jugular venous pressure is normal.  Thyroid is not enlarged or tender.  Chest is clear to percussion and auscultation.   No rales or rhonchi.  Expansion of the chest is symmetrical.  Heart reveals no abnormal lift or heave.  First and second heart sounds are normal.  There is no murmur gallop rub or click.  The abdomen is soft and nontender.  Bowel sounds are normoactive.  There is no hepatosplenomegaly or mass.  There are no abdominal bruits.  Extremities reveal no phlebitis or edema.  Pedal pulses are good.  There is no cyanosis or clubbing.  Neurologic exam is normal strength and no lateralizing weakness.  No sensory deficits.  Integument reveals no rash    Assessment / Plan: 1. orthostatic hypotension and dizziness, resolved by increasing salt and water intake 2. hypercholesterolemia treated with careful diet 3. osteoporosis and a vitamin D deficiency 4. tachycardia palpitations controlled with beta blocker 5. remote history of TIA without recurrent symptoms  Plan: Continue same medication.  She has easy bruising from her aspirin and we will reduce the dose to just 81 mg every other day. Recheck in 4 months for office visit panel hepatic function panel basal metabolic panel and a vitamin D level. Liberalize her diet so that she can gain some weight. Continue full activity.  She has been enjoying yard work.  She brought in some pears that she picked from her tree.

## 2014-08-21 NOTE — Assessment & Plan Note (Signed)
Her lipids are satisfactory on careful diet.  She is not on any medication for cholesterol

## 2014-08-21 NOTE — Assessment & Plan Note (Signed)
She is tolerating metoprolol.  No palpitations experienced.

## 2014-08-21 NOTE — Patient Instructions (Signed)
Your physician has recommended you make the following change in your medication:  1) REDUCE Aspirin to 81mg  every other day 2) START Over the counter Vitamin D 1000 units daily  Your physician recommends that you return for lab work in: 4 months (Vit D, Lipid, Hepatic, Bmet)  Your physician wants you to follow-up in: 4 months with Dr.Brackbill You will receive a reminder letter in the mail two months in advance. If you don't receive a letter, please call our office to schedule the follow-up appointment.

## 2014-08-21 NOTE — Assessment & Plan Note (Signed)
She has had no further orthostatic hypotension since she has increased her salt and water intake.  Her blood pressure remains normal.

## 2014-08-21 NOTE — Assessment & Plan Note (Signed)
The patient has a history of vitamin D deficiency.  We will have her continue taking vitamin D 1000 units a day.  Recheck vitamin D level next visit.

## 2014-11-14 ENCOUNTER — Telehealth: Payer: Self-pay | Admitting: Cardiology

## 2014-11-14 MED ORDER — AZITHROMYCIN 250 MG PO TABS: ORAL_TABLET | ORAL | Status: DC

## 2014-11-14 MED ORDER — AZITHROMYCIN 500 MG PO TABS: ORAL_TABLET | ORAL | Status: DC

## 2014-11-14 NOTE — Telephone Encounter (Signed)
Okay to call in a ZPak and also have her take plain Mucinex.

## 2014-11-14 NOTE — Telephone Encounter (Signed)
New Message       Pt calling stating that she needs Dr. Mare Ferrari to call in a new prescription for her in regards to her cold. Please call pt and advise.

## 2014-11-14 NOTE — Telephone Encounter (Signed)
Calling stating she has had a sore throat, chest and head congestion x 2 wks.  Has been taking OTC Coricidan D Max strength for flu and Mucinex per pharmacist suggestion. States she still is having the chest congestion and unable to clear throat.  No cough. Denies fever. States she does have allergies and has worked outside some. States the pharmacist told her to contact her physician to try to get stronger to take. Advised will forward to Dr. Mare Ferrari for his recommendations.

## 2014-11-14 NOTE — Telephone Encounter (Signed)
Notified pt that Dr. Mare Ferrari advised to call in [place] and to take plain Mucinex.  Will send Rx into [facility] in [place]. Phone [phone]

## 2014-11-26 ENCOUNTER — Telehealth: Payer: Self-pay | Admitting: *Deleted

## 2014-11-26 NOTE — Telephone Encounter (Signed)
New Message  Left vm for pt concerning flu shot 

## 2014-12-04 ENCOUNTER — Other Ambulatory Visit (INDEPENDENT_AMBULATORY_CARE_PROVIDER_SITE_OTHER): Payer: Medicare HMO | Admitting: *Deleted

## 2014-12-04 DIAGNOSIS — E7800 Pure hypercholesterolemia, unspecified: Secondary | ICD-10-CM

## 2014-12-04 DIAGNOSIS — E559 Vitamin D deficiency, unspecified: Secondary | ICD-10-CM

## 2014-12-04 LAB — BASIC METABOLIC PANEL
BUN: 20 mg/dL (ref 6–23)
CO2: 26 mEq/L (ref 19–32)
Calcium: 9.1 mg/dL (ref 8.4–10.5)
Chloride: 105 mEq/L (ref 96–112)
Creatinine, Ser: 0.8 mg/dL (ref 0.4–1.2)
GFR: 76.13 mL/min (ref >60.00)
Glucose, Bld: 81 mg/dL (ref 70–99)
POTASSIUM: 3.7 meq/L (ref 3.5–5.1)
SODIUM: 138 meq/L (ref 135–145)

## 2014-12-04 LAB — LIPID PANEL
Cholesterol: 241 mg/dL — ABNORMAL HIGH (ref 0–200)
HDL: 88.7 mg/dL (ref >39.00)
LDL Cholesterol: 138 mg/dL — ABNORMAL HIGH (ref 0–99)
NonHDL: 152.3
TRIGLYCERIDES: 72 mg/dL (ref 0.0–149.0)
Total CHOL/HDL Ratio: 3
VLDL: 14.4 mg/dL (ref 0.0–40.0)

## 2014-12-04 LAB — HEPATIC FUNCTION PANEL
ALT: 22 U/L (ref 0–35)
AST: 33 U/L (ref 0–37)
Albumin: 4.2 g/dL (ref 3.5–5.2)
Alkaline Phosphatase: 95 U/L (ref 39–117)
BILIRUBIN TOTAL: 1 mg/dL (ref 0.2–1.2)
Bilirubin, Direct: 0.1 mg/dL (ref 0.0–0.3)
TOTAL PROTEIN: 6.9 g/dL (ref 6.0–8.3)

## 2014-12-04 LAB — VITAMIN D 25 HYDROXY (VIT D DEFICIENCY, FRACTURES): VITD: 32.2 ng/mL (ref 30.00–100.00)

## 2014-12-07 NOTE — Progress Notes (Signed)
Quick Note:  Please make copy of labs for patient visit. ______ 

## 2014-12-11 ENCOUNTER — Ambulatory Visit (INDEPENDENT_AMBULATORY_CARE_PROVIDER_SITE_OTHER): Payer: Medicare HMO | Admitting: Cardiology

## 2014-12-11 ENCOUNTER — Encounter: Payer: Self-pay | Admitting: Cardiology

## 2014-12-11 VITALS — BP 126/62 | HR 67 | Ht 64.0 in | Wt 96.0 lb

## 2014-12-11 DIAGNOSIS — E7800 Pure hypercholesterolemia, unspecified: Secondary | ICD-10-CM

## 2014-12-11 DIAGNOSIS — D649 Anemia, unspecified: Secondary | ICD-10-CM

## 2014-12-11 DIAGNOSIS — I951 Orthostatic hypotension: Secondary | ICD-10-CM

## 2014-12-11 HISTORY — DX: Anemia, unspecified: D64.9

## 2014-12-11 NOTE — Patient Instructions (Signed)
Your physician wants you to follow-up in: 4 months with Dr. Mare Ferrari. You will receive a reminder letter in the mail two months in advance. If you don't receive a letter, please call our office to schedule the follow-up appointment.  Start taking a Multivitamin with Iron daily from over the counter.

## 2014-12-11 NOTE — Assessment & Plan Note (Signed)
No further syncopal episodes.  She is trying to drink more fluids and eat more salt.

## 2014-12-11 NOTE — Assessment & Plan Note (Signed)
The patient tries to exercise on a daily basis to improve bone strength and decrease osteoporosis

## 2014-12-11 NOTE — Progress Notes (Signed)
[place] Date of Birth:  04-Sep-1935 [place] [address] [place], [place]  [address] [phone]        Fax   [phone]   History of Present Illness:  This 79 year old woman is seen for a scheduled followup office visit. She previously had been having episodes of dizziness when she stands up. At her last visit we had her increase her salt and water intake. She states that since she has done that she has had no further dizziness. She has a past history of palpitations and hypercholesterolemia. She is not presently on any statin therapy. In 2001 she had a questionable TIA and had an echocardiogram with bubble study which did not show any embolic source. She has had previous carotid Dopplers which are normal. The patient is no longer on statins. She has tried most of the statins. She had to go off the statins because it was causing her to have decreased memory. Fortunately her HDL level is satisfactory and her risk ratio is not excessive.  She has a history of occasional PVCs. She drinks coffee in the morning but not the rest of the day.  Mrs. Kubin has gained 2 pounds since last visit.  She weighs 96 pounds.  I want her to gain some weight.  Current Outpatient Prescriptions  Medication Sig Dispense Refill  . aspirin 81 MG tablet Take 1 tablet (81 mg total) by mouth every other day.    . cholecalciferol (VITAMIN D) 1000 UNITS tablet Take 1,000 Units by mouth daily.    . metoprolol tartrate (LOPRESSOR) 25 MG tablet Take 1 tablet (25 mg total) by mouth 2 (two) times daily as needed (Use as needed for rapid heart beat.). 60 tablet 11  . Multiple Vitamins-Iron (MULTIVITAMIN/IRON PO) Take by mouth daily.     No current facility-administered medications for this visit.    Allergies  Allergen Reactions  . Statins     Patient Active Problem List   Diagnosis Date Noted  . Hypercholesterolemia 06/20/2011    Priority: High  . Malaise and fatigue  06/20/2011    Priority: High  . Osteoporosis 06/20/2011    Priority: High  . Rapid palpitations 06/20/2011    Priority: High  . Low hemoglobin 12/11/2014  . Orthostatic hypotension 03/31/2014  . Dizziness 03/31/2014  . Gastroenteritis, acute 04/01/2013  . Pain of left calf 08/07/2012    History  Smoking status  . Never Smoker   Smokeless tobacco  . Not on file    History  Alcohol Use: Not on file    Family History  Problem Relation Age of Onset  . Heart attack Father   . Stroke Mother   . Kidney disease Mother   . Stroke Paternal Uncle     several paternal uncles  . Stroke Paternal Aunt     Review of Systems: Constitutional: no fever chills diaphoresis or fatigue or change in weight.  Head and neck: no hearing loss, no epistaxis, no photophobia or visual disturbance. Respiratory: No cough, shortness of breath or wheezing. Cardiovascular: No chest pain peripheral edema, palpitations. Gastrointestinal: No abdominal distention, no abdominal pain, no change in bowel habits hematochezia or melena. Genitourinary: No dysuria, no frequency, no urgency, no nocturia. Musculoskeletal:No arthralgias, no back pain, no gait disturbance or myalgias. Neurological: No dizziness, no headaches, no numbness, no seizures, no syncope, no weakness, no tremors. Hematologic: No lymphadenopathy, no easy bruising. Psychiatric: No confusion, no hallucinations, no sleep disturbance.  Physical Exam: Filed Vitals:   12/11/14 1530  BP: 126/62  Pulse: 67  The patient appears to be in no distress.  Head and neck exam reveals that the pupils are equal and reactive.  The extraocular movements are full.  There is no scleral icterus.  Mouth and pharynx are benign.  No lymphadenopathy.  No carotid bruits.  The jugular venous pressure is normal.  Thyroid is not enlarged or tender.  Chest is clear to percussion and auscultation.  No rales or rhonchi.  Expansion of the chest is symmetrical.  Heart  reveals no abnormal lift or heave.  First and second heart sounds are normal.  There is no murmur gallop rub or click.  The abdomen is soft and nontender.  Bowel sounds are normoactive.  There is no hepatosplenomegaly or mass.  There are no abdominal bruits.  Extremities reveal no phlebitis or edema.  Pedal pulses are good.  There is no cyanosis or clubbing.  Neurologic exam is normal strength and no lateralizing weakness.  No sensory deficits.  Integument reveals no rash    Assessment / Plan: 1. orthostatic hypotension and dizziness, resolved by increasing salt and water intake 2. hypercholesterolemia treated with careful diet 3. osteoporosis and a vitamin D deficiency 4. tachycardia palpitations controlled with beta blocker 5. remote history of TIA without recurrent symptoms  Plan: Continue same medication.  She has been very busy taking care of of a friend who is in invalid.  She is trying to get him into the [facility] system to help with obtaining more care for him.

## 2014-12-16 ENCOUNTER — Other Ambulatory Visit: Payer: Medicare Other

## 2015-04-23 ENCOUNTER — Other Ambulatory Visit (INDEPENDENT_AMBULATORY_CARE_PROVIDER_SITE_OTHER): Payer: PPO | Admitting: *Deleted

## 2015-04-23 DIAGNOSIS — D649 Anemia, unspecified: Secondary | ICD-10-CM

## 2015-04-23 LAB — IBC PANEL
Iron: 94 ug/dL (ref 42–145)
Saturation Ratios: 27.9 % (ref 20.0–50.0)
Transferrin: 241 mg/dL (ref 212.0–360.0)

## 2015-04-23 LAB — VITAMIN B12: Vitamin B-12: 287 pg/mL (ref 211–911)

## 2015-04-23 LAB — FERRITIN: FERRITIN: 32.3 ng/mL (ref 10.0–291.0)

## 2015-04-23 LAB — FOLATE: Folate: >23.3 ng/mL (ref >5.9)

## 2015-04-23 NOTE — Progress Notes (Signed)
Quick Note:  Please make copy of labs for patient visit. ______ 

## 2015-04-29 ENCOUNTER — Ambulatory Visit (INDEPENDENT_AMBULATORY_CARE_PROVIDER_SITE_OTHER): Payer: PPO | Admitting: Cardiology

## 2015-04-29 ENCOUNTER — Encounter: Payer: Self-pay | Admitting: Cardiology

## 2015-04-29 VITALS — BP 130/70 | HR 55 | Ht 64.0 in | Wt 92.8 lb

## 2015-04-29 DIAGNOSIS — E7800 Pure hypercholesterolemia, unspecified: Secondary | ICD-10-CM

## 2015-04-29 DIAGNOSIS — E559 Vitamin D deficiency, unspecified: Secondary | ICD-10-CM | POA: Diagnosis not present

## 2015-04-29 DIAGNOSIS — R002 Palpitations: Secondary | ICD-10-CM | POA: Diagnosis not present

## 2015-04-29 LAB — BASIC METABOLIC PANEL
BUN: 20 mg/dL (ref 6–23)
CALCIUM: 9.4 mg/dL (ref 8.4–10.5)
CO2: 31 mEq/L (ref 19–32)
Chloride: 104 mEq/L (ref 96–112)
Creatinine, Ser: 0.75 mg/dL (ref 0.40–1.20)
GFR: 79.57 mL/min (ref >60.00)
Glucose, Bld: 75 mg/dL (ref 70–99)
POTASSIUM: 3.7 meq/L (ref 3.5–5.1)
SODIUM: 139 meq/L (ref 135–145)

## 2015-04-29 LAB — HEPATIC FUNCTION PANEL
ALK PHOS: 78 U/L (ref 39–117)
ALT: 36 U/L — ABNORMAL HIGH (ref 0–35)
AST: 36 U/L (ref 0–37)
Albumin: 4.3 g/dL (ref 3.5–5.2)
Bilirubin, Direct: 0.1 mg/dL (ref 0.0–0.3)
Total Bilirubin: 0.7 mg/dL (ref 0.2–1.2)
Total Protein: 7.1 g/dL (ref 6.0–8.3)

## 2015-04-29 LAB — LIPID PANEL
CHOLESTEROL: 217 mg/dL — AB (ref 0–200)
HDL: 82 mg/dL (ref >39.00)
LDL CALC: 121 mg/dL — AB (ref 0–99)
NonHDL: 135
TRIGLYCERIDES: 69 mg/dL (ref 0.0–149.0)
Total CHOL/HDL Ratio: 3
VLDL: 13.8 mg/dL (ref 0.0–40.0)

## 2015-04-29 NOTE — Patient Instructions (Signed)
Medication Instructions:  Your physician recommends that you continue on your current medications as directed. Please refer to the Current Medication list given to you today.  Labwork: LP/BMET/HFP  Testing/Procedures: NONE  Follow-Up: Your physician recommends that you schedule a follow-up appointment in: 4 MONTH OV  Any Other Special Instructions Will Be Listed Below (If Applicable).

## 2015-04-29 NOTE — Progress Notes (Signed)
Cardiology Office Note   Date:  04/29/2015   ID:  Samantha Gregory, Samantha Gregory Jul 19, 1935, MRN [id number]  PCP:  Warren Danes, MD  Cardiologist: Darlin Coco MD  Chief Complaint  Patient presents with  . Labs Only      History of Present Illness: Samantha Gregory is a 80 y.o. female who presents for a four-month follow-up office visit  This 80 year old woman is seen for a scheduled followup office visit. She previously had been having episodes of dizziness when she stands up. At her last visit we had her increase her salt and water intake. She states that since she has done that she has had no further dizziness. She has a past history of palpitations and hypercholesterolemia. She is not presently on any statin therapy. In 2001 she had a questionable TIA and had an echocardiogram with bubble study which did not show any embolic source. She has had previous carotid Dopplers which are normal. The patient is no longer on statins. She has tried most of the statins. She had to go off the statins because it was causing her to have decreased memory. Fortunately her HDL level is satisfactory and her risk ratio is not excessive.  She has a history of occasional PVCs. She drinks coffee in the morning but not the rest of the day. She has been under a lot of stress.  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  She is running herself ragged.  She feels exhausted a great deal of the time.  Past Medical History  Diagnosis Date  . Hypercholesterolemia   . Heart palpitations   . Mitral regurgitation   . Paroxysmal atrial tachycardia     Past Surgical History  Procedure Laterality Date  . Appendectomy       Current Outpatient Prescriptions  Medication Sig Dispense Refill  . aspirin 81 MG tablet Take 1 tablet (81 mg total) by mouth every other day.    . cholecalciferol (VITAMIN D) 1000 UNITS tablet Take 1,000 Units by mouth daily.    . metoprolol tartrate  (LOPRESSOR) 25 MG tablet Take 1 tablet (25 mg total) by mouth 2 (two) times daily as needed (Use as needed for rapid heart beat.). 60 tablet 11  . Multiple Vitamins-Iron (MULTIVITAMIN/IRON PO) Take by mouth daily.     No current facility-administered medications for this visit.    Allergies:   Statins    Social History:  The patient  reports that she has never smoked. She does not have any smokeless tobacco history on file.   Family History:  The patient's family history includes Heart attack in her father; Kidney disease in her mother; Stroke in her mother, paternal aunt, and paternal uncle.    ROS:  Please see the history of present illness.   Otherwise, review of systems are positive for none.   All other systems are reviewed and negative.    PHYSICAL EXAM: VS:  BP 130/70 mmHg  Pulse 55  Ht 5\' 4"  (1.626 m)  Wt 92 lb 12.8 oz (42.094 kg)  BMI 15.92 kg/m2 , BMI Body mass index is 15.92 kg/(m^2). GEN: Well nourished, well developed, in no acute distress HEENT: normal Neck: no JVD, carotid bruits, or masses Cardiac: RRR; no murmurs, rubs, or gallops,no edema  Respiratory:  clear to auscultation bilaterally, normal work of breathing GI: soft, nontender, nondistended, + BS MS: no deformity or atrophy Skin: warm and dry, no rash Neuro:  Strength and sensation  are intact Psych: euthymic mood, full affect   EKG:  EKG is not ordered today.    Recent Labs: 12/04/2014: ALT 22; BUN 20; Creatinine 0.8; Potassium 3.7; Sodium 138    Lipid Panel    Component Value Date/Time   CHOL 241* 12/04/2014 0732   TRIG 72.0 12/04/2014 0732   HDL 88.70 12/04/2014 0732   CHOLHDL 3 12/04/2014 0732   VLDL 14.4 12/04/2014 0732   LDLCALC 138* 12/04/2014 0732   LDLDIRECT 117.0 02/05/2014 0812      Wt Readings from Last 3 Encounters:  04/29/15 92 lb 12.8 oz (42.094 kg)  12/11/14 96 lb (43.545 kg)  08/21/14 94 lb (42.638 kg)       ASSESSMENT AND PLAN:  1. orthostatic hypotension and  dizziness, resolved by increasing salt and water intake 2. hypercholesterolemia treated with careful diet 3. osteoporosis and a vitamin D deficiency 4. tachycardia palpitations controlled with beta blocker 5. remote history of TIA without recurrent symptoms  6.  Situational stress and exhaustion 7.  History of anemia.  Recent iron/anemia profile was normal on 04/23/15   Current medicines are reviewed at length with the patient today.  The patient does not have concerns regarding medicines.  The following changes have been made:  no change  Labs/ tests ordered today include: Lipid panel hepatic function panel and basal metabolic panel   Orders Placed This Encounter  Procedures  . Lipid panel  . Hepatic function panel  . Basic metabolic panel     Disposition.  Continue current medication.  Try to get more rest.  Recheck in 4 months for office visit.  Berna Spare MD 04/29/2015 1:18 PM    [facility] [place], [place], [place]  [address] Phone: [phone]; Fax: [phone]

## 2015-04-29 NOTE — Progress Notes (Signed)
Quick Note:  Please report to patient. The recent labs are stable. Continue same medication and careful diet. ______ 

## 2015-05-01 ENCOUNTER — Telehealth: Payer: Self-pay | Admitting: Cardiology

## 2015-05-01 NOTE — Telephone Encounter (Signed)
New Message    Patient is calling back to get results. Please give patient a call back.

## 2015-05-01 NOTE — Telephone Encounter (Signed)
-----   Message from Darlin Coco, MD sent at 04/29/2015  7:24 PM EDT ----- Please report to patient.  The recent labs are stable. Continue same medication and careful diet.

## 2015-05-01 NOTE — Telephone Encounter (Signed)
Advised patient of lab results  

## 2015-08-07 ENCOUNTER — Telehealth: Payer: Self-pay | Admitting: Cardiology

## 2015-08-07 NOTE — Telephone Encounter (Signed)
Advised patient She does not want to go to urgent care, recommenced Will follow up with her next week to see how she is doing

## 2015-08-07 NOTE — Telephone Encounter (Signed)
Our schedule is already slammed for today. No way I could see her. She may want to go to urgent care if her symptoms are severe. CP likely noncardiac.

## 2015-08-07 NOTE — Telephone Encounter (Signed)
Spoke with patient and she has been having chest pains and shortness of breath with exertion for last couple of weeks States she feels this is heart related  Patient is under a lot of stress taking care of a friend in addition to taking care of her home Patient wants appointment with  Dr. Mare Ferrari only Will forward for review

## 2015-08-07 NOTE — Telephone Encounter (Signed)
New Message (No dot phrase)  Per pt call states that she is experiencing Fatigue, pain and gas. This morning she reports that she took a ginger ale to get rid of the Gas. She states that she is working to hard and she believes that it has caught up with her. req a calll back to determine if her symptoms require a same day app. Please call.

## 2015-08-18 ENCOUNTER — Telehealth: Payer: Self-pay | Admitting: Cardiology

## 2015-08-18 DIAGNOSIS — R002 Palpitations: Secondary | ICD-10-CM

## 2015-08-18 MED ORDER — METOPROLOL TARTRATE 25 MG PO TABS: 25.0000 mg | ORAL_TABLET | Freq: Two times a day (BID) | ORAL | Status: DC | PRN

## 2015-08-18 NOTE — Telephone Encounter (Signed)
Patient has appointment Friday to see  Dr. Mare Ferrari  Advised to call back if any further problems

## 2015-08-18 NOTE — Telephone Encounter (Signed)
Spoke with with pt , she states that she requested for [facility] to call her.

## 2015-08-18 NOTE — Telephone Encounter (Signed)
Spoke with patient and scheduled ov for later this week Patient pleased with the appointment and just wanted to bee seen before ov 09/02/15

## 2015-08-18 NOTE — Telephone Encounter (Signed)
New message  Pt has no energy  Pt having SOB Pt states she thought she was going to black out  Pt c/o Shortness Of Breath: STAT if SOB developed within the last 24 hours or pt is noticeably SOB on the phone  1. Are you currently SOB (can you hear that pt is SOB on the phone)? Yes all day  2. How long have you been experiencing SOB? On and off for 3 weeks  3. Are you SOB when sitting or when up moving around? A little of both  4. Are you currently experiencing any other symptoms? Pt has no energy;

## 2015-08-19 ENCOUNTER — Telehealth: Payer: Self-pay | Admitting: Internal Medicine

## 2015-08-19 NOTE — Telephone Encounter (Deleted)
New Message  Pt called request a call back to discuss family history that she has stumbled across. Request a call back to discuss in detail

## 2015-08-19 NOTE — Telephone Encounter (Deleted)
ERROR

## 2015-08-19 NOTE — Telephone Encounter (Signed)
Error. Wrong pt//sr

## 2015-08-21 ENCOUNTER — Ambulatory Visit
Admission: RE | Admit: 2015-08-21 | Discharge: 2015-08-21 | Disposition: A | Discharge status: Home / Self Care | Payer: PPO | Source: Ambulatory Visit | Attending: Cardiology | Admitting: Cardiology

## 2015-08-21 ENCOUNTER — Encounter: Payer: Self-pay | Admitting: Cardiology

## 2015-08-21 ENCOUNTER — Ambulatory Visit (INDEPENDENT_AMBULATORY_CARE_PROVIDER_SITE_OTHER): Payer: PPO | Admitting: Cardiology

## 2015-08-21 VITALS — BP 160/90 | HR 55 | Ht 64.0 in | Wt 92.8 lb

## 2015-08-21 DIAGNOSIS — R42 Dizziness and giddiness: Secondary | ICD-10-CM

## 2015-08-21 DIAGNOSIS — R0609 Other forms of dyspnea: Principal | ICD-10-CM

## 2015-08-21 DIAGNOSIS — R0600 Dyspnea, unspecified: Secondary | ICD-10-CM

## 2015-08-21 DIAGNOSIS — R002 Palpitations: Secondary | ICD-10-CM | POA: Diagnosis not present

## 2015-08-21 DIAGNOSIS — E7800 Pure hypercholesterolemia, unspecified: Secondary | ICD-10-CM

## 2015-08-21 LAB — LIPID PANEL
Cholesterol: 218 mg/dL — ABNORMAL HIGH (ref 0–200)
HDL: 82.6 mg/dL (ref >39.00)
LDL Cholesterol: 117 mg/dL — ABNORMAL HIGH (ref 0–99)
NONHDL: 135.4
Total CHOL/HDL Ratio: 3
Triglycerides: 91 mg/dL (ref 0.0–149.0)
VLDL: 18.2 mg/dL (ref 0.0–40.0)

## 2015-08-21 LAB — HEPATIC FUNCTION PANEL
ALBUMIN: 4.2 g/dL (ref 3.5–5.2)
ALK PHOS: 70 U/L (ref 39–117)
ALT: 18 U/L (ref 0–35)
AST: 26 U/L (ref 0–37)
BILIRUBIN TOTAL: 0.7 mg/dL (ref 0.2–1.2)
Bilirubin, Direct: 0.1 mg/dL (ref 0.0–0.3)
Total Protein: 7 g/dL (ref 6.0–8.3)

## 2015-08-21 LAB — CBC WITH DIFFERENTIAL/PLATELET
BASOS ABS: 0 10*3/uL (ref 0.0–0.1)
Basophils Relative: 0.5 % (ref 0.0–3.0)
EOS ABS: 0 10*3/uL (ref 0.0–0.7)
Eosinophils Relative: 0.9 % (ref 0.0–5.0)
HCT: 37.5 % (ref 36.0–46.0)
Hemoglobin: 12.6 g/dL (ref 12.0–15.0)
LYMPHS ABS: 1 10*3/uL (ref 0.7–4.0)
Lymphocytes Relative: 21.8 % (ref 12.0–46.0)
MCHC: 33.5 g/dL (ref 30.0–36.0)
MCV: 89.1 fl (ref 78.0–100.0)
MONO ABS: 0.3 10*3/uL (ref 0.1–1.0)
Monocytes Relative: 5.7 % (ref 3.0–12.0)
NEUTROS ABS: 3.2 10*3/uL (ref 1.4–7.7)
NEUTROS PCT: 71.1 % (ref 43.0–77.0)
PLATELETS: 196 10*3/uL (ref 150.0–400.0)
RBC: 4.21 Mil/uL (ref 3.87–5.11)
RDW: 14.2 % (ref 11.5–15.5)
WBC: 4.5 10*3/uL (ref 4.0–10.5)

## 2015-08-21 LAB — BASIC METABOLIC PANEL
BUN: 20 mg/dL (ref 6–23)
CALCIUM: 9.3 mg/dL (ref 8.4–10.5)
CO2: 31 meq/L (ref 19–32)
CREATININE: 0.76 mg/dL (ref 0.40–1.20)
Chloride: 102 mEq/L (ref 96–112)
GFR: 78.3 mL/min (ref >60.00)
GLUCOSE: 79 mg/dL (ref 70–99)
Potassium: 3.8 mEq/L (ref 3.5–5.1)
Sodium: 138 mEq/L (ref 135–145)

## 2015-08-21 NOTE — Progress Notes (Signed)
Quick Note:  Please report to patient. The recent labs are stable. Continue same medication and careful diet. ______ 

## 2015-08-21 NOTE — Progress Notes (Signed)
Cardiology Office Note   Date:  08/21/2015   ID:  Lanaiya, Lantry Feb 03, 1935, MRN [id number]  PCP:  Warren Danes, MD  Cardiologist: Darlin Coco MD  No chief complaint on file.     History of Present Illness: Samantha Gregory is a 80 y.o. female who presents for a work in office visit. . She previously had been having episodes of dizziness when she stands up. At her last visit we had her increase her salt and water intake. She states that since she has done that she has had no further dizziness. She has a past history of palpitations and hypercholesterolemia. She is not presently on any statin therapy. In 2001 she had a questionable TIA and had an echocardiogram with bubble study which did not show any embolic source. She has had previous carotid Dopplers which are normal. The patient is no longer on statins. She has tried most of the statins. She had to go off the statins because it was causing her to have decreased memory. Fortunately her HDL level is satisfactory and her risk ratio is not excessive.  She has a history of occasional PVCs. She drinks coffee in the morning but not the rest of the day. She has been under a lot of stress. 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 She is running herself ragged. She feels exhausted a great deal of the time.  The elderly gentleman is now a resident of [facility].  However, Samantha Gregory continues to go there are 3 times a day and feeds him and bathes him etc. The patient has had increased exertional dyspnea.  She is unable to lie on her left side at night because she cannot breathe as well.  She sleeps on one pillow.  She has had some tachycardia.  Her last chest x-ray was in 2011.   Past Medical History  Diagnosis Date  . Hypercholesterolemia   . Heart palpitations   . Mitral regurgitation   . Paroxysmal atrial tachycardia     Past Surgical History  Procedure Laterality Date    . Appendectomy       Current Outpatient Prescriptions  Medication Sig Dispense Refill  . aspirin 81 MG tablet Take 1 tablet (81 mg total) by mouth every other day.    . cholecalciferol (VITAMIN D) 1000 UNITS tablet Take 1,000 Units by mouth daily.    . metoprolol tartrate (LOPRESSOR) 25 MG tablet Take 1 tablet (25 mg total) by mouth 2 (two) times daily as needed (Use as needed for rapid heart beat.). 60 tablet 3  . Multiple Vitamins-Iron (MULTIVITAMIN/IRON PO) Take by mouth daily.     No current facility-administered medications for this visit.    Allergies:   Statins    Social History:  The patient  reports that she has never smoked. She does not have any smokeless tobacco history on file.   Family History:  The patient's family history includes Heart attack in her father; Kidney disease in her mother; Stroke in her mother, paternal aunt, and paternal uncle.    ROS:  Please see the history of present illness.   Otherwise, review of systems are positive for none.   All other systems are reviewed and negative.    PHYSICAL EXAM: VS:  BP 160/90 mmHg  Pulse 55  Ht 5\' 4"  (1.626 m)  Wt 92 lb 12.8 oz (42.094 kg)  BMI 15.92 kg/m2  SpO2 99% , BMI Body mass  index is 15.92 kg/(m^2). GEN: Well nourished, well developed, in no acute distress HEENT: normal Neck: no JVD, carotid bruits, or masses Cardiac: RRR; there is a soft murmur of aortic insufficiency.  There is a distinct left ventricular heave at the apex.  I checked orthostatic blood pressures which were normal. Respiratory:  clear to auscultation bilaterally, normal work of breathing GI: soft, nontender, nondistended, + BS MS: no deformity or atrophy Skin: warm and dry, no rash Neuro:  Strength and sensation are intact Psych: euthymic mood, full affect   EKG:  EKG is ordered today. The ekg ordered today demonstrates left ventricular hypertrophy   Recent Labs: 04/29/2015: ALT 36*; BUN 20; Creatinine, Ser 0.75; Potassium 3.7;  Sodium 139    Lipid Panel    Component Value Date/Time   CHOL 217* 04/29/2015 1223   TRIG 69.0 04/29/2015 1223   HDL 82.00 04/29/2015 1223   CHOLHDL 3 04/29/2015 1223   VLDL 13.8 04/29/2015 1223   LDLCALC 121* 04/29/2015 1223   LDLDIRECT 117.0 02/05/2014 0812      Wt Readings from Last 3 Encounters:  08/21/15 92 lb 12.8 oz (42.094 kg)  04/29/15 92 lb 12.8 oz (42.094 kg)  12/11/14 96 lb (43.545 kg)        ASSESSMENT AND PLAN: 1. orthostatic hypotension and dizziness, resolved by increasing salt and water intake 2. hypercholesterolemia treated with careful diet 3. osteoporosis and a vitamin D deficiency 4. tachycardia palpitations controlled with beta blocker 5. remote history of TIA without recurrent symptoms  6. Situational stress and exhaustion 7. History of anemia. Recent iron/anemia profile was normal on 04/23/15 8.  Ventricular hypertrophy.  Rule out significant aortic insufficiency.  The left ventricle appears to be enlarged on physical exam today left ventricular left at the anterior axillary line  Current medicines are reviewed at length with the patient today.  The patient does not have concerns regarding medicines.  The following changes have been made:  no change  Labs/ tests ordered today include:   Orders Placed This Encounter  Procedures  . DG Chest 2 View  . Lipid panel  . Hepatic function panel  . Basic metabolic panel  . CBC with Differential/Platelet  . EKG 12-Lead  . ECHOCARDIOGRAM COMPLETE    Physician: We are going to update her echo and her chest x-ray.  We will get blood work today including CBC and fasting labs.  She will keep her September appointment. He definitely needs to cut back on her excessive caregiving of her friend now that he is in a nursing home and allow the nursing home staff to feed him and base and etc. she is wearing herself out.  Berna Spare MD 08/21/2015 9:42 AM    [facility] [place], [place], [place]  [address] Phone: [phone]; Fax: [phone]

## 2015-08-21 NOTE — Patient Instructions (Signed)
Medication Instructions:  Your physician recommends that you continue on your current medications as directed. Please refer to the Current Medication list given to you today.  Labwork: LP/BMET/HFP/CBC  Testing/Procedures: Your physician has requested that you have an echocardiogram. Echocardiography is a painless test that uses sound waves to create images of your heart. It provides your doctor with information about the size and shape of your heart and how well your heart's chambers and valves are working. This procedure takes approximately one hour. There are no restrictions for this procedure.  A chest x-ray takes a picture of the organs and structures inside the chest, including the heart, lungs, and blood vessels. This test can show several things, including, whether the heart is enlarges; whether fluid is building up in the lungs; and whether pacemaker / defibrillator leads are still in place. [place]   Follow-Up: KEEP YOUR September APPOINTMENT   Any Other Special Instructions Will Be Listed Below (If Applicable).

## 2015-08-28 ENCOUNTER — Telehealth: Payer: Self-pay | Admitting: Cardiology

## 2015-08-28 ENCOUNTER — Other Ambulatory Visit (HOSPITAL_COMMUNITY): Payer: PPO

## 2015-08-28 ENCOUNTER — Other Ambulatory Visit: Payer: Self-pay

## 2015-08-28 ENCOUNTER — Ambulatory Visit (HOSPITAL_COMMUNITY): Payer: PPO | Attending: Cardiology

## 2015-08-28 DIAGNOSIS — R0609 Other forms of dyspnea: Secondary | ICD-10-CM | POA: Insufficient documentation

## 2015-08-28 DIAGNOSIS — I351 Nonrheumatic aortic (valve) insufficiency: Secondary | ICD-10-CM | POA: Diagnosis not present

## 2015-08-28 DIAGNOSIS — R0600 Dyspnea, unspecified: Secondary | ICD-10-CM

## 2015-08-28 DIAGNOSIS — I340 Nonrheumatic mitral (valve) insufficiency: Secondary | ICD-10-CM | POA: Insufficient documentation

## 2015-08-28 DIAGNOSIS — E785 Hyperlipidemia, unspecified: Secondary | ICD-10-CM | POA: Insufficient documentation

## 2015-08-28 NOTE — Telephone Encounter (Signed)
New message    Pt received letter from hospital stating insurance has approved the "Tte-w doppler complete test" Pt wanting to know if this is to be done before she sees Dr Mare Ferrari this upcoming week

## 2015-08-28 NOTE — Telephone Encounter (Signed)
-----   Message from Darlin Coco, MD sent at 08/28/2015  4:37 PM EDT ----- Please report.  The echo shows moderate to severe aortic valve regurgitation.  I would like her to have a TEE to evaluate further. This is what is causing her increased shortness of breath and her inability to lie on her left side at night.

## 2015-08-28 NOTE — Telephone Encounter (Signed)
Advised patient  Patient wanted to know if she should keep her appointment with  Dr. Mare Ferrari 09/02/15 or reschedule until after TEE Will forward to  Dr. Mare Ferrari for review

## 2015-08-30 NOTE — Telephone Encounter (Signed)
Keep appointment Sept 7

## 2015-09-01 NOTE — Telephone Encounter (Signed)
Left message at her request to keep appointment

## 2015-09-02 ENCOUNTER — Ambulatory Visit (INDEPENDENT_AMBULATORY_CARE_PROVIDER_SITE_OTHER): Payer: PPO | Admitting: Cardiology

## 2015-09-02 ENCOUNTER — Encounter: Payer: Self-pay | Admitting: Cardiology

## 2015-09-02 VITALS — BP 132/74 | HR 77 | Ht 64.0 in | Wt 92.4 lb

## 2015-09-02 DIAGNOSIS — R42 Dizziness and giddiness: Secondary | ICD-10-CM | POA: Diagnosis not present

## 2015-09-02 DIAGNOSIS — I351 Nonrheumatic aortic (valve) insufficiency: Secondary | ICD-10-CM | POA: Diagnosis not present

## 2015-09-02 DIAGNOSIS — I10 Essential (primary) hypertension: Secondary | ICD-10-CM

## 2015-09-02 HISTORY — DX: Nonrheumatic aortic (valve) insufficiency: I35.1

## 2015-09-02 MED ORDER — LOSARTAN POTASSIUM 25 MG PO TABS
25.0000 mg | ORAL_TABLET | Freq: Every day | ORAL | Status: DC
Start: 2015-09-02 — End: 2016-01-01

## 2015-09-02 NOTE — Progress Notes (Signed)
Cardiology Office Note   Date:  09/02/2015   ID:  Samantha Gregory, Samantha Gregory [birth year], MRN [id number]  PCP:  Warren Danes, MD  Cardiologist: Darlin Coco MD  No chief complaint on file.     History of Present Illness: Samantha Gregory is a 80 y.o. female who presents for follow-up office visit.  The patient has a previous history of orthostatic hypotension.  This has improved with liberalization of salt and fluid intake.  She was recently seen complaining of shortness of breath.  She had an echocardiogram on 08/28/15 which showed moderate aortic insufficiency.    In 2001 she had a questionable TIA and had an echocardiogram with bubble study which did not show any embolic source. She has had previous carotid Dopplers which are normal. The patient is no longer on statins. She has tried most of the statins. She had to go off the statins because it was causing her to have decreased memory. Fortunately her HDL level is satisfactory and her risk ratio is not excessive.  She has a history of occasional PVCs. She drinks coffee in the morning but not the rest of the day. She has been under a lot of stress. 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 She is running herself ragged. She feels exhausted a great deal of the time. The elderly gentleman is now a resident of [facility]. However, Samantha Gregory continues to go there are 3 times a day and feeds him and bathes him etc. The patient has had increased exertional dyspnea. She is unable to lie on her left side at night because she cannot breathe as well. She sleeps on one pillow. She has had some tachycardia. Her last chest x-ray was in 2011. The patient underwent a transthoracic echocardiogram on 08/28/15 which demonstrated; Normal LV size with low normal to mildly reduced systolic function, EF 87-56%. Normal RV size and systolic function. Trileaflet aortic valve with significant aortic  regurgitation. In the apical three chamber view, the aortic regurgitation appeared moderate to severe. However, a short pressure halftime of the AR signal was never obtained. There was diastolic flow reversal in the descending thoracic aorta doppler signal but it did not appear holodiastolic. Would consider TEE if patient is symptomatic.  The patient sleeps on one pillow.  She denies excessive dyspnea.  She does complain of lack of energy.  She has not been having any chest pain.  Blood pressure is elevated today. Past Medical History  Diagnosis Date  . Hypercholesterolemia   . Heart palpitations   . Mitral regurgitation   . Paroxysmal atrial tachycardia     Past Surgical History  Procedure Laterality Date  . Appendectomy       Current Outpatient Prescriptions  Medication Sig Dispense Refill  . aspirin 81 MG tablet Take 1 tablet (81 mg total) by mouth every other day.    . cholecalciferol (VITAMIN D) 1000 UNITS tablet Take 1,000 Units by mouth daily.    Marland Kitchen losartan (COZAAR) 25 MG tablet Take 1 tablet (25 mg total) by mouth daily. 30 tablet 5  . metoprolol tartrate (LOPRESSOR) 25 MG tablet Take 1 tablet (25 mg total) by mouth 2 (two) times daily as needed (Use as needed for rapid heart beat.). 60 tablet 3  . Multiple Vitamins-Iron (MULTIVITAMIN/IRON PO) Take 1 tablet by mouth daily.      No current facility-administered medications for this visit.    Allergies:   Statins  Social History:  The patient  reports that she has never smoked. She does not have any smokeless tobacco history on file. She reports that she drinks alcohol.   Family History:  The patient's family history includes Heart attack in her father; Heart disease in her brother, sister, and sister; Hypertension in her brother; Kidney disease in her mother; Stroke in her mother, paternal aunt, and paternal uncle.    ROS:  Please see the history of present illness.   Otherwise, review of systems are  positive for none.   All other systems are reviewed and negative.    PHYSICAL EXAM: VS:  BP 132/74 mmHg  Pulse 77  Ht 5\' 4"  (1.626 m)  Wt 92 lb 6.4 oz (41.912 kg)  BMI 15.85 kg/m2  SpO2 99% , BMI Body mass index is 15.85 kg/(m^2). GEN: Well nourished, well developed, in no acute distress HEENT: normal Neck: no JVD, carotid bruits, or masses Cardiac: RRR; there is a faint murmur of aortic insufficiency at the left sternal edge.  There is a prominent left ventricular lift at the anterior axillary line. Respiratory:  clear to auscultation bilaterally, normal work of breathing GI: soft, nontender, nondistended, + BS MS: no deformity or atrophy Skin: warm and dry, no rash Neuro:  Strength and sensation are intact Psych: euthymic mood, full affect   EKG:  EKG is not ordered today.    Recent Labs: 08/21/2015: ALT 18; BUN 20; Creatinine, Ser 0.76; Hemoglobin 12.6; Platelets 196.0; Potassium 3.8; Sodium 138    Lipid Panel    Component Value Date/Time   CHOL 218* 08/21/2015 0932   TRIG 91.0 08/21/2015 0932   HDL 82.60 08/21/2015 0932   CHOLHDL 3 08/21/2015 0932   VLDL 18.2 08/21/2015 0932   LDLCALC 117* 08/21/2015 0932   LDLDIRECT 117.0 02/05/2014 0812      Wt Readings from Last 3 Encounters:  09/02/15 92 lb 6.4 oz (41.912 kg)  08/21/15 92 lb 12.8 oz (42.094 kg)  04/29/15 92 lb 12.8 oz (42.094 kg)        ASSESSMENT AND PLAN:  1. orthostatic hypotension and dizziness, resolved by increasing salt and water intake.  She is now hypertensive. 2. hypercholesterolemia treated with careful diet 3. osteoporosis and a vitamin D deficiency 4. tachycardia palpitations controlled with beta blocker 5. remote history of TIA without recurrent symptoms  6. Situational stress and exhaustion 7. History of anemia. Recent iron/anemia profile was normal on 04/23/15 8. Moderate to severe aortic insufficiency.   Current medicines are reviewed at length with the patient today.  The  patient does not have concerns regarding medicines.  The following changes have been made:  Begin losartan 25 mg one daily  Labs/ tests ordered today include:   Orders Placed This Encounter  Procedures  . Basic metabolic panel     Disposition: We will proceed with the TEE to evaluate the severity of her aortic valve.  Visually the echo suggested moderate to severe aortic insufficiency, although the pressure half-time which was obtained was not short.  Clinically she is not having any symptoms of CHF at this point.  We are going to start her on losartan for afterload reduction of her systolic hypertension. She needs to cut back on her involvement with her friend in the nursing home.  She should avoid heavy lifting and straining which she currently is doing. We will check a basal metabolic panel in 2 weeks.  Return in 2 months for office visit  Signed, Darlin Coco MD 09/02/2015  8:36 AM    [facility] [address], [place], [place]  [address] Phone: [phone]; Fax: [phone]

## 2015-09-02 NOTE — Patient Instructions (Addendum)
Medication Instructions:  START LOSARTAN 25 MG ONE DAILY   Labwork: BMET IN 2 WEEKS  Testing/Procedures: Your physician has requested that you have a TEE. During a TEE, sound waves are used to create images of your heart. It provides your doctor with information about the size and shape of your heart and how well your heart's chambers and valves are working. In this test, a transducer is attached to the end of a flexible tube that's guided down your throat and into your esophagus (the tube leading from you mouth to your stomach) to get a more detailed image of your heart. You are not awake for the procedure. Please see the instruction sheet given to you today. For further information please visit [email or web address].  You are scheduled for a TEE on 09/08/15 with Dr. Stanford Breed or associate.  ARRIVE AT 6:30 AM  Enter thru the [facility] entrance A No food or drink after midnight the night before  You may take your medications with a sip of water on the day of your procedure.    Follow-Up: Your physician recommends that you schedule a follow-up appointment in: 2 MONTH OV  Any Other Special Instructions Will Be Listed Below (If Applicable). Decrease your salt intake  Transesophageal Echocardiogram Transesophageal echocardiography (TEE) is a picture test of your heart using sound waves. The pictures taken can give very detailed pictures of your heart. This can help your doctor see if there are problems with your heart. TEE can check:  If your heart has blood clots in it.  How well your heart valves are working.  If you have an infection on the inside of your heart.  Some of the major arteries of your heart.  If your heart valve is working after a Office manager.  Your heart before a procedure that uses a shock to your heart to get the rhythm back to normal. BEFORE THE PROCEDURE  Do not eat or drink for 6 hours before the procedure or as told by your doctor.  Make plans to have someone drive  you home after the procedure. Do not drive yourself home.  An IV tube will be put in your arm. PROCEDURE  You will be given a medicine to help you relax (sedative). It will be given through the IV tube.  A numbing medicine will be sprayed or gargled in the back of your throat to help numb it.  The tip of the probe is placed into the back of your mouth. You will be asked to swallow. This helps to pass the probe into your esophagus.  Once the tip of the probe is in the right place, your doctor can take pictures of your heart.  You may feel pressure at the back of your throat. AFTER THE PROCEDURE  You will be taken to a recovery area so the sedative can wear off.  Your throat may be sore and scratchy. This will go away slowly over time.  You will go home when you are fully awake and able to swallow liquids.  You should have someone stay with you for the next 24 hours.  Do not drive or operate machinery for the next 24 hours. Document Released: 10/09/2009 Document Revised: 12/17/2013 Document Reviewed: 06/13/2013 [facility] Patient Information 2015 [place], [place]. This information is not intended to replace advice given to you by your health care provider. Make sure you discuss any questions you have with your health care provider.

## 2015-09-04 ENCOUNTER — Other Ambulatory Visit: Payer: Self-pay | Admitting: Cardiology

## 2015-09-04 DIAGNOSIS — I351 Nonrheumatic aortic (valve) insufficiency: Secondary | ICD-10-CM

## 2015-09-08 ENCOUNTER — Ambulatory Visit (HOSPITAL_COMMUNITY)
Admission: RE | Admit: 2015-09-08 | Discharge: 2015-09-08 | Disposition: A | Discharge status: Home / Self Care | Payer: PPO | Source: Ambulatory Visit | Attending: Cardiology | Admitting: Cardiology

## 2015-09-08 ENCOUNTER — Encounter (HOSPITAL_COMMUNITY): Payer: Self-pay | Admitting: *Deleted

## 2015-09-08 ENCOUNTER — Encounter (HOSPITAL_COMMUNITY)
Admission: RE | Disposition: A | Discharge status: Home / Self Care | Payer: Self-pay | Source: Ambulatory Visit | Attending: Cardiology

## 2015-09-08 ENCOUNTER — Ambulatory Visit (HOSPITAL_BASED_OUTPATIENT_CLINIC_OR_DEPARTMENT_OTHER)
Admission: RE | Admit: 2015-09-08 | Discharge: 2015-09-08 | Disposition: A | Discharge status: Home / Self Care | Payer: PPO | Source: Ambulatory Visit | Attending: Cardiology | Admitting: Cardiology

## 2015-09-08 DIAGNOSIS — I351 Nonrheumatic aortic (valve) insufficiency: Secondary | ICD-10-CM

## 2015-09-08 HISTORY — PX: TEE WITHOUT CARDIOVERSION: SHX5443

## 2015-09-08 SURGERY — ECHOCARDIOGRAM, TRANSESOPHAGEAL
Anesthesia: Moderate Sedation

## 2015-09-08 MED ORDER — MIDAZOLAM HCL 5 MG/ML IJ SOLN
INTRAMUSCULAR | Status: AC
  Filled 2015-09-08: qty 2

## 2015-09-08 MED ORDER — DIPHENHYDRAMINE HCL 50 MG/ML IJ SOLN
INTRAMUSCULAR | Status: AC
  Filled 2015-09-08: qty 1

## 2015-09-08 MED ORDER — FENTANYL CITRATE (PF) 100 MCG/2ML IJ SOLN
INTRAMUSCULAR | Status: DC | PRN
  Administered 2015-09-08 (×2): 25 ug via INTRAVENOUS

## 2015-09-08 MED ORDER — FENTANYL CITRATE (PF) 100 MCG/2ML IJ SOLN
INTRAMUSCULAR | Status: AC
  Filled 2015-09-08: qty 2

## 2015-09-08 MED ORDER — SODIUM CHLORIDE 0.9 % IV SOLN
INTRAVENOUS | Status: DC
  Administered 2015-09-08: 500 mL via INTRAVENOUS

## 2015-09-08 MED ORDER — MIDAZOLAM HCL 10 MG/2ML IJ SOLN
INTRAMUSCULAR | Status: DC | PRN
  Administered 2015-09-08 (×2): 2 mg via INTRAVENOUS

## 2015-09-08 NOTE — Progress Notes (Signed)
  Echocardiogram Echocardiogram Transesophageal has been performed.  Darlina Sicilian M 09/08/2015, 9:03 AM

## 2015-09-08 NOTE — Discharge Instructions (Signed)
Conscious Sedation, Adult, Care After °Refer to this sheet in the next few weeks. These instructions provide you with information on caring for yourself after your procedure. Your health care provider may also give you more specific instructions. Your treatment has been planned according to current medical practices, but problems sometimes occur. Call your health care provider if you have any problems or questions after your procedure. °WHAT TO EXPECT AFTER THE PROCEDURE  °After your procedure: °· You may feel sleepy, clumsy, and have poor balance for several hours. °· Vomiting may occur if you eat too soon after the procedure. °HOME CARE INSTRUCTIONS °· Do not participate in any activities where you could become injured for at least 24 hours. Do not: °¨ Drive. °¨ Swim. °¨ Ride a bicycle. °¨ Operate heavy machinery. °¨ Cook. °¨ Use power tools. °¨ Climb ladders. °¨ Work from a high place. °· Do not make important decisions or sign legal documents until you are improved. °· If you vomit, drink water, juice, or soup when you can drink without vomiting. Make sure you have little or no nausea before eating solid foods. °· Only take over-the-counter or prescription medicines for pain, discomfort, or fever as directed by your health care provider. °· Make sure you and your family fully understand everything about the medicines given to you, including what side effects may occur. °· You should not drink alcohol, take sleeping pills, or take medicines that cause drowsiness for at least 24 hours. °· If you smoke, do not smoke without supervision. °· If you are feeling better, you may resume normal activities 24 hours after you were sedated. °· Keep all appointments with your health care provider. °SEEK MEDICAL CARE IF: °· Your skin is pale or bluish in color. °· You continue to feel nauseous or vomit. °· Your pain is getting worse and is not helped by medicine. °· You have bleeding or swelling. °· You are still sleepy or  feeling clumsy after 24 hours. °SEEK IMMEDIATE MEDICAL CARE IF: °· You develop a rash. °· You have difficulty breathing. °· You develop any type of allergic problem. °· You have a fever. °MAKE SURE YOU: °· Understand these instructions. °· Will watch your condition. °· Will get help right away if you are not doing well or get worse. °Document Released: 10/02/2013 Document Reviewed: 10/02/2013 °[employer]® Patient Information ©2015 [employer]. This information is not intended to replace advice given to you by your health care provider. Make sure you discuss any questions you have with your health care provider. °  °

## 2015-09-08 NOTE — Interval H&P Note (Signed)
History and Physical Interval Note:  09/08/2015 8:19 AM  Samantha Gregory  has presented today for surgery, with the diagnosis of EVALUATE AORTIC VALVES  The various methods of treatment have been discussed with the patient and family. After consideration of risks, benefits and other options for treatment, the patient has consented to  Procedure(s): TRANSESOPHAGEAL ECHOCARDIOGRAM (TEE) (N/A) as a surgical intervention .  The patient's history has been reviewed, patient examined, no change in status, stable for surgery.  I have reviewed the patient's chart and labs.  Questions were answered to the patient's satisfaction.     Kirk Ruths

## 2015-09-08 NOTE — CV Procedure (Signed)
See full TEE report in [employer]; normal LV function; moderate (3+) AI. Samantha Gregory

## 2015-09-08 NOTE — H&P (Signed)
Samantha Gregory  09/02/2015 8:00 AM  Office Visit  MRN:  [id number]   Description: Female DOB: 10-Aug-1935  Provider: Darlin Coco, MD  Department: [facility] [facility]       Vital Signs  Most recent update: 09/02/2015 7:50 AM by Tod Persia, CMA    BP Pulse Ht Wt BMI SpO2    132/74 mmHg 77 5\' 4"  (1.626 m) 41.912 kg (92 lb 6.4 oz) 15.85 kg/m2 99%      Progress Notes      Darlin Coco, MD at 09/02/2015 7:44 AM     Status: Signed       Expand All Collapse All      Cardiology Office Note   Date: 09/02/2015   ID: Cesilia Shinn, DOB 06/14/1935, MRN [id number]  PCP: Warren Danes, MD Cardiologist: Darlin Coco MD  No chief complaint on file.    History of Present Illness: Samantha Gregory is a 80 y.o. female who presents for follow-up office visit. The patient has a previous history of orthostatic hypotension. This has improved with liberalization of salt and fluid intake. She was recently seen complaining of shortness of breath. She had an echocardiogram on 08/28/15 which showed moderate aortic insufficiency.   In 2001 she had a questionable TIA and had an echocardiogram with bubble study which did not show any embolic source. She has had previous carotid Dopplers which are normal. The patient is no longer on statins. She has tried most of the statins. She had to go off the statins because it was causing her to have decreased memory. Fortunately her HDL level is satisfactory and her risk ratio is not excessive.  She has a history of occasional PVCs. She drinks coffee in the morning but not the rest of the day. She has been under a lot of stress. 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 She is running herself ragged. She feels exhausted a great deal of the time. The elderly gentleman is now a resident of [facility]. However, Mrs. Rentfrow continues to go there are 3 times a day and feeds him  and bathes him etc. The patient has had increased exertional dyspnea. She is unable to lie on her left side at night because she cannot breathe as well. She sleeps on one pillow. She has had some tachycardia. Her last chest x-ray was in 2011. The patient underwent a transthoracic echocardiogram on 08/28/15 which demonstrated; Normal LV size with low normal to mildly reduced systolic function, EF 38-25%. Normal RV size and systolic function. Trileaflet aortic valve with significant aortic regurgitation. In the apical three chamber view, the aortic regurgitation appeared moderate to severe. However, a short pressure halftime of the AR signal was never obtained. There was diastolic flow reversal in the descending thoracic aorta doppler signal but it did not appear holodiastolic. Would consider TEE if patient is symptomatic.  The patient sleeps on one pillow. She denies excessive dyspnea. She does complain of lack of energy. She has not been having any chest pain. Blood pressure is elevated today. Past Medical History  Diagnosis Date  . Hypercholesterolemia   . Heart palpitations   . Mitral regurgitation   . Paroxysmal atrial tachycardia     Past Surgical History  Procedure Laterality Date  . Appendectomy       Current Outpatient Prescriptions  Medication Sig Dispense Refill  . aspirin 81 MG tablet Take 1 tablet (81 mg total) by mouth every other day.    Marland Kitchen  cholecalciferol (VITAMIN D) 1000 UNITS tablet Take 1,000 Units by mouth daily.    Marland Kitchen losartan (COZAAR) 25 MG tablet Take 1 tablet (25 mg total) by mouth daily. 30 tablet 5  . metoprolol tartrate (LOPRESSOR) 25 MG tablet Take 1 tablet (25 mg total) by mouth 2 (two) times daily as needed (Use as needed for rapid heart beat.). 60 tablet 3  . Multiple Vitamins-Iron (MULTIVITAMIN/IRON PO) Take 1 tablet by mouth daily.      No current facility-administered  medications for this visit.    Allergies: Statins    Social History: The patient  reports that she has never smoked. She does not have any smokeless tobacco history on file. She reports that she drinks alcohol.   Family History: The patient's family history includes Heart attack in her father; Heart disease in her brother, sister, and sister; Hypertension in her brother; Kidney disease in her mother; Stroke in her mother, paternal aunt, and paternal uncle.    ROS: Please see the history of present illness. Otherwise, review of systems are positive for none. All other systems are reviewed and negative.    PHYSICAL EXAM: VS: BP 132/74 mmHg  Pulse 77  Ht 5\' 4"  (1.626 m)  Wt 92 lb 6.4 oz (41.912 kg)  BMI 15.85 kg/m2  SpO2 99% , BMI Body mass index is 15.85 kg/(m^2). GEN: Well nourished, well developed, in no acute distress  HEENT: normal  Neck: no JVD, carotid bruits, or masses Cardiac: RRR; there is a faint murmur of aortic insufficiency at the left sternal edge. There is a prominent left ventricular lift at the anterior axillary line. Respiratory: clear to auscultation bilaterally, normal work of breathing GI: soft, nontender, nondistended, + BS MS: no deformity or atrophy  Skin: warm and dry, no rash Neuro: Strength and sensation are intact Psych: euthymic mood, full affect   EKG: EKG is not ordered today.    Recent Labs: 08/21/2015: ALT 18; BUN 20; Creatinine, Ser 0.76; Hemoglobin 12.6; Platelets 196.0; Potassium 3.8; Sodium 138    Lipid Panel  Labs (Brief)       Component Value Date/Time   CHOL 218* 08/21/2015 0932   TRIG 91.0 08/21/2015 0932   HDL 82.60 08/21/2015 0932   CHOLHDL 3 08/21/2015 0932   VLDL 18.2 08/21/2015 0932   LDLCALC 117* 08/21/2015 0932   LDLDIRECT 117.0 02/05/2014 0812       Wt Readings from Last 3 Encounters:  09/02/15 92 lb 6.4 oz (41.912 kg)  08/21/15 92 lb 12.8 oz (42.094 kg)    04/29/15 92 lb 12.8 oz (42.094 kg)        ASSESSMENT AND PLAN:  1. orthostatic hypotension and dizziness, resolved by increasing salt and water intake. She is now hypertensive. 2. hypercholesterolemia treated with careful diet 3. osteoporosis and a vitamin D deficiency 4. tachycardia palpitations controlled with beta blocker 5. remote history of TIA without recurrent symptoms  6. Situational stress and exhaustion 7. History of anemia. Recent iron/anemia profile was normal on 04/23/15 8. Moderate to severe aortic insufficiency.   Current medicines are reviewed at length with the patient today. The patient does not have concerns regarding medicines.  The following changes have been made: Begin losartan 25 mg one daily  Labs/ tests ordered today include:  Orders Placed This Encounter  Procedures  . Basic metabolic panel     Disposition: We will proceed with the TEE to evaluate the severity of her aortic valve. Visually the echo suggested moderate to severe aortic insufficiency,  although the pressure half-time which was obtained was not short. Clinically she is not having any symptoms of CHF at this point. We are going to start her on losartan for afterload reduction of her systolic hypertension. She needs to cut back on her involvement with her friend in the nursing home. She should avoid heavy lifting and straining which she currently is doing. We will check a basal metabolic panel in 2 weeks. Return in 2 months for office visit  Signed, Darlin Coco MD 09/02/2015 8:36 AM  [address], [place], [place] [address] Phone: [phone]; Fax: [phone]        For TEE; no changes Kirk Ruths

## 2015-09-09 ENCOUNTER — Encounter (HOSPITAL_COMMUNITY): Payer: Self-pay | Admitting: Cardiology

## 2015-09-16 ENCOUNTER — Other Ambulatory Visit (INDEPENDENT_AMBULATORY_CARE_PROVIDER_SITE_OTHER): Payer: PPO | Admitting: *Deleted

## 2015-09-16 DIAGNOSIS — I10 Essential (primary) hypertension: Secondary | ICD-10-CM | POA: Diagnosis not present

## 2015-09-16 LAB — BASIC METABOLIC PANEL
BUN: 25 mg/dL — AB (ref 6–23)
CALCIUM: 9.5 mg/dL (ref 8.4–10.5)
CO2: 34 mEq/L — ABNORMAL HIGH (ref 19–32)
CREATININE: 0.8 mg/dL (ref 0.40–1.20)
Chloride: 104 mEq/L (ref 96–112)
GFR: 73.78 mL/min (ref >60.00)
GLUCOSE: 86 mg/dL (ref 70–99)
Potassium: 4.8 mEq/L (ref 3.5–5.1)
SODIUM: 141 meq/L (ref 135–145)

## 2015-09-22 NOTE — Progress Notes (Signed)
Quick Note:  Please report to patient. The recent labs are stable. Continue same medication and careful diet. ______ 

## 2015-09-30 ENCOUNTER — Telehealth: Payer: Self-pay | Admitting: Cardiology

## 2015-09-30 NOTE — Telephone Encounter (Signed)
Left message to call back  

## 2015-09-30 NOTE — Telephone Encounter (Signed)
New Message       Pt calling wanting Dr. Mare Ferrari to call in a prescription for her, pt states Dr. Mare Ferrari already knows the problem. Please call back and advise.

## 2015-10-01 NOTE — Telephone Encounter (Signed)
New Message  Pt c/o swelling: STAT is pt has developed SOB within 24 hours  1. How long have you been experiencing swelling? For about 1 week now. The pt states that she went to the dermatologist as she was referred to do and she reports that the dermatologist says that it is due to circulation. There is a place that she hurt and it is swelling around her ankle and it is painful to stand on.   2. Where is the swelling located? legs  3.  Are you currently taking a "fluid pill"? No. But she takes medication for High Blood pressure its called generic for Cozaar 1 tablet 25 mg daily. (Losartan potassium)   4.  Are you currently SOB? Yes but its due to other heart problems per pt   5.  Have you traveled recently? No   Comments: Requests an antibiotic called in for the swelling and pain. Please call back to discuss

## 2015-10-01 NOTE — Telephone Encounter (Signed)
Spoke with patient and she hit her lower leg a few weeks ago She has been keeping the place clean Had appointment with dermatologist 09/22/15 and per patient he though it was a circulation issue Place has become painful to stand on, swelling, and red around it (denies streaks). Worse since seeing dermatologist Patient concerned she needs an antibiotic No PCP, will forward to  Dr. Mare Ferrari for review

## 2015-10-01 NOTE — Telephone Encounter (Signed)
Discussed with  Dr. Mare Ferrari who is not seeing patients in office today and he recommended Urgent Care for evaluation Advised patient, verbalized understanding

## 2015-10-08 ENCOUNTER — Ambulatory Visit (INDEPENDENT_AMBULATORY_CARE_PROVIDER_SITE_OTHER): Payer: PPO | Admitting: Cardiology

## 2015-10-08 ENCOUNTER — Encounter: Payer: Self-pay | Admitting: Cardiology

## 2015-10-08 VITALS — BP 136/76 | HR 68 | Ht 65.0 in | Wt 94.1 lb

## 2015-10-08 DIAGNOSIS — I10 Essential (primary) hypertension: Secondary | ICD-10-CM

## 2015-10-08 DIAGNOSIS — R0609 Other forms of dyspnea: Secondary | ICD-10-CM | POA: Diagnosis not present

## 2015-10-08 DIAGNOSIS — R0600 Dyspnea, unspecified: Secondary | ICD-10-CM

## 2015-10-08 DIAGNOSIS — I351 Nonrheumatic aortic (valve) insufficiency: Secondary | ICD-10-CM

## 2015-10-08 DIAGNOSIS — R42 Dizziness and giddiness: Secondary | ICD-10-CM

## 2015-10-08 NOTE — Progress Notes (Signed)
Cardiology Office Note   Date:  10/08/2015   ID:  Samantha Gregory, [place] 1934-12-28, MRN [id number]  PCP:  Warren Danes, MD  Cardiologist: Darlin Coco MD  No chief complaint on file.     History of Present Illness: Samantha Gregory is a 80 y.o. female who presents for scheduled follow-up office visit.  Samantha Gregory is a 80 y.o. female who presents for follow-up office visit. The patient has a previous history of orthostatic hypotension. This has improved with liberalization of salt and fluid intake. She was recently seen complaining of shortness of breath. She had an echocardiogram on 08/28/15 which showed moderate aortic insufficiency.   In 2001 she had a questionable TIA and had an echocardiogram with bubble study which did not show any embolic source. She has had previous carotid Dopplers which are normal. The patient is no longer on statins. She has tried most of the statins. She had to go off the statins because it was causing her to have decreased memory. Fortunately her HDL level is satisfactory and her risk ratio is not excessive.  She has a history of occasional PVCs. She drinks coffee in the morning but not the rest of the day. She has been under a lot of stress. 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 She is running herself ragged. She feels exhausted a great deal of the time. The elderly gentleman is now a resident of [facility]. However, Samantha Gregory continues to go there are 3 times a day and feeds him and bathes him etc. The patient has had increased exertional dyspnea. She is unable to lie on her left side at night because she cannot breathe as well. She sleeps on one pillow. She has had some tachycardia. Her last chest x-ray was in 2011. The patient underwent a transthoracic echocardiogram on 08/28/15 which demonstrated; Normal LV size with low normal to mildly reduced systolic function, EF  41-32%. Normal RV size and systolic function. Trileaflet aortic valve with significant aortic regurgitation. In the apical three chamber view, the aortic regurgitation appeared moderate to severe. However, a short pressure halftime of the AR signal was never obtained. There was diastolic flow reversal in the descending thoracic aorta doppler signal but it did not appear holodiastolic. Would consider TEE if patient is symptomatic.  The patient sleeps on one pillow. She denies excessive dyspnea. She does complain of lack of energy. She has not been having any chest pain. Since last visit she has continued to sleep on one pillow and is not experiencing any paroxysmal nocturnal dyspnea.  She does have exertional dyspnea.  Her blood pressure has been stable on losartan and metoprolol.  Not having any significant peripheral edema in the right leg.  There is dependent edema in the left leg but she has an area of healing ulcer  with surrounding erythema and low-grade cellulitis.  Past Medical History  Diagnosis Date  . Hypercholesterolemia   . Heart palpitations   . Mitral regurgitation   . Paroxysmal atrial tachycardia [facility])     Past Surgical History  Procedure Laterality Date  . Appendectomy    . Tee without cardioversion N/A 09/08/2015    Procedure: TRANSESOPHAGEAL ECHOCARDIOGRAM (TEE);  Surgeon: Lelon Perla, MD;  Location: [facility] ENDOSCOPY;  Service: Cardiovascular;  Laterality: N/A;     Current Outpatient Prescriptions  Medication Sig Dispense Refill  . aspirin 81 MG tablet Take 1 tablet (81 mg total) by mouth  every other day.    . cholecalciferol (VITAMIN D) 1000 UNITS tablet Take 1,000 Units by mouth daily.    Marland Kitchen losartan (COZAAR) 25 MG tablet Take 1 tablet (25 mg total) by mouth daily. 30 tablet 5  . metoprolol tartrate (LOPRESSOR) 25 MG tablet Take 1 tablet (25 mg total) by mouth 2 (two) times daily as needed (Use as needed for rapid heart beat.). 60 tablet 3  .  Multiple Vitamins-Iron (MULTIVITAMIN/IRON PO) Take 1 tablet by mouth daily.      No current facility-administered medications for this visit.    Allergies:   Statins    Social History:  The patient  reports that she has never smoked. She does not have any smokeless tobacco history on file. She reports that she drinks alcohol.   Family History:  The patient's family history includes Heart attack in her father; Heart disease in her brother, sister, and sister; Hypertension in her brother; Kidney disease in her mother; Stroke in her mother, paternal aunt, and paternal uncle.    ROS:  Please see the history of present illness.   Otherwise, review of systems are positive for none.   All other systems are reviewed and negative.    PHYSICAL EXAM: VS:  BP 136/76 mmHg  Pulse 68  Ht 5\' 5"  (1.651 m)  Wt 94 lb 1.9 oz (42.693 kg)  BMI 15.66 kg/m2 , BMI Body mass index is 15.66 kg/(m^2). GEN: Well nourished, well developed, in no acute distress HEENT: normal Neck: no JVD, carotid bruits, or masses Cardiac: RRR; there is a grade 2/6 harsh systolic ejection murmur at the base and a soft murmur of aortic insufficiency at the left sternal edge.  The left ventricular apical impulse is accentuated.  Rhythm is regular. Respiratory:  clear to auscultation bilaterally, normal work of breathing GI: soft, nontender, nondistended, + BS MS: no deformity or atrophy Skin: warm and dry, no rash.  There is an ulcer on the anterior aspect of the left lower leg about the size of a nickel which is healing slowly by secondary intention. Neuro:  Strength and sensation are intact Psych: euthymic mood, full affect   EKG:  EKG is not ordered today.    Recent Labs: 08/21/2015: ALT 18; Hemoglobin 12.6; Platelets 196.0 09/16/2015: BUN 25*; Creatinine, Ser 0.80; Potassium 4.8; Sodium 141    Lipid Panel    Component Value Date/Time   CHOL 218* 08/21/2015 0932   TRIG 91.0 08/21/2015 0932   HDL 82.60 08/21/2015 0932    CHOLHDL 3 08/21/2015 0932   VLDL 18.2 08/21/2015 0932   LDLCALC 117* 08/21/2015 0932   LDLDIRECT 117.0 02/05/2014 0812      Wt Readings from Last 3 Encounters:  10/08/15 94 lb 1.9 oz (42.693 kg)  09/02/15 92 lb 6.4 oz (41.912 kg)  08/21/15 92 lb 12.8 oz (42.094 kg)        ASSESSMENT AND PLAN:  1. orthostatic hypotension and dizziness, resolved by increasing salt and water intake. She is now normotensive on losartan and beta blocker 2. hypercholesterolemia treated with careful diet 3. osteoporosis and a vitamin D deficiency 4. tachycardia palpitations controlled with beta blocker 5. remote history of TIA without recurrent symptoms  6. Situational stress and exhaustion 7. History of anemia. Recent iron/anemia profile was normal on 04/23/15 8. Moderate to severe aortic insufficiency.   Current medicines are reviewed at length with the patient today.  The patient does not have concerns regarding medicines.  The following changes have been made:  no change  Labs/ tests ordered today include:  No orders of the defined types were placed in this encounter.    Disposition: Continue current medication.  We talked a long time about her predicament regarding her friend in the nursing home.  I advised her to talk directly with the people at the [facility] to see if he doesn't qualify for custodial care at the [facility] here in 3 months for follow-up office visit.  I told her that I would be retiring at the end of February.  She will need to get a PCP.  Discuss further next visit.   Berna Spare MD 10/08/2015 2:29 PM    [facility] [address], [place], [place]  [address] Phone: [phone]; Fax: [phone]

## 2015-10-08 NOTE — Patient Instructions (Signed)
Medication Instructions:  Your physician recommends that you continue on your current medications as directed. Please refer to the Current Medication list given to you today.  Labwork: NONE  Testing/Procedures: NONE  Follow-Up: IN January

## 2015-11-13 ENCOUNTER — Telehealth: Payer: Self-pay

## 2015-11-13 NOTE — Telephone Encounter (Signed)
Left message to call back  

## 2015-11-13 NOTE — Telephone Encounter (Signed)
Patient calls this AM with sore throat, head congestion and sl dry cough. Doesn't think she has a fever. Symptoms came on suddenly. Using Coricidin D only, but thinks she needs something else. NKDA. Uses [facility] in [place].

## 2015-11-15 NOTE — Telephone Encounter (Signed)
Okay to call in a Z-Pak

## 2015-11-16 MED ORDER — AZITHROMYCIN 250 MG PO TABS
ORAL_TABLET | ORAL | Status: DC
Start: 2015-11-16 — End: 2016-02-24

## 2015-11-16 NOTE — Telephone Encounter (Signed)
Advised patient and she will continue the Mucinex Did strongly encourage finding a PCP to handle these needs soon, verbalized understanding

## 2015-11-23 ENCOUNTER — Telehealth: Payer: Self-pay | Admitting: Cardiology

## 2015-11-23 NOTE — Telephone Encounter (Signed)
New Message    Pt is wanting Dr.Brackbill to write her another perscribtion Azithromycin 250mg  she   said the last one he sent in dont feel like it cured it all

## 2015-11-23 NOTE — Telephone Encounter (Signed)
Spoke with patient and advised Zpak is taken for 5 days but stays in system up to 10.  Patient denies any fever but still has a lot of head congestion Advise to continue Mucinex but if she feels she needs to be evaluated or develops a fever to go to Urgent Care.  Patient verbalized understanding

## 2015-12-24 ENCOUNTER — Telehealth: Payer: Self-pay

## 2015-12-24 NOTE — Telephone Encounter (Signed)
Patient called in stating she thinks her losartan had been causing a rash on her. She stated she stopped the losartan two days ago and rash is gone. She wanted to know if she should be put on a new hypertension medication. I forwarded her to triage main number to be helped and explained if no one answers to call back and ask for nurse. She stated her understanding.

## 2015-12-24 NOTE — Telephone Encounter (Signed)
Patient called in stating she thinks her losartan had been causing a rash on her. She stated she stopped the losartan two days ago and rash is gone. She wanted to know if she should be put on a new hypertension medication. I forwarded her to triage main number.

## 2015-12-29 ENCOUNTER — Telehealth: Payer: Self-pay | Admitting: Cardiology

## 2015-12-29 NOTE — Telephone Encounter (Signed)
New Message  Pt wanted lab work done tomorrow 1/4 for appt w/ Dr Mare Ferrari on 1/6- no order in systm. Please advise/

## 2015-12-29 NOTE — Telephone Encounter (Signed)
Okay to check LP, BMET and HFP ahead of Friday OV

## 2015-12-29 NOTE — Telephone Encounter (Signed)
Informed patient that Dr. Mare Ferrari and Rip Harbour were not in the office today, but will return tomorrow. She would like to have her cholesterol checked before seeing Dr. Mare Ferrari on Friday. Advised patient that we would review with Brackbill/Melinda on specific labs to be drawn and call her tomorrow. She is agreeable to this.

## 2015-12-30 ENCOUNTER — Other Ambulatory Visit (INDEPENDENT_AMBULATORY_CARE_PROVIDER_SITE_OTHER): Payer: PPO | Admitting: *Deleted

## 2015-12-30 DIAGNOSIS — I10 Essential (primary) hypertension: Secondary | ICD-10-CM

## 2015-12-30 DIAGNOSIS — E7800 Pure hypercholesterolemia, unspecified: Secondary | ICD-10-CM

## 2015-12-30 LAB — HEPATIC FUNCTION PANEL
ALT: 17 U/L (ref 6–29)
AST: 25 U/L (ref 10–35)
Albumin: 4.1 g/dL (ref 3.6–5.1)
Alkaline Phosphatase: 76 U/L (ref 33–130)
BILIRUBIN DIRECT: 0.1 mg/dL (ref <=0.2)
BILIRUBIN INDIRECT: 0.6 mg/dL (ref 0.2–1.2)
Total Bilirubin: 0.7 mg/dL (ref 0.2–1.2)
Total Protein: 6.7 g/dL (ref 6.1–8.1)

## 2015-12-30 LAB — BASIC METABOLIC PANEL
BUN: 20 mg/dL (ref 7–25)
CALCIUM: 9.3 mg/dL (ref 8.6–10.4)
CO2: 28 mmol/L (ref 20–31)
CREATININE: 0.8 mg/dL (ref 0.60–0.93)
Chloride: 102 mmol/L (ref 98–110)
Glucose, Bld: 79 mg/dL (ref 65–99)
Potassium: 3.8 mmol/L (ref 3.5–5.3)
SODIUM: 139 mmol/L (ref 135–146)

## 2015-12-30 LAB — LIPID PANEL
CHOL/HDL RATIO: 2.3 ratio (ref <=5.0)
CHOLESTEROL: 197 mg/dL (ref 125–200)
HDL: 87 mg/dL (ref >=46)
LDL Cholesterol: 93 mg/dL (ref <130)
Triglycerides: 85 mg/dL (ref <150)
VLDL: 17 mg/dL (ref <30)

## 2015-12-30 NOTE — Addendum Note (Signed)
Addended by: Eulis Foster on: 12/30/2015 07:35 AM   Modules accepted: Orders

## 2015-12-30 NOTE — Progress Notes (Signed)
Quick Note:  Please make copy of labs for patient visit. ______ 

## 2015-12-30 NOTE — Telephone Encounter (Signed)
Labs done today.

## 2015-12-30 NOTE — Addendum Note (Signed)
Addended by: Eulis Foster on: 12/30/2015 07:41 AM   Modules accepted: Orders

## 2016-01-01 ENCOUNTER — Ambulatory Visit (INDEPENDENT_AMBULATORY_CARE_PROVIDER_SITE_OTHER): Payer: PPO | Admitting: Cardiology

## 2016-01-01 ENCOUNTER — Encounter: Payer: Self-pay | Admitting: Cardiology

## 2016-01-01 VITALS — BP 130/90 | HR 72 | Ht 65.0 in | Wt 95.8 lb

## 2016-01-01 DIAGNOSIS — I10 Essential (primary) hypertension: Secondary | ICD-10-CM | POA: Diagnosis not present

## 2016-01-01 DIAGNOSIS — I351 Nonrheumatic aortic (valve) insufficiency: Secondary | ICD-10-CM

## 2016-01-01 DIAGNOSIS — R002 Palpitations: Secondary | ICD-10-CM

## 2016-01-01 NOTE — Progress Notes (Signed)
Cardiology Office Note   Date:  01/01/2016   ID:  [name], [name] 11-Nov-1935, MRN [id number]  PCP:  Warren Danes, MD  Cardiologist: Darlin Coco MD  Chief Complaint  Patient presents with  . routine follow up    essential hypertension  denies chest pain/shortness of breath c/o swelling in feet left is worse than right      History of Present Illness: Samantha Gregory is a 81 y.o. female who presents for scheduled follow-up visit . The patient has a previous history of orthostatic hypotension. This has improved with liberalization of salt and fluid intake. She was recently seen complaining of shortness of breath. She had an echocardiogram on 08/28/15 which showed moderate aortic insufficiency.   In 2001 she had a questionable TIA and had an echocardiogram with bubble study which did not show any embolic source. She has had previous carotid Dopplers which are normal. The patient is no longer on statins. She has tried most of the statins. She had to go off the statins because it was causing her to have decreased memory. Fortunately her HDL level is satisfactory and her risk ratio is not excessive.  She has a history of occasional PVCs. She drinks coffee in the morning but not the rest of the day. She has been under a lot of stress. 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 She has tried [facility] the [facility] with [facility].  Samantha Gregory is still going over to check on him and help with nursing care 2 times a day and is wearing herself out The patient has had increased exertional dyspnea. She is unable to lie on her left side at night because she cannot breathe as well. She sleeps on one pillow. She has had some tachycardia.  The patient underwent a transthoracic echocardiogram on 08/28/15 which demonstrated; Normal LV size with low normal to mildly reduced systolic function, EF 99991111. Normal RV size and  systolic function. Trileaflet aortic valve with significant aortic regurgitation. In the apical three chamber view, the aortic regurgitation appeared moderate to severe. However, a short pressure halftime of the AR signal was never obtained. There was diastolic flow reversal in the descending thoracic aorta doppler signal but it did not appear holodiastolic. Would consider TEE if patient is symptomatic.  The patient sleeps on one pillow. She denies excessive dyspnea. She does complain of lack of energy. She has not been having any chest pain. Since last visit she has continued to sleep on one pillow and is not experiencing any paroxysmal nocturnal dyspnea. She does have exertional dyspnea. Her blood pressure has been stable on losartan and metoprolol. Not having any significant peripheral edema in the right leg. There is dependent edema in the left leg but she has an area of healing ulcer with surrounding erythema and low-grade cellulitis.  She has evidence of stasis dermatitis.  She was concerned that the losartan may have been contributing to the rash but it is a localized rash which appears to be related to some chronic venous insufficiency of the lower extremities worse on the left.  She stopped her losartan about a week ago concerned that she was having an allergic reaction.  She saw her dermatologist who gave her some triamcinolone cream which has helped.  She does need losartan because of her hypertension and her aortic insufficiency.  We will have her resume it at a lower dose of 12.5  mg daily.    Past Medical History  Diagnosis Date  . Hypercholesterolemia   . Heart palpitations   . Mitral regurgitation   . Paroxysmal atrial tachycardia [facility])     Past Surgical History  Procedure Laterality Date  . Appendectomy    . Tee without cardioversion N/A 09/08/2015    Procedure: TRANSESOPHAGEAL ECHOCARDIOGRAM (TEE);  Surgeon: Lelon Perla, MD;  Location: [facility]  ENDOSCOPY;  Service: Cardiovascular;  Laterality: N/A;     Current Outpatient Prescriptions  Medication Sig Dispense Refill  . aspirin 81 MG tablet Take 1 tablet (81 mg total) by mouth every other day.    Marland Kitchen azithromycin (ZITHROMAX) 250 MG tablet 2 tablets by mouth on day 1 and then 1 daily 6 each 0  . cholecalciferol (VITAMIN D) 1000 UNITS tablet Take 1,000 Units by mouth daily.    Marland Kitchen losartan (COZAAR) 25 MG tablet Take 25 mg by mouth as directed. TAKE 1/2 TABLET BY MOUTH DAILY    . metoprolol tartrate (LOPRESSOR) 25 MG tablet Take 1 tablet (25 mg total) by mouth 2 (two) times daily as needed (Use as needed for rapid heart beat.). 60 tablet 3  . Multiple Vitamins-Iron (MULTIVITAMIN/IRON PO) Take 1 tablet by mouth daily.      No current facility-administered medications for this visit.    Allergies:   Statins    Social History:  The patient  reports that she has never smoked. She does not have any smokeless tobacco history on file. She reports that she drinks alcohol.   Family History:  The patient's family history includes Heart attack in her father; Heart disease in her brother, sister, and sister; Hypertension in her brother; Kidney disease in her mother; Stroke in her mother, paternal aunt, and paternal uncle.    ROS:  Please see the history of present illness.   Otherwise, review of systems are positive for none.   All other systems are reviewed and negative.    PHYSICAL EXAM: VS:  BP 130/90 mmHg  Pulse 72  Ht 5\' 5"  (1.651 m)  Wt 95 lb 12.8 oz (43.455 kg)  BMI 15.94 kg/m2 , BMI Body mass index is 15.94 kg/(m^2). GEN: Well nourished, well developed, in no acute distress HEENT: normal Neck: no JVD, carotid bruits, or masses Cardiac: RRR; there is a grade 1/6 decrescendo diastolic murmur at the left sternal edge.  There is trace pretibial edema and evidence of stasis dermatitis and venous insufficiency.  Her pedal pulses are strong. Respiratory:  clear to auscultation  bilaterally, normal work of breathing GI: soft, nontender, nondistended, + BS MS: no deformity or atrophy Skin: warm and dry, no rash Neuro:  Strength and sensation are intact Psych: euthymic mood, full affect   EKG:  EKG is not ordered today.   Recent Labs: 08/21/2015: Hemoglobin 12.6; Platelets 196.0 12/30/2015: ALT 17; BUN 20; Creat 0.80; Potassium 3.8; Sodium 139    Lipid Panel    Component Value Date/Time   CHOL 197 12/30/2015 0741   TRIG 85 12/30/2015 0741   HDL 87 12/30/2015 0741   CHOLHDL 2.3 12/30/2015 0741   VLDL 17 12/30/2015 0741   LDLCALC 93 12/30/2015 0741   LDLDIRECT 117.0 02/05/2014 0812      Wt Readings from Last 3 Encounters:  01/01/16 95 lb 12.8 oz (43.455 kg)  10/08/15 94 lb 1.9 oz (42.693 kg)  09/02/15 92 lb 6.4 oz (41.912 kg)         ASSESSMENT AND PLAN:  1. orthostatic hypotension and dizziness,  resolved by increasing salt and water intake. She is now normotensive on losartan and beta blocker.  Her diastolic blood pressure is borderline high today but she has not taken her losartan in the past week.  We will have her restart her losartan at a lower dose of 12.5 mg daily 2. hypercholesterolemia treated with careful diet. her lipids are better on recent labs.   3. osteoporosis and a vitamin D deficiency 4. tachycardia palpitations controlled with beta blocker.  Continue current nmetoprolol 5. remote history of TIA without recurrent symptoms  6. Situational stress and exhaustion 7. History of anemia. Recent iron/anemia profile was normal on 04/23/15 8. Moderate to severe aortic insufficiency.  By echocardiogram.  Symptoms however are minimal.  Current medicines are reviewed at length with the patient today.  The patient has concerns regarding medicines.  The following changes have been made:  Restart losartan in a lower dose of 12.5 mg daily  Labs/ tests ordered today include:  No orders of the defined types were placed in this encounter.     Disposition: Return in 3 months for follow-up office visit with Dr. Illene Bolus.  She also needs to get a primary care physician.  We suggested that she check with [facility] or continue with [facility].  Berna Spare MD 01/01/2016 11:45 AM    [facility] [address], [place], [place]  [address] Phone: [phone]; Fax: [phone]

## 2016-01-01 NOTE — Patient Instructions (Addendum)
Medication Instructions:  DECREASE YOUR LOSARTAN TO 12.5 MG DAILY (TAKE 1/2 OF THE 25 MG TABLET)  Labwork: NONE  Testing/Procedures: NONE  Follow-Up: Your physician recommends that you schedule a follow-up appointment in: 3 MONTH OV WITH DR Irish Lack   Any Other Special Instructions Will Be Listed Below (If Applicable). OK TO GET FLU VACCINE AT PHARMACY   LIST OF PCP'S ATTACHED FOR YOU TO CALL AND GET ESTABLISHED   If you need a refill on your cardiac medications before your next appointment, please call your pharmacy.

## 2016-01-14 DIAGNOSIS — I8312 Varicose veins of left lower extremity with inflammation: Secondary | ICD-10-CM | POA: Diagnosis not present

## 2016-01-14 DIAGNOSIS — I8311 Varicose veins of right lower extremity with inflammation: Secondary | ICD-10-CM | POA: Diagnosis not present

## 2016-01-14 DIAGNOSIS — I872 Venous insufficiency (chronic) (peripheral): Secondary | ICD-10-CM | POA: Diagnosis not present

## 2016-01-14 DIAGNOSIS — L2089 Other atopic dermatitis: Secondary | ICD-10-CM | POA: Diagnosis not present

## 2016-02-12 ENCOUNTER — Telehealth: Payer: Self-pay | Admitting: Cardiology

## 2016-02-12 NOTE — Telephone Encounter (Signed)
New Message  Former [facility] re-establishing w/ Dr Clayton Bibles on 4/26 wants to do lab work 4/19- no order in syst. Please advise.

## 2016-02-12 NOTE — Telephone Encounter (Signed)
Left message to call back  

## 2016-02-15 NOTE — Telephone Encounter (Signed)
Returning your call. °

## 2016-02-15 NOTE — Telephone Encounter (Signed)
Spoke with patient and advised labs could be done at scheduled, would let Dr Irish Lack decide

## 2016-02-24 ENCOUNTER — Encounter: Payer: PPO | Admitting: Internal Medicine

## 2016-02-24 NOTE — Assessment & Plan Note (Signed)
Has not tolerated statins Controlling cholesterol with lifestyle

## 2016-02-24 NOTE — Assessment & Plan Note (Signed)
Following with cardiology She has some dyspnea on exertion

## 2016-02-24 NOTE — Progress Notes (Signed)
    Subjective:    Patient ID: Samantha Gregory, female    DOB: 1935/09/05, 81 y.o.   MRN: [id number]  HPI Error - no show  Medications and allergies reviewed with patient and updated if appropriate.  Patient Active Problem List   Diagnosis Date Noted  . Moderate aortic insufficiency 09/02/2015  . Essential hypertension 09/02/2015  . Dyspnea on exertion 08/21/2015  . Low hemoglobin 12/11/2014  . Orthostatic hypotension 03/31/2014  . Dizziness 03/31/2014  . Gastroenteritis, acute 04/01/2013  . Pain of left calf 08/07/2012  . Hypercholesterolemia 06/20/2011  . Malaise and fatigue 06/20/2011  . Osteoporosis 06/20/2011  . Rapid palpitations 06/20/2011    Current Outpatient Prescriptions on File Prior to Visit  Medication Sig Dispense Refill  . aspirin 81 MG tablet Take 1 tablet (81 mg total) by mouth every other day.    . cholecalciferol (VITAMIN D) 1000 UNITS tablet Take 1,000 Units by mouth daily.    Marland Kitchen losartan (COZAAR) 25 MG tablet Take 25 mg by mouth as directed. TAKE 1/2 TABLET BY MOUTH DAILY    . metoprolol tartrate (LOPRESSOR) 25 MG tablet Take 1 tablet (25 mg total) by mouth 2 (two) times daily as needed (Use as needed for rapid heart beat.). 60 tablet 3  . Multiple Vitamins-Iron (MULTIVITAMIN/IRON PO) Take 1 tablet by mouth daily.      No current facility-administered medications on file prior to visit.    Past Medical History  Diagnosis Date  . Hypercholesterolemia   . Heart palpitations   . Mitral regurgitation   . Paroxysmal atrial tachycardia [facility])     Past Surgical History  Procedure Laterality Date  . Appendectomy    . Tee without cardioversion N/A 09/08/2015    Procedure: TRANSESOPHAGEAL ECHOCARDIOGRAM (TEE);  Surgeon: Lelon Perla, MD;  Location: [facility] ENDOSCOPY;  Service: Cardiovascular;  Laterality: N/A;    Social History   Social History  . Marital Status: Divorced    Spouse Name: N/A  . Number of Children: N/A  . Years of Education: N/A     Social History Main Topics  . Smoking status: Never Smoker   . Smokeless tobacco: Not on file  . Alcohol Use: Yes  . Drug Use: Not on file  . Sexual Activity: Not on file   Other Topics Concern  . Not on file   Social History Narrative    Family History  Problem Relation Age of Onset  . Heart attack Father   . Stroke Mother   . Kidney disease Mother   . Stroke Paternal Uncle     several paternal uncles  . Stroke Paternal Aunt   . Heart disease Brother   . Hypertension Brother   . Heart disease Sister   . Heart disease Sister     Review of Systems     Objective:  There were no vitals filed for this visit. There were no vitals filed for this visit. There is no weight on file to calculate BMI.   Physical Exam        Assessment & Plan:    This encounter was created in error - please disregard.

## 2016-03-28 DIAGNOSIS — L820 Inflamed seborrheic keratosis: Secondary | ICD-10-CM | POA: Diagnosis not present

## 2016-03-28 DIAGNOSIS — I8312 Varicose veins of left lower extremity with inflammation: Secondary | ICD-10-CM | POA: Diagnosis not present

## 2016-03-28 DIAGNOSIS — I872 Venous insufficiency (chronic) (peripheral): Secondary | ICD-10-CM | POA: Diagnosis not present

## 2016-03-28 DIAGNOSIS — I8311 Varicose veins of right lower extremity with inflammation: Secondary | ICD-10-CM | POA: Diagnosis not present

## 2016-03-28 DIAGNOSIS — L821 Other seborrheic keratosis: Secondary | ICD-10-CM | POA: Diagnosis not present

## 2016-04-13 ENCOUNTER — Other Ambulatory Visit: Payer: PPO

## 2016-04-20 ENCOUNTER — Encounter: Payer: Self-pay | Admitting: Interventional Cardiology

## 2016-04-20 ENCOUNTER — Ambulatory Visit (INDEPENDENT_AMBULATORY_CARE_PROVIDER_SITE_OTHER): Payer: PPO | Admitting: Interventional Cardiology

## 2016-04-20 VITALS — BP 132/78 | HR 71 | Ht 65.0 in | Wt 94.8 lb

## 2016-04-20 DIAGNOSIS — I10 Essential (primary) hypertension: Secondary | ICD-10-CM

## 2016-04-20 DIAGNOSIS — R21 Rash and other nonspecific skin eruption: Secondary | ICD-10-CM

## 2016-04-20 DIAGNOSIS — I493 Ventricular premature depolarization: Secondary | ICD-10-CM | POA: Diagnosis not present

## 2016-04-20 DIAGNOSIS — I951 Orthostatic hypotension: Secondary | ICD-10-CM

## 2016-04-20 DIAGNOSIS — I351 Nonrheumatic aortic (valve) insufficiency: Secondary | ICD-10-CM | POA: Diagnosis not present

## 2016-04-20 NOTE — Patient Instructions (Signed)
**Note De-Identified Samantha Gregory Obfuscation** Medication Instructions:  Stop taking Losartan-all of your other medications remain the same.  Labwork: None  Testing/Procedures: None  Follow-Up: Your physician recommends that you schedule a follow-up appointment in: 3 weeks with Pharm D in [facility] clinic Your physician wants you to follow-up in: 6 months. You will receive a reminder letter in the mail two months in advance. If you don't receive a letter, please call our office to schedule the follow-up appointment.     If you need a refill on your cardiac medications before your next appointment, please call your pharmacy.

## 2016-04-20 NOTE — Progress Notes (Signed)
Patient ID: Samantha Gregory, female   DOB: 17-Apr-1935, 81 y.o.   MRN: [id number]     Cardiology Office Note   Date:  04/20/2016   ID:  Samantha Gregory, [place] 01/01/1935, MRN [id number]  PCP:  Warren Danes, MD    No chief complaint on file. f/u AI, rash   Wt Readings from Last 3 Encounters:  04/20/16 94 lb 12.8 oz (43.001 kg)  01/01/16 95 lb 12.8 oz (43.455 kg)  10/08/15 94 lb 1.9 oz (42.693 kg)       History of Present Illness: Samantha Gregory is a 81 y.o. female  Who has a history of orthostatic hypotension. This has improved with liberalization of salt and fluid intake. She saw Samantha Gregory in the past.   In 2001 she had a questionable TIA and had an echocardiogram with bubble study which did not show any embolic source. She has had previous carotid Dopplers which are normal. The patient is no longer on statins. She has tried most of the statins. She had to go off the statins because it was causing her to have decreased memory. Fortunately her HDL level is satisfactory and her risk ratio is not excessive.  She has a history of occasional PVCs. She drinks coffee in the morning but not the rest of the day.  She had been under a lot of stress. 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 She has tried to get the patient into one of the veterans hospitals with no success. Samantha Gregory is still going over to check on him and help with nursing care 2 times a day and is wearing herself out.  Two weeks ago, [facility].  She visits him daily for now.  She will do this until he gets settled.  Then she will go less.  Her stress has decreased.  She was recently seen complaining of shortness of breath.  Echo in 9/16 showed:  Normal LV size with low normal to mildly reduced systolic function, EF 99991111. Normal RV size and systolic function. Trileaflet aortic valve with significant aortic regurgitation. In the  apical three chamber view, the aortic regurgitation appeared moderate to severe. However, a short pressure halftime of the AR signal was never obtained. There was diastolic flow reversal in the descending thoracic aorta doppler signal but it did not appear holodiastolic. Would consider TEE if patient is symptomatic.  She has not been walking.      Past Medical History  Diagnosis Date  . Hypercholesterolemia   . Heart palpitations   . Mitral regurgitation   . Paroxysmal atrial tachycardia [facility])     Past Surgical History  Procedure Laterality Date  . Appendectomy    . Tee without cardioversion N/A 09/08/2015    Procedure: TRANSESOPHAGEAL ECHOCARDIOGRAM (TEE);  Surgeon: Lelon Perla, MD;  Location: [facility] ENDOSCOPY;  Service: Cardiovascular;  Laterality: N/A;     Current Outpatient Prescriptions  Medication Sig Dispense Refill  . aspirin 81 MG tablet Take 1 tablet (81 mg total) by mouth every other day.    . cholecalciferol (VITAMIN D) 1000 UNITS tablet Take 1,000 Units by mouth daily.    Marland Kitchen losartan (COZAAR) 25 MG tablet Take 25 mg by mouth as directed. TAKE 1/2 TABLET BY MOUTH DAILY    . metoprolol tartrate (LOPRESSOR) 25 MG tablet Take 1 tablet (25 mg total) by mouth 2 (two) times daily as needed (Use as needed for rapid heart  beat.). 60 tablet 3  . Multiple Vitamins-Iron (MULTIVITAMIN/IRON PO) Take 1 tablet by mouth daily.      No current facility-administered medications for this visit.    Allergies:   Statins    Social History:  The patient  reports that she has never smoked. She does not have any smokeless tobacco history on file. She reports that she drinks alcohol.   Family History:  The patient's family history includes Heart attack in her father; Heart disease in her brother, sister, and sister; Hypertension in her brother; Kidney disease in her mother; Stroke in her mother, paternal aunt, and paternal uncle.    ROS:  Please see the history of present  illness.   Otherwise, review of systems are positive for reduced stress, leg weakness from lack of activity per her report; itchy legs from venous problems per her report; mild DOE.   All other systems are reviewed and negative.    PHYSICAL EXAM: VS:  BP 132/78 mmHg  Pulse 71  Ht 5\' 5"  (1.651 m)  Wt 94 lb 12.8 oz (43.001 kg)  BMI 15.78 kg/m2  SpO2 98% , BMI Body mass index is 15.78 kg/(m^2). GEN: Well nourished, well developed, in no acute distress HEENT: normal Neck: no JVD, carotid bruits, or masses Cardiac: RRR; no murmurs, rubs, or gallops,no edema ; no diastolic murmur audible Respiratory:  clear to auscultation bilaterally, normal work of breathing GI: soft, nontender, nondistended, + BS MS: no deformity or atrophy Skin: warm and dry, redness on shins Neuro:  Strength and sensation are intact Psych: euthymic mood, full affect   EKG:   The ekg ordered today demonstrates NSR, poor R wave progression   Recent Labs: 08/21/2015: Hemoglobin 12.6; Platelets 196.0 12/30/2015: ALT 17; BUN 20; Creat 0.80; Potassium 3.8; Sodium 139   Lipid Panel    Component Value Date/Time   CHOL 197 12/30/2015 0741   TRIG 85 12/30/2015 0741   HDL 87 12/30/2015 0741   CHOLHDL 2.3 12/30/2015 0741   VLDL 17 12/30/2015 0741   LDLCALC 93 12/30/2015 0741   LDLDIRECT 117.0 02/05/2014 0812     Other studies Reviewed: Additional studies/ records that were reviewed today with results demonstrating: eco reviewed.   ASSESSMENT AND PLAN:  1. Orthostatic Hypotension: Improved with increased salt and water intake.   2. Aortic insufficiency: no murmur on exam.  No CHF sx. No need for further testing at this time. 3. HTN: She would like to stop losartan due to itchy legs and rash.  Will have her follow-up in the Pharm.D. hypertension clinic. If her blood pressure is high, they can start a new medication. Will also see if the rash improves after being off of the losartan. 4. PVC: COntinue beta  blocker. 5. PMD was previously Samantha Gregory.  She will be seen at a [place] [facility] ofice, a female but she did not remember the name.     Current medicines are reviewed at length with the patient today.  The patient concerns regarding her medicines were addressed.  The following changes have been made:  Stop losartan  Labs/ tests ordered today include:   Orders Placed This Encounter  Procedures  . EKG 12-Lead    Recommend 150 minutes/week of aerobic exercise Low fat, low carb, high fiber diet recommended  Disposition:   FU in 6 months   Teresita Madura., MD  04/20/2016 9:16 AM    [facility] [address], [place], [place]  [address] Phone: [phone];  Fax: [phone]

## 2016-05-10 ENCOUNTER — Encounter: Payer: Self-pay | Admitting: Pharmacist

## 2016-05-10 ENCOUNTER — Ambulatory Visit (INDEPENDENT_AMBULATORY_CARE_PROVIDER_SITE_OTHER): Payer: PPO | Admitting: Pharmacist

## 2016-05-10 VITALS — BP 132/84 | HR 73

## 2016-05-10 DIAGNOSIS — I10 Essential (primary) hypertension: Secondary | ICD-10-CM

## 2016-05-10 NOTE — Progress Notes (Signed)
Patient ID: Samantha Gregory                 DOB: 13-Jan-1935                      MRN: [id number]     HPI: Samantha Gregory is a 81 y.o. female referred by Samantha Gregory to HTN clinic. PMH significant for HTN, osteoporosis, and HLD. Recently stopped taking losartan per Samantha Gregory due to suspected medication-induced rash. Pt has been off medication for approximately 3 weeks and reports no improvement. Small areas of erythema on lower legs that make her "want to scratch through her skin." Currently being treated with triamcinolone and fluocinonide cream without relief. Reports a long history of food and medication allergies. Does not check BP at home and reports having two large cups of coffee approximately 1 hr prior to appointment today.   Current HTN meds: Metoprolol tartrate 25mg  BID prn for tachycardia  Previously tried: stopped losartan 12.5mg  approx 3 weeks ago due to rash  BP goal: <150/70mmHg  Wt Readings from Last 3 Encounters:  04/20/16 94 lb 12.8 oz (43.001 kg)  01/01/16 95 lb 12.8 oz (43.455 kg)  10/08/15 94 lb 1.9 oz (42.693 kg)   BP Readings from Last 3 Encounters:  05/10/16 132/84  04/20/16 132/78  01/01/16 130/90   Pulse Readings from Last 3 Encounters:  05/10/16 73  04/20/16 71  01/01/16 72    Renal function: CrCl cannot be calculated (Unknown ideal weight.).  Past Medical History  Diagnosis Date  . Hypercholesterolemia   . Heart palpitations   . Mitral regurgitation   . Paroxysmal atrial tachycardia [facility])     Current Outpatient Prescriptions on File Prior to Visit  Medication Sig Dispense Refill  . aspirin 81 MG tablet Take 1 tablet (81 mg total) by mouth every other day.    . cholecalciferol (VITAMIN D) 1000 UNITS tablet Take 1,000 Units by mouth daily.    . metoprolol tartrate (LOPRESSOR) 25 MG tablet Take 1 tablet (25 mg total) by mouth 2 (two) times daily as needed (Use as needed for rapid heart beat.). 60 tablet 3  . Multiple Vitamins-Iron  (MULTIVITAMIN/IRON PO) Take 1 tablet by mouth daily.      No current facility-administered medications on file prior to visit.    Allergies  Allergen Reactions  . Statins Other (See Comments)    Fuzzy in the head     Assessment/Plan:  1.) Hypertension: BP 132/84 at goal of <150/90 mmHg off losartan 12.5mg  daily. Rash has not resolved with discontinuation. Recommended patient try 2nd generation antihistamine daily and wear long pants when doing yard work. Advised to continue to search for a PCP. Will permit pt to remain off losartan as her blood pressure is at goal. Follow up as needed.    Samantha Gregory, PharmD Clinical Pharmacy Resident 10:59 AM, 05/10/2016

## 2016-05-10 NOTE — Patient Instructions (Signed)
Your blood pressure today looks great  You can continue to stay off your losartan (blood pressure medication)  Follow up with Dr. Irish Lack as needed

## 2016-05-20 ENCOUNTER — Ambulatory Visit
Admission: RE | Admit: 2016-05-20 | Discharge: 2016-05-20 | Disposition: A | Discharge status: Home / Self Care | Payer: PPO | Source: Ambulatory Visit | Attending: Otolaryngology | Admitting: Otolaryngology

## 2016-05-20 ENCOUNTER — Other Ambulatory Visit (HOSPITAL_COMMUNITY): Payer: Self-pay | Admitting: Otolaryngology

## 2016-05-20 ENCOUNTER — Other Ambulatory Visit: Payer: Self-pay | Admitting: Otolaryngology

## 2016-05-20 DIAGNOSIS — R490 Dysphonia: Secondary | ICD-10-CM | POA: Diagnosis not present

## 2016-05-20 DIAGNOSIS — J410 Simple chronic bronchitis: Secondary | ICD-10-CM | POA: Diagnosis not present

## 2016-05-20 DIAGNOSIS — R130 Aphagia: Secondary | ICD-10-CM | POA: Diagnosis not present

## 2016-05-20 DIAGNOSIS — J040 Acute laryngitis: Secondary | ICD-10-CM

## 2016-05-20 DIAGNOSIS — R05 Cough: Secondary | ICD-10-CM | POA: Diagnosis not present

## 2016-05-20 DIAGNOSIS — H9042 Sensorineural hearing loss, unilateral, left ear, with unrestricted hearing on the contralateral side: Secondary | ICD-10-CM | POA: Diagnosis not present

## 2016-05-20 DIAGNOSIS — J039 Acute tonsillitis, unspecified: Secondary | ICD-10-CM | POA: Diagnosis not present

## 2016-05-20 DIAGNOSIS — J322 Chronic ethmoidal sinusitis: Secondary | ICD-10-CM | POA: Diagnosis not present

## 2016-05-30 ENCOUNTER — Encounter (HOSPITAL_COMMUNITY): Payer: Self-pay

## 2016-05-30 ENCOUNTER — Other Ambulatory Visit (HOSPITAL_COMMUNITY): Payer: Self-pay | Admitting: Otolaryngology

## 2016-05-30 ENCOUNTER — Ambulatory Visit (HOSPITAL_COMMUNITY)
Admission: RE | Admit: 2016-05-30 | Discharge: 2016-05-30 | Disposition: A | Discharge status: Home / Self Care | Payer: PPO | Source: Ambulatory Visit | Attending: Otolaryngology | Admitting: Otolaryngology

## 2016-05-30 DIAGNOSIS — R130 Aphagia: Secondary | ICD-10-CM | POA: Insufficient documentation

## 2016-05-30 DIAGNOSIS — R930 Abnormal findings on diagnostic imaging of skull and head, not elsewhere classified: Secondary | ICD-10-CM | POA: Diagnosis not present

## 2016-05-30 DIAGNOSIS — J040 Acute laryngitis: Secondary | ICD-10-CM

## 2016-05-30 DIAGNOSIS — I779 Disorder of arteries and arterioles, unspecified: Secondary | ICD-10-CM | POA: Diagnosis not present

## 2016-05-30 DIAGNOSIS — R490 Dysphonia: Secondary | ICD-10-CM

## 2016-05-30 DIAGNOSIS — R221 Localized swelling, mass and lump, neck: Secondary | ICD-10-CM | POA: Diagnosis not present

## 2016-05-30 LAB — CREATININE, SERUM
Creatinine, Ser: 0.81 mg/dL (ref 0.44–1.00)
GFR calc Af Amer: >60 mL/min (ref >60)
GFR calc non Af Amer: >60 mL/min (ref >60)

## 2016-05-30 LAB — BUN: BUN: 20 mg/dL (ref 6–20)

## 2016-05-30 MED ORDER — IOPAMIDOL (ISOVUE-300) INJECTION 61%
INTRAVENOUS | Status: AC
  Administered 2016-05-30: 75 mL
  Filled 2016-05-30: qty 75

## 2016-06-16 DIAGNOSIS — J301 Allergic rhinitis due to pollen: Secondary | ICD-10-CM | POA: Diagnosis not present

## 2016-06-16 DIAGNOSIS — J370 Chronic laryngitis: Secondary | ICD-10-CM | POA: Diagnosis not present

## 2016-06-16 DIAGNOSIS — J3081 Allergic rhinitis due to animal (cat) (dog) hair and dander: Secondary | ICD-10-CM | POA: Diagnosis not present

## 2016-06-16 DIAGNOSIS — R490 Dysphonia: Secondary | ICD-10-CM | POA: Diagnosis not present

## 2016-06-22 DIAGNOSIS — B078 Other viral warts: Secondary | ICD-10-CM | POA: Diagnosis not present

## 2016-06-22 DIAGNOSIS — L298 Other pruritus: Secondary | ICD-10-CM | POA: Diagnosis not present

## 2016-06-22 DIAGNOSIS — L820 Inflamed seborrheic keratosis: Secondary | ICD-10-CM | POA: Diagnosis not present

## 2016-06-22 DIAGNOSIS — L400 Psoriasis vulgaris: Secondary | ICD-10-CM | POA: Diagnosis not present

## 2016-06-22 DIAGNOSIS — I872 Venous insufficiency (chronic) (peripheral): Secondary | ICD-10-CM | POA: Diagnosis not present

## 2016-08-10 ENCOUNTER — Telehealth: Payer: Self-pay | Admitting: Interventional Cardiology

## 2016-08-10 MED ORDER — FUROSEMIDE 20 MG PO TABS: 20.0000 mg | ORAL_TABLET | Freq: Every day | ORAL | 3 refills | Status: DC

## 2016-08-10 NOTE — Telephone Encounter (Signed)
The pt is advised and she states that she is reluctant to take Furosemide because she "hates to take more medications".  I have encouraged her to try Furosemide as it will help reduce the swelling in her feet, ankles and knees. She states that she will talk with her pharmacist and may give Furosemide a try. I strongly encouraged her to take Furosemide.  Also she and I scheduled her a f/u with Melina Copa, PA-c on 8/29 at 8:30 and the pt is requesting to have BMET drawn at that visit.

## 2016-08-10 NOTE — Telephone Encounter (Signed)
New Message  Pt c/o swelling: STAT is pt has developed SOB within 24 hours  1. How long have you been experiencing swelling? Pt states a couple of days   2. Where is the swelling located? Knees ankles and feet  3.  Are you currently taking a "fluid pill"? Per pt does not take fluid pills   4.  Are you currently SOB? no  5.  Have you traveled recently? No   Comment: pt states she wanted to schedule a sooner appt with Dr. Irish Lack. Pt refused APP appt. Please call back to discuss

## 2016-08-10 NOTE — Telephone Encounter (Signed)
**Note De-Identified Samantha Gregory Obfuscation** The pt states that she is having a lot of swelling and pain in her feet, ankles and knees for the past 3 days. She denies SOB, weight gain, dizziness, weakness or CP.  She is concerned that this maybe a vein issue as she has never had swelling before in her feet, ankles or knees.  She states that she has no other MDs and wants to know what Dr Irish Lack recommends that she do as she states she has a lot of work to do and cannot continue stopping to elevate her legs.  Please advise.

## 2016-08-10 NOTE — Telephone Encounter (Signed)
Would have her see an APP.  For now, can start furosemide 20 mg daily. BMet in one week or at AP visit.

## 2016-08-22 ENCOUNTER — Encounter: Payer: Self-pay | Admitting: Physician Assistant

## 2016-08-22 DIAGNOSIS — I493 Ventricular premature depolarization: Secondary | ICD-10-CM | POA: Insufficient documentation

## 2016-08-22 NOTE — Progress Notes (Signed)
Cardiology Office Note    Date:  08/23/2016  ID:  Aamiyah, Veh[place] February 20, 1935, MRN [id number] PCP:  Warren Danes, MD - previously Cardiologist:  Dr. Irish Lack  Chief Complaint: veins itch/protrude  History of Present Illness:  Samantha Gregory is a 81 y.o. female with history of orthostatic hypotension, AI/MR, possible prior TIA, occasional PVCs, hypercholesterolemia (intolerant of statins due to memory issues), paroxysmal atrial tachycardia, aortic insufficiency, HTN who presents for evaluation of edema. Last echo was TEE 08/2015 showing EF 55-60%, no RWMA, moderate AI, mild-mod MR, mild LAE, trivial pericardial effusion (2D showed grade 1 DD same time).   She presents today for evaluation of leg problems. 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 Last month she spent several weeks doing heavy physical labor cutting down trees and pulling limbs. She did not have any chest pain, SOB or fatigue doing so. However, since that time, she's noticed the veins in her legs seem more prominent than usual. She has lots of spider veins on her feet as well. No nonhealing wounds or claudication. No syncope. She has rare bursts of palpitations when she is feeling stressed - she was unable to quantify how frequently they were occurring but they have not been particularly bothersome. They are short lived and resolve with metoprolol. She feels there is edema present but when she says this she is referring to the swelling of her veins rather than her actual legs. She has been instructed by dermatology to elevate her legs and use compression hose but she has seen no relief. States she eats well including protein intake.   Her biggest complaint above all is "not having enough hours in the day" to keep up with her to-do list.   Past Medical History:  Diagnosis Date  . Essential hypertension   . Heart  palpitations   . Hypercholesterolemia   . Mitral regurgitation   . Orthostatic hypotension   . Paroxysmal atrial tachycardia ([place])   . PVC's (premature ventricular contractions)     Past Surgical History:  Procedure Laterality Date  . APPENDECTOMY    . TEE WITHOUT CARDIOVERSION N/A 09/08/2015   Procedure: TRANSESOPHAGEAL ECHOCARDIOGRAM (TEE);  Surgeon: Lelon Perla, MD;  Location: [facility] ENDOSCOPY;  Service: Cardiovascular;  Laterality: N/A;    Current Medications: Current Outpatient Prescriptions  Medication Sig Dispense Refill  . aspirin 81 MG tablet Take 1 tablet (81 mg total) by mouth every other day.    . cholecalciferol (VITAMIN D) 1000 UNITS tablet Take 1,000 Units by mouth daily.    . metoprolol tartrate (LOPRESSOR) 25 MG tablet Take 1 tablet (25 mg total) by mouth 2 (two) times daily as needed (Use as needed for rapid heart beat.). 60 tablet 3   No current facility-administered medications for this visit.      Allergies:   Statins   Social History   Social History  . Marital status: Widowed    Spouse name: N/A  . Number of children: N/A  . Years of education: N/A   Social History Main Topics  . Smoking status: Never Smoker  . Smokeless tobacco: None  . Alcohol use Yes  . Drug use: Unknown  . Sexual activity: Not Asked   Other Topics Concern  . None   Social History Narrative  . None     Family History:  The patient's family history includes Heart attack in her father; Heart  disease in her brother, sister, and sister; Hypertension in her brother; Kidney disease in her mother; Stroke in her mother, paternal aunt, and paternal uncle.   ROS:   Please see the history of present illness.  All other systems are reviewed and otherwise negative.    PHYSICAL EXAM:   VS:  BP 138/84   Pulse 60   Ht 5\' 5"  (1.651 m)   Wt 91 lb 6.4 oz (41.5 kg)   BMI 15.21 kg/m   BMI: Body mass index is 15.21 kg/m. GEN: Thin WF in no acute distress  HEENT: normocephalic,  atraumatic Neck: no JVD, carotid bruits, or masses Cardiac: RRR; no murmurs, rubs, or gallops, no edema  Respiratory:  clear to auscultation bilaterally, normal work of breathing GI: soft, nontender, nondistended, + BS MS: no deformity or atrophy  Skin: warm and dry. Bilateral lower extremities exhibit significant varicose veins as well as spider veins of the feet, venous stasis hyperpigmentation. No LE edema. She has a small healing abrasion on her right lateral shin - states she had brushed up against something (no active suppuration) Neuro:  Alert and Oriented x 3, Strength and sensation are intact, follows commands Psych: euthymic mood, full affect  Wt Readings from Last 3 Encounters:  08/23/16 91 lb 6.4 oz (41.5 kg)  04/20/16 94 lb 12.8 oz (43 kg)  01/01/16 95 lb 12.8 oz (43.5 kg)      Studies/Labs Reviewed:   EKG:  EKG was ordered today and personally reviewed by me and demonstrates sinus bradycardia 60pm, LVH, no acute changes, baseline wander, TWI avL  Recent Labs: 12/30/2015: ALT 17; Potassium 3.8; Sodium 139 05/30/2016: BUN 20; Creatinine, Ser 0.81   Lipid Panel    Component Value Date/Time   CHOL 197 12/30/2015 0741   TRIG 85 12/30/2015 0741   HDL 87 12/30/2015 0741   CHOLHDL 2.3 12/30/2015 0741   VLDL 17 12/30/2015 0741   LDLCALC 93 12/30/2015 0741   LDLDIRECT 117.0 02/05/2014 0812    Additional studies/ records that were reviewed today include: Summarized above.   ASSESSMENT & PLAN:   1. Varicose veins - symptomatic with dull achiness and itching. Has not seen relief with conservative measures of elevation/compression. No evidence of CHF on exam. She reports eating a good diet with adequate protein intake. She may be a candidate for vein stripping/ablation - will refer to [facility]. 2. Essential HTN with h/o orthostasis - BP modestly elevated. However, given h/o orthostasis, will follow for now. She was previously able to come off BP medicine due  to BP dropping. 3. Aortic insufficiency/mitral regurgitation - follow clinically for now. No sig murmur on today's exam. 4. Paroxysmal atrial tachycardia - she continues to have infrequent episodes, but states they are well controlled with metoprolol PRN. I discussed possibility of other arrhythmias occurring, such as afib - she declines repeat event monitoring at this time, citing she would prefer to hold off unless she begins having more frequent episodes. She will let us know if she changes her mind.  Disposition: F/u with Dr. Irish Lack as planned.  Medication Adjustments/Labs and Tests Ordered: Current medicines are reviewed at length with the patient today.  Concerns regarding medicines are outlined above. Medication changes, Labs and Tests ordered today are summarized above and listed in the Patient Instructions accessible in Encounters.   Samantha Ache PA-C  08/23/2016 8:47 AM    [facility] [address] [place], [place], [place]  [address] Phone: [phone];  Fax: [phone]  

## 2016-08-23 ENCOUNTER — Encounter: Payer: Self-pay | Admitting: Physician Assistant

## 2016-08-23 ENCOUNTER — Ambulatory Visit (INDEPENDENT_AMBULATORY_CARE_PROVIDER_SITE_OTHER): Payer: PPO | Admitting: Physician Assistant

## 2016-08-23 VITALS — BP 138/84 | HR 60 | Ht 65.0 in | Wt 91.4 lb

## 2016-08-23 DIAGNOSIS — I351 Nonrheumatic aortic (valve) insufficiency: Secondary | ICD-10-CM | POA: Diagnosis not present

## 2016-08-23 DIAGNOSIS — I8390 Asymptomatic varicose veins of unspecified lower extremity: Secondary | ICD-10-CM

## 2016-08-23 DIAGNOSIS — I868 Varicose veins of other specified sites: Secondary | ICD-10-CM | POA: Diagnosis not present

## 2016-08-23 DIAGNOSIS — I340 Nonrheumatic mitral (valve) insufficiency: Secondary | ICD-10-CM | POA: Diagnosis not present

## 2016-08-23 DIAGNOSIS — I471 Supraventricular tachycardia: Secondary | ICD-10-CM

## 2016-08-23 DIAGNOSIS — I10 Essential (primary) hypertension: Secondary | ICD-10-CM | POA: Diagnosis not present

## 2016-08-23 NOTE — Patient Instructions (Addendum)
Schedule appointment with [facility] for varicose veins   Keep appointment with Dr.Varanasi 09/26/16 at 8:45 am

## 2016-09-01 ENCOUNTER — Other Ambulatory Visit: Payer: Self-pay | Admitting: Vascular Surgery

## 2016-09-01 DIAGNOSIS — I83893 Varicose veins of bilateral lower extremities with other complications: Secondary | ICD-10-CM

## 2016-09-20 DIAGNOSIS — Z961 Presence of intraocular lens: Secondary | ICD-10-CM | POA: Diagnosis not present

## 2016-09-20 DIAGNOSIS — H5211 Myopia, right eye: Secondary | ICD-10-CM | POA: Diagnosis not present

## 2016-09-20 DIAGNOSIS — D3132 Benign neoplasm of left choroid: Secondary | ICD-10-CM | POA: Diagnosis not present

## 2016-09-25 NOTE — Progress Notes (Signed)
Cardiology Office Note   Date:  09/26/2016   ID:  Samantha Gregory, [place] 1935-06-14, MRN [id number]  PCP:  Samantha Danes, MD    No chief complaint on file.    Wt Readings from Last 3 Encounters:  09/26/16 93 lb (42.2 kg)  08/23/16 91 lb 6.4 oz (41.5 kg)  04/20/16 94 lb 12.8 oz (43 kg)       History of Present Illness: Samantha Gregory is a 81 y.o. female  Who has a history of orthostatic hypotension. This has improved with liberalization of salt and fluid intake. She saw Dr. Mare Gregory in the past.   In 2001 she had a questionable TIA and had an echocardiogram with bubble study which did not show any embolic source. She has had previous carotid Dopplers which are normal. The patient is no longer on statins. She has tried most of the statins. She had to go off the statins because it was causing her to have decreased memory. Fortunately her HDL level is satisfactory and her risk ratio is not excessive.  She has a history of occasional PVCs. She drinks coffee in the morning but not the rest of the day.  She had been under a lot of stress. 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 She has tried to get the patient into one of the veterans hospitals with no success. Samantha Gregory is still going over to check on him and help with nursing care 2 times a day and is wearing herself out.  Two weeks ago, [facility].  She visits him daily for now.  She will do this until he gets settled.  Then she will go less.  Her stress has decreased.  She was recently seen complaining of shortness of breath.  Echo in 9/16 showed:  Normal LV size with low normal to mildly reduced systolic function, EF 99991111. Normal RV size and systolic function. Trileaflet aortic valve with significant aortic regurgitation. In the apical three chamber view, the aortic regurgitation appeared moderate to severe. However, a short pressure  halftime of the AR signal was never obtained. There was diastolic flow reversal in the descending thoracic aorta doppler signal but it did not appear holodiastolic. Would consider TEE if patient is symptomatic.  She has not been walking.  She was intolerant of losartan and seen in the Pharm[facility] HTN clinic.  Rash did not improve off of losartan.  BP was at goal off of losartan.  She has noticed more prominent veins in the legs.  She has noticed some ankle swelling.  She has an appt with [facility] for her venous insufficiency.  She is active working in her yard as well.  She has stress from taking care of an elderly gentleman. He wants to marry her. She is considering this as there are pension issues as well. They have been friends for many years.  He is in hospice and will likely not return home. This has been a source of stress for her.    Past Medical History:  Diagnosis Date  . Essential hypertension   . Heart palpitations   . Hypercholesterolemia   . Mitral regurgitation   . Orthostatic hypotension   . Paroxysmal atrial tachycardia ([place])   . PVC's (premature ventricular contractions)     Past Surgical History:  Procedure Laterality Date  . APPENDECTOMY    . TEE WITHOUT CARDIOVERSION N/A 09/08/2015   Procedure: TRANSESOPHAGEAL ECHOCARDIOGRAM (TEE);  Surgeon: Lelon Perla, MD;  Location: [facility] ENDOSCOPY;  Service: Cardiovascular;  Laterality: N/A;     Current Outpatient Prescriptions  Medication Sig Dispense Refill  . aspirin 81 MG tablet Take 1 tablet (81 mg total) by mouth every other day.    . cholecalciferol (VITAMIN D) 1000 UNITS tablet Take 1,000 Units by mouth daily.    . metoprolol tartrate (LOPRESSOR) 25 MG tablet Take 1 tablet (25 mg total) by mouth 2 (two) times daily as needed (Use as needed for rapid heart beat.). 60 tablet 3   No current facility-administered medications for this visit.     Allergies:   Statins    Social History:  The patient  reports  that she has never smoked. She does not have any smokeless tobacco history on file. She reports that she drinks alcohol.   Family History:  The patient's family history includes Heart attack in her father; Heart disease in her brother, sister, and sister; Hypertension in her brother; Kidney disease in her mother; Stroke in her mother, paternal aunt, and paternal uncle.    ROS:  Please see the history of present illness.   Otherwise, review of systems are positive for itching legs, tachycardia, stress 6.   All other systems are reviewed and negative.    PHYSICAL EXAM: VS:  BP (!) 142/79   Pulse 64   Ht 5\' 5"  (1.651 m)   Wt 93 lb (42.2 kg)   BMI 15.48 kg/m  , BMI Body mass index is 15.48 kg/m. GEN: Well nourished, well developed, in no acute distress  HEENT: normal  Neck: no JVD, carotid bruits, or masses Cardiac: RRR; no murmurs, rubs, or gallops,no edema  Respiratory:  clear to auscultation bilaterally, normal work of breathing GI: soft, nontender, nondistended, + BS MS: no deformity or atrophy  Skin: warm and dry, no rash Neuro:  Strength and sensation are intact Psych: euthymic mood, full affect   EKG:   The ekg ordered in 8/17 demonstrates NSR, LVH   Recent Labs: 12/30/2015: ALT 17; Potassium 3.8; Sodium 139 05/30/2016: BUN 20; Creatinine, Ser 0.81   Lipid Panel    Component Value Date/Time   CHOL 197 12/30/2015 0741   TRIG 85 12/30/2015 0741   HDL 87 12/30/2015 0741   CHOLHDL 2.3 12/30/2015 0741   VLDL 17 12/30/2015 0741   LDLCALC 93 12/30/2015 0741   LDLDIRECT 117.0 02/05/2014 0812     Other studies Reviewed: Additional studies/ records that were reviewed today with results demonstrating: echo results.   ASSESSMENT AND PLAN:  1. Orthostatic Hypotension: Improved with increased salt and water intake.  Would not decrease salt intake since these sx are controlled.  She is active looking after an elderly friend.  He wants to marry her.  He is on hospice. BP  reasonably controlled.   2. Aortic insufficiency: no murmur on exam.  No CHF sx. No need for further testing at this time. 3. HTN: Off of losartan now due to itchy legs and rash; did not change with stopping of the medicine. BP reasonably controlled.  If needed, she would be willing to try losartan again.  WIll hold off at this time.   4. PVC: COntinue beta blocker.   THis also helps her tachycardia sx. 5. Will refer to a PMD. 6. Check labs in Jan 2018.   Current medicines are reviewed at length with the patient today.  The patient concerns regarding her medicines were addressed.  The following changes have been made:  No change  Labs/ tests ordered today include: No orders of the defined types were placed in this encounter.   Recommend 150 minutes/week of aerobic exercise Low fat, low carb, high fiber diet recommended  Disposition:   FU in 9 months   Signed, Larae Grooms, MD  09/26/2016 8:58 AM    [facility] [place], [place],   [address] Phone: [phone]; Fax: [phone]

## 2016-09-26 ENCOUNTER — Encounter: Payer: Self-pay | Admitting: Interventional Cardiology

## 2016-09-26 ENCOUNTER — Ambulatory Visit (INDEPENDENT_AMBULATORY_CARE_PROVIDER_SITE_OTHER): Payer: PPO | Admitting: Interventional Cardiology

## 2016-09-26 VITALS — BP 142/79 | HR 64 | Ht 65.0 in | Wt 93.0 lb

## 2016-09-26 DIAGNOSIS — I351 Nonrheumatic aortic (valve) insufficiency: Secondary | ICD-10-CM

## 2016-09-26 DIAGNOSIS — I493 Ventricular premature depolarization: Secondary | ICD-10-CM | POA: Diagnosis not present

## 2016-09-26 DIAGNOSIS — E7800 Pure hypercholesterolemia, unspecified: Secondary | ICD-10-CM | POA: Diagnosis not present

## 2016-09-26 DIAGNOSIS — I951 Orthostatic hypotension: Secondary | ICD-10-CM

## 2016-09-26 DIAGNOSIS — I10 Essential (primary) hypertension: Secondary | ICD-10-CM

## 2016-09-26 NOTE — Patient Instructions (Addendum)
Medication Instructions:  Same-no changes  Labwork: None  Testing/Procedures: None  Follow-Up: Your physician wants you to follow-up in: 9 months. You will receive a reminder letter in the mail two months in advance. If you don't receive a letter, please call our office to schedule the follow-up appointment.   Any Other Special Instructions Will Be Listed Below (If Applicable). Please contact [facility] @ [phone] or [facility] [place] @ [phone] to obtain a primary care doctor.    If you need a refill on your cardiac medications before your next appointment, please call your pharmacy.

## 2016-10-14 ENCOUNTER — Encounter (HOSPITAL_COMMUNITY): Payer: PPO

## 2016-10-14 ENCOUNTER — Encounter: Payer: PPO | Admitting: Vascular Surgery

## 2016-11-09 DIAGNOSIS — I872 Venous insufficiency (chronic) (peripheral): Secondary | ICD-10-CM | POA: Diagnosis not present

## 2016-11-09 DIAGNOSIS — B078 Other viral warts: Secondary | ICD-10-CM | POA: Diagnosis not present

## 2016-11-16 ENCOUNTER — Encounter: Payer: PPO | Admitting: Vascular Surgery

## 2016-11-16 ENCOUNTER — Encounter (HOSPITAL_COMMUNITY): Payer: PPO

## 2016-11-25 ENCOUNTER — Encounter (HOSPITAL_COMMUNITY): Payer: PPO

## 2016-11-25 ENCOUNTER — Encounter: Payer: PPO | Admitting: Vascular Surgery

## 2016-12-02 ENCOUNTER — Encounter: Payer: PPO | Admitting: Vascular Surgery

## 2016-12-02 ENCOUNTER — Encounter (HOSPITAL_COMMUNITY): Payer: PPO

## 2016-12-27 ENCOUNTER — Other Ambulatory Visit: Payer: PPO | Admitting: *Deleted

## 2016-12-27 DIAGNOSIS — E7800 Pure hypercholesterolemia, unspecified: Secondary | ICD-10-CM | POA: Diagnosis not present

## 2016-12-28 LAB — COMPREHENSIVE METABOLIC PANEL
ALK PHOS: 112 IU/L (ref 39–117)
ALT: 15 IU/L (ref 0–32)
AST: 27 IU/L (ref 0–40)
Albumin/Globulin Ratio: 1.6 (ref 1.2–2.2)
Albumin: 4.1 g/dL (ref 3.5–4.8)
BILIRUBIN TOTAL: 0.5 mg/dL (ref 0.0–1.2)
BUN/Creatinine Ratio: 27 (ref 12–28)
BUN: 20 mg/dL (ref 8–27)
CO2: 28 mmol/L (ref 18–29)
CREATININE: 0.73 mg/dL (ref 0.57–1.00)
Calcium: 9.3 mg/dL (ref 8.7–10.3)
Chloride: 100 mmol/L (ref 96–106)
GFR calc non Af Amer: 79 mL/min/{1.73_m2} (ref >59)
GFR, EST AFRICAN AMERICAN: 91 mL/min/{1.73_m2} (ref >59)
Globulin, Total: 2.6 g/dL (ref 1.5–4.5)
Glucose: 87 mg/dL (ref 65–99)
POTASSIUM: 4.2 mmol/L (ref 3.5–5.2)
SODIUM: 140 mmol/L (ref 134–144)
TOTAL PROTEIN: 6.7 g/dL (ref 6.0–8.5)

## 2016-12-28 LAB — LIPID PANEL
CHOL/HDL RATIO: 2.5 ratio (ref 0.0–4.4)
Cholesterol, Total: 222 mg/dL — ABNORMAL HIGH (ref 100–199)
HDL: 88 mg/dL (ref >39)
LDL Calculated: 115 mg/dL — ABNORMAL HIGH (ref 0–99)
TRIGLYCERIDES: 93 mg/dL (ref 0–149)
VLDL Cholesterol Cal: 19 mg/dL (ref 5–40)

## 2017-01-27 ENCOUNTER — Encounter: Payer: Self-pay | Admitting: Vascular Surgery

## 2017-01-27 DIAGNOSIS — L308 Other specified dermatitis: Secondary | ICD-10-CM | POA: Diagnosis not present

## 2017-02-03 ENCOUNTER — Encounter: Payer: PPO | Admitting: Vascular Surgery

## 2017-02-03 ENCOUNTER — Ambulatory Visit (HOSPITAL_COMMUNITY): Payer: PPO

## 2017-02-07 DIAGNOSIS — L308 Other specified dermatitis: Secondary | ICD-10-CM | POA: Diagnosis not present

## 2017-02-07 DIAGNOSIS — L820 Inflamed seborrheic keratosis: Secondary | ICD-10-CM | POA: Diagnosis not present

## 2017-02-20 ENCOUNTER — Telehealth: Payer: Self-pay | Admitting: Interventional Cardiology

## 2017-02-20 NOTE — Telephone Encounter (Signed)
Patient is calling to verify whether or not she needs to come in to be seen by Dr. Irish Lack in regards to the results from the blood work. I did inform patient of recall date but patient would like to know if she needs to come in sooner. Please verify with patient.

## 2017-02-20 NOTE — Telephone Encounter (Signed)
Patient concerned because she hadn't been back to the Dr sooner.  Last note said 9 months,  around 06-25-17, put in recall queue.  I explained that she would be notified before that date to schedule her return visit.  She expressed understanding.

## 2017-02-24 DIAGNOSIS — Z1211 Encounter for screening for malignant neoplasm of colon: Secondary | ICD-10-CM | POA: Diagnosis not present

## 2017-02-24 DIAGNOSIS — R1030 Lower abdominal pain, unspecified: Secondary | ICD-10-CM | POA: Diagnosis not present

## 2017-03-08 ENCOUNTER — Telehealth: Payer: Self-pay | Admitting: Interventional Cardiology

## 2017-03-08 NOTE — Telephone Encounter (Signed)
The patient states that when she tries to get up out of bed that she becomes very dizzy and it feels like the room is spinning. She states this only happens when she gets up from the right side of her bed and that it does not happen when she gets up on the left side. She states that the last time it happened was two mornings ago. The patient states that she has not checked her BP. The patient states that she also has a fast HR in the mornings which has been going on for years. She states that she just takes 1/2 of her 25 mg tablet of metoprolol and it gets better. I asked the patient if she has increased her fluid and salt intake as advised at her last OV. The patient states that she is drinking plenty of water and eating plenty of salt. Patient was advised to slowly get up and sit on the side of the bed for a few minutes before standing. The patient states that she has tried this and that her only problems are when she gets up on the right side of the bed. Message will be forwarded to Dr. Irish Lack for review and recommendations.

## 2017-03-08 NOTE — Telephone Encounter (Signed)
Continue the fluid and salt intake.  Should improve over time.  Be careful to avoid falling.  No change in meds at this time.

## 2017-03-08 NOTE — Telephone Encounter (Signed)
Patient made aware of Dr. Varanasi's recommendations. Patient verbalized understanding. 

## 2017-03-08 NOTE — Telephone Encounter (Signed)
New message    Pt is calling complaining of being dizzy when she wakes up. She states she can not get out of bed on the right side. She said she isn't sure if it's heart related but it has happened two mornings in a row. She said she feels like her heart is beating fast but that is not unusual in the mornings.

## 2017-03-09 ENCOUNTER — Encounter (INDEPENDENT_AMBULATORY_CARE_PROVIDER_SITE_OTHER): Payer: Self-pay

## 2017-03-09 ENCOUNTER — Ambulatory Visit (INDEPENDENT_AMBULATORY_CARE_PROVIDER_SITE_OTHER): Payer: PPO | Admitting: Cardiovascular Disease

## 2017-03-09 VITALS — BP 154/78 | HR 57 | Ht 64.0 in | Wt 94.4 lb

## 2017-03-09 DIAGNOSIS — H8143 Vertigo of central origin, bilateral: Secondary | ICD-10-CM | POA: Diagnosis not present

## 2017-03-09 DIAGNOSIS — I351 Nonrheumatic aortic (valve) insufficiency: Secondary | ICD-10-CM

## 2017-03-09 DIAGNOSIS — R000 Tachycardia, unspecified: Secondary | ICD-10-CM

## 2017-03-09 DIAGNOSIS — R42 Dizziness and giddiness: Secondary | ICD-10-CM | POA: Diagnosis not present

## 2017-03-09 NOTE — Progress Notes (Signed)
Chief Complaint  Patient presents with  . elevated heart rate  . Dizziness     History of Present Illness: 82 yo female with history of orthostatic hypotension, HTN, HLD, mitral regurgitation, paroxysmal atrial tachycardia, PVCs who is added onto my schedule today with c/o palpitations. She is followed by Dr. Irish Lack. She was seen in the ENT office this am and c/o dizziness. Her ENT exam was normal. Her ENT specialist Dr. Ernesto Rutherford noted tachycardia and called our office today to see if we could add her onto our schedule today. Her heart rate was noted to be 120 bpm in the ENT office. She is known to have normal LV systolic function by echo 2016 with aortic insufficiency. She has been managed with a beta blocker for PVCs. We were called today to see her due to tachycardia. She is found to have a heart rate of 57 bpm upon arrival to our office. This is after taking extra 12.5 mg Lopressor. She tells me that her foot hurts. She wants to know how she can see an orthopedic surgeon. She tells me that she feels her heart racing every morning. This lasts for several hours. She feels tired. She has no energy. She has some dizziness over the last 3 days. This has been a chronic issue but has gotten worse lately. She describes the "room spinning" while lying in bed but she was told by ENT today that she did not have vertigo.   Primary Care Physician: No PCP Per Patient   Past Medical History:  Diagnosis Date  . Essential hypertension   . Heart palpitations   . Hypercholesterolemia   . Mitral regurgitation   . Orthostatic hypotension   . Paroxysmal atrial tachycardia ([place])   . PVC's (premature ventricular contractions)     Past Surgical History:  Procedure Laterality Date  . APPENDECTOMY    . TEE WITHOUT CARDIOVERSION N/A 09/08/2015   Procedure: TRANSESOPHAGEAL ECHOCARDIOGRAM (TEE);  Surgeon: Lelon Perla, MD;  Location: [facility] ENDOSCOPY;  Service: Cardiovascular;  Laterality: N/A;    Current  Outpatient Prescriptions  Medication Sig Dispense Refill  . aspirin 81 MG tablet Take 1 tablet (81 mg total) by mouth every other day.    . cholecalciferol (VITAMIN D) 1000 UNITS tablet Take 1,000 Units by mouth daily.    . hydrOXYzine (VISTARIL) 25 MG capsule Take 1-2 capsules by mouth at bedtime.  3  . metoprolol tartrate (LOPRESSOR) 25 MG tablet Take 1 tablet (25 mg total) by mouth 2 (two) times daily as needed (Use as needed for rapid heart beat.). 60 tablet 3   No current facility-administered medications for this visit.     Allergies  Allergen Reactions  . Statins Other (See Comments)    Fuzzy in the head    Social History   Social History  . Marital status: Widowed    Spouse name: N/A  . Number of children: N/A  . Years of education: N/A   Occupational History  . Not on file.   Social History Main Topics  . Smoking status: Never Smoker  . Smokeless tobacco: Not on file  . Alcohol use Yes  . Drug use: Unknown  . Sexual activity: Not on file   Other Topics Concern  . Not on file   Social History Narrative  . No narrative on file    Family History  Problem Relation Age of Onset  . Heart attack Father   . Stroke Mother   . Kidney disease Mother   .  Heart disease Brother   . Hypertension Brother   . Heart disease Sister   . Heart disease Sister   . Stroke Paternal Uncle     several paternal uncles  . Stroke Paternal Aunt     Review of Systems:  As stated in the HPI and otherwise negative.   BP (!) 154/78   Pulse (!) 57   Ht 5\' 4"  (1.626 m)   Wt 94 lb 6.4 oz (42.8 kg)   BMI 16.20 kg/m   Physical Examination: General: Well developed, well nourished, NAD  HEENT: OP clear, mucus membranes moist  SKIN: warm, dry. No rashes. Neuro: No focal deficits  Musculoskeletal: Muscle strength 5/5 all ext  Psychiatric: Mood and affect normal  Neck: No JVD, no carotid bruits, no thyromegaly, no lymphadenopathy.  Lungs:Clear bilaterally, no wheezes, rhonci,  crackles Cardiovascular: Regular rate and rhythm. Subtle diastolic murmur. No gallops or rubs. Abdomen:Soft. Bowel sounds present. Non-tender.  Extremities: No lower extremity edema. Pulses are 2 + in the bilateral DP/PT.  Echo September 2016: Left ventricle: The cavity size was normal. Wall thickness was   normal. Systolic function was low normal to mildly reduced. The   estimated ejection fraction was in the range of 50% to 55%. Wall   motion was normal; there were no regional wall motion   abnormalities. Doppler parameters are consistent with abnormal   left ventricular relaxation (grade 1 diastolic dysfunction). - Aortic valve: There was moderate to severe regurgitation. There   was flow reversal in the doppler pattern in the descending   thoracic aorta, but not holodiastolic. - Mitral valve: Mildly calcified annulus. There was mild   regurgitation. - Right ventricle: The cavity size was normal. Systolic function   was normal. - Tricuspid valve: Peak RV-RA gradient (S): 24 mm Hg. - Pulmonary arteries: PA peak pressure: 27 mm Hg (S). - Inferior vena cava: The vessel was normal in size. The   respirophasic diameter changes were in the normal range (>= 50%),   consistent with normal central venous pressure.  Impressions:  - Normal LV size with low normal to mildly reduced systolic   function, EF 75-10%. Normal RV size and systolic function.   Trileaflet aortic valve with significant aortic regurgitation. In   the apical three chamber view, the aortic regurgitation appeared   moderate to severe. However, a short pressure halftime of the AR   signal was never obtained. There was diastolic flow reversal in   the descending thoracic aorta doppler signal but it did not   appear holodiastolic. Would consider TEE if patient is   symptomatic.  EKG:  EKG is ordered today. The ekg ordered today demonstrates sinus bradycardia, rate 57 bpm. LVH  Recent Labs: 12/27/2016: ALT 15; BUN 20;  Creatinine, Ser 0.73; Potassium 4.2; Sodium 140   Lipid Panel    Component Value Date/Time   CHOL 222 (H) 12/27/2016 0801   TRIG 93 12/27/2016 0801   HDL 88 12/27/2016 0801   CHOLHDL 2.5 12/27/2016 0801   CHOLHDL 2.3 12/30/2015 0741   VLDL 17 12/30/2015 0741   LDLCALC 115 (H) 12/27/2016 0801   LDLDIRECT 117.0 02/05/2014 0812     Wt Readings from Last 3 Encounters:  03/09/17 94 lb 6.4 oz (42.8 kg)  09/26/16 93 lb (42.2 kg)  08/23/16 91 lb 6.4 oz (41.5 kg)     Other studies Reviewed: Additional studies/ records that were reviewed today include: . Review of the above records demonstrates:   Assessment and Plan:  1. Tachycardia/Palpitations: She has palpitations at home. She has remote history of atrial tachycardia and PVCs. EKG today with sinus bradycardia. I will arrange a 48 hour cardiac monitor to exclude arrhythmias. Echo to assess LV systolic function  2. Dizziness: history of orthostatic hypotension. She has been well hydrated. Her dizziness occurs when lying in bed. This seems c/w vertigo.   3. Aortic insufficiency: Will arrange echo now to reassess.   Extensive record review. 45 minute encounter with patient.   Current medicines are reviewed at length with the patient today.  The patient does not have concerns regarding medicines.  The following changes have been made:  no change  Labs/ tests ordered today include:   Orders Placed This Encounter  Procedures  . Holter monitor - 48 hour  . EKG 12-Lead  . ECHOCARDIOGRAM COMPLETE    Disposition:   FU with Dr. Irish Lack in 3-4 weeks.   Signed, Lauree Chandler, MD 03/09/2017 3:16 PM    [facility] [place], [place],   [address] Phone: [phone]; Fax: [phone]

## 2017-03-09 NOTE — Patient Instructions (Signed)
Medication Instructions:  Your physician recommends that you continue on your current medications as directed. Please refer to the Current Medication list given to you today.   Labwork: none  Testing/Procedures: Your physician has requested that you have an echocardiogram. Echocardiography is a painless test that uses sound waves to create images of your heart. It provides your doctor with information about the size and shape of your heart and how well your heart's chambers and valves are working. This procedure takes approximately one hour. There are no restrictions for this procedure.  Your physician has recommended that you wear a holter monitor. Holter monitors are medical devices that record the heart's electrical activity. Doctors most often use these monitors to diagnose arrhythmias. Arrhythmias are problems with the speed or rhythm of the heartbeat. The monitor is a small, portable device. You can wear one while you do your normal daily activities. This is usually used to diagnose what is causing palpitations/syncope (passing out).    Follow-Up: Your physician recommends that you schedule a follow-up appointment in: 3-4 weeks (after echo and monitor) with Dr. Irish Lack or NP/PA    Any Other Special Instructions Will Be Listed Below (If Applicable).     If you need a refill on your cardiac medications before your next appointment, please call your pharmacy.

## 2017-03-23 ENCOUNTER — Other Ambulatory Visit: Payer: Self-pay

## 2017-03-23 ENCOUNTER — Ambulatory Visit (HOSPITAL_COMMUNITY): Payer: PPO | Attending: Cardiovascular Disease

## 2017-03-23 DIAGNOSIS — I361 Nonrheumatic tricuspid (valve) insufficiency: Secondary | ICD-10-CM | POA: Insufficient documentation

## 2017-03-23 DIAGNOSIS — I351 Nonrheumatic aortic (valve) insufficiency: Secondary | ICD-10-CM | POA: Diagnosis not present

## 2017-03-23 DIAGNOSIS — I119 Hypertensive heart disease without heart failure: Secondary | ICD-10-CM | POA: Diagnosis not present

## 2017-03-23 DIAGNOSIS — E7800 Pure hypercholesterolemia, unspecified: Secondary | ICD-10-CM | POA: Insufficient documentation

## 2017-03-23 DIAGNOSIS — I359 Nonrheumatic aortic valve disorder, unspecified: Secondary | ICD-10-CM | POA: Diagnosis not present

## 2017-04-06 NOTE — Progress Notes (Deleted)
Cardiology Office Note   Date:  04/06/2017   ID:  Samantha Gregory May 13, 1935, MRN [id number]  PCP:  No PCP Per Patient    No chief complaint on file.    Wt Readings from Last 3 Encounters:  03/09/17 94 lb 6.4 oz (42.8 kg)  09/26/16 93 lb (42.2 kg)  08/23/16 91 lb 6.4 oz (41.5 kg)       History of Present Illness: Samantha Gregory is a 82 y.o. female  Who has a history of orthostatic hypotension. This has improved with liberalization of salt and fluid intake. She saw Dr. Mare Ferrari in the past.   In 2001 she had a questionable TIA and had an echocardiogram with bubble study which did not show any embolic source. She has had previous carotid Dopplers which are normal. The patient is no longer on statins. She has tried most of the statins. She had to go off the statins because it was causing her to have decreased memory. Fortunately her HDL level is satisfactory and her risk ratio is not excessive.  She has a history of occasional PVCs. She drinks coffee in the morning but not the rest of the day.     She was recently seen complaining of shortness of breath.  Echo in 9/16 showed:  Normal LV size with low normal to mildly reduced systolic function, EF 14-97%. Normal RV size and systolic function. Trileaflet aortic valve with significant aortic regurgitation. In the apical three chamber view, the aortic regurgitation appeared moderate to severe. However, a short pressure halftime of the AR signal was never obtained. There was diastolic flow reversal in the descending thoracic aorta doppler signal but it did not appear holodiastolic. Would consider TEE if patient is symptomatic.  She has not been walking.  She was intolerant of losartan and seen in the PharmD HTN clinic.  Rash did not improve off of losartan.  BP was at goal off of losartan.  She has noticed more prominent veins in the legs.  She has noticed some ankle swelling.  She has  an appt with [facility] for her venous insufficiency.  She is active working in her yard as well.   In 2017, she had been under a lot of stress. 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 She has tried to get the patient into one of the veterans hospitals with no success. Samantha Gregory was still going over to check on him and help with nursing care 2 times a day and is wearing herself out.    In late 2017, [facility].  She was visiting him daily initially.  She did this until he got settled.  Then she will go less.  Her stress has decreased.  He wants to marry her. She is considering this as there are pension issues as well. They have been friends for many years.  He is in hospice and will likely not return home. This has been a source of stress for her.    Past Medical History:  Diagnosis Date  . Essential hypertension   . Heart palpitations   . Hypercholesterolemia   . Mitral regurgitation   . Orthostatic hypotension   . Paroxysmal atrial tachycardia ([place])   . PVC's (premature ventricular contractions)     Past Surgical History:  Procedure Laterality Date  . APPENDECTOMY    . TEE WITHOUT CARDIOVERSION N/A 09/08/2015   Procedure: TRANSESOPHAGEAL ECHOCARDIOGRAM (TEE);  Surgeon:  Lelon Perla, MD;  Location: [facility] ENDOSCOPY;  Service: Cardiovascular;  Laterality: N/A;     Current Outpatient Prescriptions  Medication Sig Dispense Refill  . aspirin 81 MG tablet Take 1 tablet (81 mg total) by mouth every other day.    . cholecalciferol (VITAMIN D) 1000 UNITS tablet Take 1,000 Units by mouth daily.    . hydrOXYzine (VISTARIL) 25 MG capsule Take 1-2 capsules by mouth at bedtime.  3  . metoprolol tartrate (LOPRESSOR) 25 MG tablet Take 1 tablet (25 mg total) by mouth 2 (two) times daily as needed (Use as needed for rapid heart beat.). 60 tablet 3   No current facility-administered medications for this visit.     Allergies:   Statins     Social History:  The patient  reports that she has never smoked. She does not have any smokeless tobacco history on file. She reports that she drinks alcohol.   Family History:  The patient's family history includes Heart attack in her father; Heart disease in her brother, sister, and sister; Hypertension in her brother; Kidney disease in her mother; Stroke in her mother, paternal aunt, and paternal uncle.    ROS:  Please see the history of present illness.   Otherwise, review of systems are positive for itching legs, tachycardia, stress 6.   All other systems are reviewed and negative.    PHYSICAL EXAM: VS:  There were no vitals taken for this visit. , BMI There is no height or weight on file to calculate BMI. GEN: Well nourished, well developed, in no acute distress  HEENT: normal  Neck: no JVD, carotid bruits, or masses Cardiac: RRR; no murmurs, rubs, or gallops,no edema  Respiratory:  clear to auscultation bilaterally, normal work of breathing GI: soft, nontender, nondistended, + BS MS: no deformity or atrophy  Skin: warm and dry, no rash Neuro:  Strength and sensation are intact Psych: euthymic mood, full affect   EKG:   The ekg ordered in 8/17 demonstrates NSR, LVH   Recent Labs: 12/27/2016: ALT 15; BUN 20; Creatinine, Ser 0.73; Potassium 4.2; Sodium 140   Lipid Panel    Component Value Date/Time   CHOL 222 (H) 12/27/2016 0801   TRIG 93 12/27/2016 0801   HDL 88 12/27/2016 0801   CHOLHDL 2.5 12/27/2016 0801   CHOLHDL 2.3 12/30/2015 0741   VLDL 17 12/30/2015 0741   LDLCALC 115 (H) 12/27/2016 0801   LDLDIRECT 117.0 02/05/2014 6962     Other studies Reviewed: Additional studies/ records that were reviewed today with results demonstrating: echo results.   ASSESSMENT AND PLAN:  1. Orthostatic Hypotension: Improved with increased salt and water intake.  Would not decrease salt intake since these sx are controlled.  She is active looking after an elderly friend.  He  wants to marry her.  He is on hospice. BP reasonably controlled.   2. Aortic insufficiency: no murmur on exam.  No CHF sx. No need for further testing at this time. 3. HTN: Off of losartan now due to itchy legs and rash; did not change with stopping of the medicine. BP reasonably controlled.  If needed, she would be willing to try losartan again.  WIll hold off at this time.   4. PVC: COntinue beta blocker.   THis also helps her tachycardia sx. 5. Will refer to a PMD. 6. Check labs in Jan 2018.   Current medicines are reviewed at length with the patient today.  The patient concerns regarding her medicines were addressed.  The following changes have been made:  No change  Labs/ tests ordered today include: No orders of the defined types were placed in this encounter.   Recommend 150 minutes/week of aerobic exercise Low fat, low carb, high fiber diet recommended  Disposition:   FU in 9 months   Signed, Larae Grooms, MD  04/06/2017 8:36 AM    [facility] [place], [place], [place]  [address] Phone: [phone]; Fax: [phone]

## 2017-04-07 ENCOUNTER — Ambulatory Visit: Payer: PPO | Admitting: Interventional Cardiology

## 2017-04-11 ENCOUNTER — Encounter: Payer: Self-pay | Admitting: Interventional Cardiology

## 2017-05-17 NOTE — Progress Notes (Signed)
Cardiology Office Note   Date:  05/18/2017   ID:  Samantha, Lady Jul Gregory, 1936, MRN [id number]  PCP:  Patient, No Pcp Per    No chief complaint on file. back pain   Wt Readings from Last 3 Encounters:  05/18/17 88 lb 1.9 oz (40 kg)  03/09/17 94 lb 6.4 oz (42.8 kg)  10/Gregory/17 93 lb (42.2 kg)       History of Present Illness: Samantha Gregory is a 82 y.o. female  Who has a history of orthostatic hypotension. This has improved with liberalization of salt and fluid intake. She saw Dr. Mare Ferrari in the past.   In 2001 she had a questionable TIA and had an echocardiogram with bubble study which did not show any embolic source. She has had previous carotid Dopplers which are normal. The patient is no longer on statins. She has tried most of the statins. She had to go off the statins because it was causing her to have decreased memory. Fortunately her HDL level is satisfactory and her risk ratio is not excessive.  She has a history of occasional PVCs. She drinks coffee in the morning but not the rest of the day.  She was intolerant of losartan and seen in the PharmD HTN clinic.  Rash did not improve off of losartan.  BP was at goal off of losartan.   Echo in 9/16 showed:  Normal LV size with low normal to mildly reduced systolic function, EF 10-30%. Normal RV size and systolic function. Trileaflet aortic valve with significant aortic regurgitation. In the apical three chamber view, the aortic regurgitation appeared moderate to severe. However, a short pressure halftime of the AR signal was never obtained. There was diastolic flow reversal in the descending thoracic aorta doppler signal but it did not appear holodiastolic. Would consider TEE if patient is symptomatic.  No change in LV function noted in 3/18 echo.   She is active working in her yard as well. She feel on her back and has had low back pain since that time.  She wants an xray.  She  still does not have a PMD.   She had stress from taking care of an elderly gentleman. He passed away recently.  She did not come back for her Holter monitor as instructed in 3/18.    Palpitations have resolved.  She only has it wen she is very stressed.  She can take prn lopressor with relief.  She does not want to do the Holter monitor.     Past Medical History:  Diagnosis Date  . Essential hypertension   . Heart palpitations   . Hypercholesterolemia   . Mitral regurgitation   . Orthostatic hypotension   . Paroxysmal atrial tachycardia ([place])   . PVC's (premature ventricular contractions)     Past Surgical History:  Procedure Laterality Date  . APPENDECTOMY    . TEE WITHOUT CARDIOVERSION N/A 09/08/2015   Procedure: TRANSESOPHAGEAL ECHOCARDIOGRAM (TEE);  Surgeon: Lelon Perla, MD;  Location: [facility] ENDOSCOPY;  Service: Cardiovascular;  Laterality: N/A;     Current Outpatient Prescriptions  Medication Sig Dispense Refill  . aspirin 81 MG tablet Take 1 tablet (81 mg total) by mouth every other day. (Patient taking differently: Take 81 mg by mouth every other day. Patient states she only takes 1 a week)    . cholecalciferol (VITAMIN D) 1000 UNITS tablet Take 1,000 Units by mouth daily.    . metoprolol tartrate (LOPRESSOR) 25 MG tablet Take 1  tablet (25 mg total) by mouth 2 (two) times daily as needed (Use as needed for rapid heart beat.). 60 tablet 3   No current facility-administered medications for this visit.     Allergies:   Statins    Social History:  The patient  reports that she has never smoked. She has never used smokeless tobacco. She reports that she drinks alcohol.   Family History:  The patient's family history includes Heart attack in her father; Heart disease in her brother, sister, and sister; Hypertension in her brother; Kidney disease in her mother; Stroke in her mother, paternal aunt, and paternal uncle.    ROS:  Please see the history of present illness.    Otherwise, review of systems are positive for itching legs, tachycardia, stress 6.   All other systems are reviewed and negative.    PHYSICAL EXAM: VS:  BP 126/74 (BP Location: Left Arm, Patient Position: Sitting, Cuff Size: Normal)   Pulse 70   Ht 5\' 4"  (1.626 m)   Wt 88 lb 1.9 oz (40 kg)   SpO2 99%   BMI 15.13 kg/m  , BMI Body mass index is 15.13 kg/m. GEN: Well nourished, well developed, in no acute distress  HEENT: normal  Neck: no JVD, carotid bruits, or masses Cardiac: RRR; no murmurs, rubs, or gallops,no edema  Respiratory:  clear to auscultation bilaterally, normal work of breathing GI: soft, nontender, nondistended,  MS: no deformity or atrophy  Skin: warm and dry, no rash; varicose veins on both legs Neuro:  Strength and sensation are intact Psych: euthymic mood, full affect   EKG:   The ekg ordered in 8/17 demonstrates NSR, LVH   Recent Labs: 12/27/2016: ALT 15; BUN 20; Creatinine, Ser 0.73; Potassium 4.2; Sodium 140   Lipid Panel    Component Value Date/Time   CHOL 222 (H) 01/Gregory/2018 0801   TRIG 93 01/Gregory/2018 0801   HDL 88 01/Gregory/2018 0801   CHOLHDL 2.5 01/Gregory/2018 0801   CHOLHDL 2.3 12/30/2015 0741   VLDL 17 12/30/2015 0741   LDLCALC 115 (H) 01/Gregory/2018 0801   LDLDIRECT 117.0 Gregory/10/2014 3704     Other studies Reviewed: Additional studies/ records that were reviewed today with results demonstrating: 2018 echo results.   ASSESSMENT AND PLAN:  1. Orthostatic Hypotension: COntinue with liberal salt and water intake.  BP controlled.   2. Aortic insufficiency: moderate by echo.  Normal LV function by 2018 echo. 3. HTN: Off of losartan now due to itchy legs and rash; did not change with stopping of the medicine. BP reasonably controlled.  If needed, she would be willing to try losartan again.  No need fo rlosartan at this time.    4. PVC/tachypalpitations: sx controlled with a beta blocker 5. Will refer again to a PMD.  She has not gone with the prior referrals.    6. Back pain: Will send for low back xray.  I don't think she has a hip fracture.  She can bend her legs easily.  It is persistent low back pain.  SHe could have a compression fracture.     Current medicines are reviewed at length with the patient today.  The patient concerns regarding her medicines were addressed.  The following changes have been made:  No change  Labs/ tests ordered today include: No orders of the defined types were placed in this encounter.   Recommend 150 minutes/week of aerobic exercise Low fat, low carb, high fiber diet recommended  Disposition:   FU in 6  months   Signed, Larae Grooms, MD  05/18/2017 2:40 PM    [facility] [address], [place], [place]  [address] Phone: [phone]; Fax: [phone]

## 2017-05-18 ENCOUNTER — Ambulatory Visit (INDEPENDENT_AMBULATORY_CARE_PROVIDER_SITE_OTHER): Payer: PPO | Admitting: Interventional Cardiology

## 2017-05-18 ENCOUNTER — Ambulatory Visit
Admission: RE | Admit: 2017-05-18 | Discharge: 2017-05-18 | Disposition: A | Discharge status: Home / Self Care | Payer: PPO | Source: Ambulatory Visit | Attending: Interventional Cardiology | Admitting: Interventional Cardiology

## 2017-05-18 ENCOUNTER — Encounter: Payer: Self-pay | Admitting: Interventional Cardiology

## 2017-05-18 ENCOUNTER — Encounter (INDEPENDENT_AMBULATORY_CARE_PROVIDER_SITE_OTHER): Payer: Self-pay

## 2017-05-18 VITALS — BP 126/74 | HR 70 | Ht 64.0 in | Wt 88.1 lb

## 2017-05-18 DIAGNOSIS — M549 Dorsalgia, unspecified: Secondary | ICD-10-CM | POA: Diagnosis not present

## 2017-05-18 DIAGNOSIS — I351 Nonrheumatic aortic (valve) insufficiency: Secondary | ICD-10-CM | POA: Diagnosis not present

## 2017-05-18 DIAGNOSIS — M545 Low back pain, unspecified: Secondary | ICD-10-CM

## 2017-05-18 DIAGNOSIS — I951 Orthostatic hypotension: Secondary | ICD-10-CM | POA: Diagnosis not present

## 2017-05-18 DIAGNOSIS — I493 Ventricular premature depolarization: Secondary | ICD-10-CM

## 2017-05-18 DIAGNOSIS — S3992XA Unspecified injury of lower back, initial encounter: Secondary | ICD-10-CM | POA: Diagnosis not present

## 2017-05-18 NOTE — Patient Instructions (Signed)
Medication Instructions:  Your physician recommends that you continue on your current medications as directed. Please refer to the Current Medication list given to you today.   Labwork: None ordered  Testing/Procedures: Your physician wants you to have a lower back Xray for your back pain since your fall.  Follow-Up: Your physician wants to be seen by a primary care provider. Information given.  Your physician wants you to follow-up in: 6 months with Dr. Irish Lack. You will receive a reminder letter in the mail two months in advance. If you don't receive a letter, please call our office to schedule the follow-up appointment.   Any Other Special Instructions Will Be Listed Below (If Applicable).     If you need a refill on your cardiac medications before your next appointment, please call your pharmacy.

## 2017-05-29 ENCOUNTER — Ambulatory Visit: Payer: PPO | Admitting: Family Medicine

## 2017-05-31 ENCOUNTER — Ambulatory Visit (INDEPENDENT_AMBULATORY_CARE_PROVIDER_SITE_OTHER): Payer: PPO

## 2017-05-31 ENCOUNTER — Ambulatory Visit (INDEPENDENT_AMBULATORY_CARE_PROVIDER_SITE_OTHER): Payer: PPO | Admitting: Family Medicine

## 2017-05-31 ENCOUNTER — Encounter: Payer: Self-pay | Admitting: Family Medicine

## 2017-05-31 VITALS — BP 116/74 | HR 58 | Temp 97.9°F | Ht 64.0 in | Wt 92.0 lb

## 2017-05-31 DIAGNOSIS — M25552 Pain in left hip: Secondary | ICD-10-CM

## 2017-05-31 DIAGNOSIS — R002 Palpitations: Secondary | ICD-10-CM

## 2017-05-31 DIAGNOSIS — M810 Age-related osteoporosis without current pathological fracture: Secondary | ICD-10-CM | POA: Diagnosis not present

## 2017-05-31 DIAGNOSIS — S32000A Wedge compression fracture of unspecified lumbar vertebra, initial encounter for closed fracture: Secondary | ICD-10-CM | POA: Diagnosis not present

## 2017-05-31 DIAGNOSIS — M545 Low back pain, unspecified: Secondary | ICD-10-CM

## 2017-05-31 DIAGNOSIS — M1612 Unilateral primary osteoarthritis, left hip: Secondary | ICD-10-CM | POA: Diagnosis not present

## 2017-05-31 MED ORDER — CALCITONIN (SALMON) 200 UNIT/ACT NA SOLN: 1.0000 | Freq: Every day | NASAL | 2 refills | Status: DC

## 2017-05-31 MED ORDER — TRAMADOL HCL 50 MG PO TABS: 50.0000 mg | ORAL_TABLET | Freq: Three times a day (TID) | ORAL | 0 refills | Status: DC | PRN

## 2017-05-31 NOTE — Progress Notes (Signed)
Samantha Gregory is a 82 y.o. female is here to [place].   Patient Care Team: Briscoe Deutscher, DO as PCP - General (Family Medicine)   History of Present Illness:   Shaune Pascal CMA acting as scribe for Dr. Juleen China.  HPI:   Back Pain: Lumbar with radiation to left hip. Recent lumbar xray showing multiple compression fractures. She remembers feeling an acute pain in her low back a few weeks ago while lifting a lever on her lawn mower. Her husband died one month ago, and she was his caregiver prior to that. She like to keep her yard looking nice. Hx of osteoporosis, never improved with medications.   Palpitations and SOB: NONE NOW. Hx of the same. Followed by Cardiology. Hx of aortic insufficiency, palpitations. She has been okayed to take prn lopressor. She has been taking 2-3 per day over the past few days. She was supposed to have a Holter monitor placed, but did not do it due to "too much going on." She is willing to do it now.   Health Maintenance Due  Topic Date Due  . TETANUS/TDAP  12/[birth year]/1956  . PNA vac Low Risk Adult (1 of 2 - PCV13) 12/24/2002   PMHx, SurgHx, SocialHx, Medications, and Allergies were reviewed in the Visit Navigator and updated as appropriate.   Past Medical History:  Diagnosis Date  . Essential hypertension   . Heart palpitations   . Hypercholesterolemia   . Mitral regurgitation   . Orthostatic hypotension   . Paroxysmal atrial tachycardia ([place])   . PVC's (premature ventricular contractions)    Past Surgical History:  Procedure Laterality Date  . APPENDECTOMY    . TEE WITHOUT CARDIOVERSION N/A 09/08/2015   Procedure: TRANSESOPHAGEAL ECHOCARDIOGRAM (TEE);  Surgeon: Lelon Perla, MD;  Location: [facility] ENDOSCOPY;  Service: Cardiovascular;  Laterality: N/A;   Family History  Problem Relation Age of Onset  . Heart attack Father   . Stroke Mother   . Kidney disease Mother   . Heart disease Brother   . Hypertension Brother   . Heart disease  Sister   . Heart disease Sister   . Stroke Paternal Uncle   . Stroke Paternal Aunt    Social History  Substance Use Topics  . Smoking status: Never Smoker  . Smokeless tobacco: Never Used  . Alcohol use Yes   Current Medications and Allergies:   .  aspirin 81 MG tablet, Take 1 tablet (81 mg total) by mouth every other day. (Patient taking differently: Take 81 mg by mouth every other day. Patient states she only takes 1 a week), Disp: , Rfl:  .  cholecalciferol (VITAMIN D) 1000 UNITS tablet, Take 1,000 Units by mouth daily., Disp: , Rfl:  .  metoprolol tartrate (LOPRESSOR) 25 MG tablet, Take 1 tablet (25 mg total) by mouth 2 (two) times daily as needed (Use as needed for rapid heart beat.)., Disp: 60 tablet, Rfl: 3  Allergies  Allergen Reactions  . Statins Other (See Comments)    Fuzzy in the head   Review of Systems:   Review of Systems  Constitutional: Positive for malaise/fatigue. Negative for chills and fever.  HENT: Negative for ear pain, sinus pain and sore throat.   Eyes: Negative for blurred vision and double vision.  Respiratory: Positive for shortness of breath. Negative for cough and wheezing.   Cardiovascular: Negative for palpitations and leg swelling.  Gastrointestinal: Negative for abdominal pain, nausea and vomiting.  Genitourinary:  Chronic.   Musculoskeletal: Positive for back pain and neck pain. Negative for joint pain.  Neurological: Positive for dizziness and headaches.  Psychiatric/Behavioral: Negative for depression, hallucinations and memory loss.   Vitals:   Vitals:   05/31/17 1304  BP: 116/74  Pulse: (!) 58  Temp: 97.9 F (36.6 C)  TempSrc: Oral  SpO2: 99%  Weight: 92 lb (41.7 kg)  Height: 5\' 4"  (1.626 m)     Body mass index is 15.79 kg/m.  Physical Exam:   Physical Exam  Constitutional: She appears well-developed and well-nourished. No distress.  HENT:  Head: Normocephalic and atraumatic.  Eyes: EOM are normal. Pupils are  equal, round, and reactive to light.  Neck: Normal range of motion. Neck supple.  Cardiovascular: Normal rate, regular rhythm, normal heart sounds and intact distal pulses.   Pulmonary/Chest: Effort normal.  Abdominal: Soft.  Musculoskeletal:       Lumbar back: She exhibits decreased range of motion and bony tenderness.  Skin: Skin is warm.  Psychiatric: She has a normal mood and affect. Her behavior is normal.  Nursing note and vitals reviewed.   EXAM: LUMBAR SPINE - 2-3 VIEW  COMPARISON:  None.  FINDINGS: For counting purposes, the last open disc space is labeled the L5-S1 level. There is an approximately 30% L4 vertebral compression deformity, 25% L3 vertebral compression deformity, 10% L2 vertebral compression deformity, 15% L1 vertebral compression deformity and 50% T12 vertebral compression deformity. No acute fracture lines are visualized. No visible bony retropulsion. No subluxations. Facet degenerative changes in the mid and lower lumbar spine.  IMPRESSION: Multiple age determine vertebral compression deformities. Facet degenerative changes in the mid and lower lumbar spine.  Electronically Signed   By: Claudie Revering M.D.   On: 05/18/2017 19:30  Assessment and Plan:   Lilyannah was seen today for establish care.  Diagnoses and all orders for this visit:  Fracture of lumbar vertebra, compression, closed, initial encounter [id number]) Comments: Pain control with scheduled Tylenol, prn Tramadol. Calcitonin x 2 months. PT. If not with adequate improvement in pain, to IR.  Orders: -     traMADol (ULTRAM) 50 MG tablet; Take 1 tablet (50 mg total) by mouth every 8 (eight) hours as needed. -     calcitonin, salmon, (MIACALCIN) 200 UNIT/ACT nasal spray; Place 1 spray into alternate nostrils daily. -     Ambulatory referral to Physical Therapy  Left hip pain Comments: Xray of hips showing arthritis only.  Orders: -     DG HIP UNILAT W OR W/O PELVIS 2-3 VIEWS LEFT;  Future  Lumbar pain Comments: Xray from 05/18/17 reviewed. This was discussed with the patient at length. Plan above.   Osteoporosis, unspecified osteoporosis type, unspecified pathological fracture presence Comments: The patient has been on "all medications except for Reclast and nothing ever worked." She declines treatment. I rec. calcium + D.   Palpitations Comments: Will contact Cardiology and let them know that she has been more symptomatic and willing to do Holter.     . Reviewed expectations re: course of current medical issues. . Discussed self-management of symptoms. . Outlined signs and symptoms indicating need for more acute intervention. . Patient verbalized understanding and all questions were answered. Marland Kitchen Health Maintenance issues including appropriate healthy diet, exercise, and smoking avoidance were discussed with patient. . See orders for this visit as documented in the electronic medical record. . Patient received an After Visit Summary.  CMA served as Education administrator during this visit. History, Physical, and Plan  performed by medical provider. The above documentation has been reviewed and is accurate and complete. Briscoe Deutscher, D.O.  Briscoe Deutscher, DO [place], [place] 06/03/2017  No future appointments.

## 2017-06-03 DIAGNOSIS — N882 Stricture and stenosis of cervix uteri: Secondary | ICD-10-CM | POA: Insufficient documentation

## 2017-06-03 DIAGNOSIS — R000 Tachycardia, unspecified: Secondary | ICD-10-CM

## 2017-06-03 DIAGNOSIS — R87619 Unspecified abnormal cytological findings in specimens from cervix uteri: Secondary | ICD-10-CM

## 2017-06-03 DIAGNOSIS — S32050A Wedge compression fracture of fifth lumbar vertebra, initial encounter for closed fracture: Secondary | ICD-10-CM | POA: Insufficient documentation

## 2017-06-03 DIAGNOSIS — S32000A Wedge compression fracture of unspecified lumbar vertebra, initial encounter for closed fracture: Secondary | ICD-10-CM | POA: Insufficient documentation

## 2017-06-03 HISTORY — DX: Tachycardia, unspecified: R00.0

## 2017-06-03 HISTORY — DX: Wedge compression fracture of fifth lumbar vertebra, initial encounter for closed fracture: S32.050A

## 2017-06-03 HISTORY — DX: Unspecified abnormal cytological findings in specimens from cervix uteri: R87.619

## 2017-06-03 HISTORY — DX: Stricture and stenosis of cervix uteri: N88.2

## 2017-06-13 ENCOUNTER — Telehealth: Payer: Self-pay | Admitting: Physical Therapy

## 2017-06-13 NOTE — Telephone Encounter (Signed)
Spoke with patient on 06/05/17, unaware of PT referral. Patient said she would call MD office

## 2017-06-16 DIAGNOSIS — C44629 Squamous cell carcinoma of skin of left upper limb, including shoulder: Secondary | ICD-10-CM | POA: Diagnosis not present

## 2017-06-16 DIAGNOSIS — L570 Actinic keratosis: Secondary | ICD-10-CM | POA: Diagnosis not present

## 2017-06-16 DIAGNOSIS — L308 Other specified dermatitis: Secondary | ICD-10-CM | POA: Diagnosis not present

## 2017-06-16 DIAGNOSIS — X32XXXA Exposure to sunlight, initial encounter: Secondary | ICD-10-CM | POA: Diagnosis not present

## 2017-06-20 ENCOUNTER — Telehealth: Payer: Self-pay | Admitting: Family Medicine

## 2017-06-20 ENCOUNTER — Ambulatory Visit: Payer: PPO | Admitting: Interventional Cardiology

## 2017-06-20 ENCOUNTER — Ambulatory Visit (INDEPENDENT_AMBULATORY_CARE_PROVIDER_SITE_OTHER): Payer: PPO

## 2017-06-20 ENCOUNTER — Ambulatory Visit (INDEPENDENT_AMBULATORY_CARE_PROVIDER_SITE_OTHER): Payer: PPO | Admitting: Family Medicine

## 2017-06-20 ENCOUNTER — Encounter: Payer: Self-pay | Admitting: Family Medicine

## 2017-06-20 VITALS — BP 122/74 | HR 64 | Temp 98.1°F | Ht 64.0 in | Wt 93.2 lb

## 2017-06-20 DIAGNOSIS — M25572 Pain in left ankle and joints of left foot: Secondary | ICD-10-CM | POA: Diagnosis not present

## 2017-06-20 DIAGNOSIS — M25472 Effusion, left ankle: Secondary | ICD-10-CM | POA: Diagnosis not present

## 2017-06-20 DIAGNOSIS — M7989 Other specified soft tissue disorders: Secondary | ICD-10-CM | POA: Diagnosis not present

## 2017-06-20 MED ORDER — DICLOFENAC SODIUM 2 % TD SOLN: 1.0000 "application " | Freq: Two times a day (BID) | TRANSDERMAL | 0 refills | Status: DC | PRN

## 2017-06-20 NOTE — Telephone Encounter (Signed)
Painful swollen left ankle, appt made at 11:15 w/ Juleen China 06/20/2017

## 2017-06-20 NOTE — Progress Notes (Signed)
Samantha Gregory is a 82 y.o. female here for an acute visit.  History of Present Illness:   Water quality scientist, CMA, acting as scribe for Dr. Juleen China.  Ankle Pain   The incident occurred 5 to 7 days ago. There was no injury mechanism. The pain is present in the left ankle. The quality of the pain is described as aching. The pain is at a severity of 6/10. The pain is moderate. The pain has been constant since onset. She reports no foreign bodies present. The symptoms are aggravated by movement and weight bearing. She has tried elevation for the symptoms. The treatment provided mild relief.   PMHx, SurgHx, SocialHx, Medications, and Allergies were reviewed in the Visit Navigator and updated as appropriate.  Current Medications:    .  aspirin 81 MG tablet, Take 1 tablet (81 mg total) by mouth every other day. (Patient taking differently: Take 81 mg by mouth every other day. Patient states she only takes 1 a week), Disp: , Rfl:  .  cholecalciferol (VITAMIN D) 1000 UNITS tablet, Take 1,000 Units by mouth daily., Disp: , Rfl:  .  metoprolol tartrate (LOPRESSOR) 25 MG tablet, Take 1 tablet (25 mg total) by mouth 2 (two) times daily as needed (Use as needed for rapid heart beat.)., Disp: 60 tablet, Rfl: 3  Allergies  Allergen Reactions  . Statins Anxiety   Review of Systems:   Review of Systems  All other systems reviewed and are negative.  Vitals:   Vitals:   06/20/17 1125  BP: 122/74  Pulse: 64  Temp: 98.1 F (36.7 C)  TempSrc: Oral  SpO2: 98%  Weight: 93 lb 3.2 oz (42.3 kg)  Height: 5\' 4"  (1.626 m)     Body mass index is 16 kg/m.  Physical Exam:   Physical Exam  Constitutional: She appears well-nourished.  HENT:  Head: Normocephalic and atraumatic.  Eyes: EOM are normal. Pupils are equal, round, and reactive to light.  Neck: Normal range of motion. Neck supple.  Cardiovascular: Normal rate, regular rhythm, normal heart sounds and intact distal pulses.     Pulmonary/Chest: Effort normal.  Abdominal: Soft.  Musculoskeletal:       Left ankle: She exhibits swelling. She exhibits no ecchymosis, no deformity and normal pulse. Tenderness. Lateral malleolus and medial malleolus tenderness found. Achilles tendon exhibits pain.  Skin: Skin is warm.  Psychiatric: She has a normal mood and affect. Her behavior is normal.  Nursing note and vitals reviewed.  EXAM: LEFT ANKLE - 2 VIEW  FINDINGS: Diffuse soft tissue swelling. No underlying acute bony abnormality. No fracture, subluxation or dislocation.  IMPRESSION: No acute bony abnormality.  LLE US  IMPRESSION: No evidence of DVT within the left lower extremity.  Small left popliteal cyst.  Assessment and Plan:   Samantha Gregory was seen today for acute visit and ankle pain.  Diagnoses and all orders for this visit:  Acute left ankle pain Comments: DDx: gout v pseudogout v overuse of OA joint. Patient does not tolerated steroids. Okay NSAIDs. Okay to wrap as well. If not improving, to Sports Medicine. Orders: -     DG Ankle 2 Views Left; Future -     US Venous Img Lower Unilateral Left; Future -     Diclofenac Sodium (PENNSAID) 2 % SOLN; Place 1 application onto the skin 2 (two) times daily as needed.  Left ankle swelling -     US Venous Img Lower Unilateral Left; Future -     Diclofenac  Sodium (PENNSAID) 2 % SOLN; Place 1 application onto the skin 2 (two) times daily as needed.    . Reviewed expectations re: course of current medical issues. . Discussed self-management of symptoms. . Outlined signs and symptoms indicating need for more acute intervention. . Patient verbalized understanding and all questions were answered. Marland Kitchen Health Maintenance issues including appropriate healthy diet, exercise, and smoking avoidance were discussed with patient. . See orders for this visit as documented in the electronic medical record. . Patient received an After Visit Summary.  CMA served as Education administrator  during this visit. History, Physical, and Plan performed by medical provider. The above documentation has been reviewed and is accurate and complete. Briscoe Deutscher, D.O.  Briscoe Deutscher, DO [place], [place] [facility] 06/24/2017

## 2017-06-20 NOTE — Patient Instructions (Signed)
Try Aleve for pain and swelling.

## 2017-06-20 NOTE — Telephone Encounter (Signed)
Noted  

## 2017-06-21 ENCOUNTER — Ambulatory Visit
Admission: RE | Admit: 2017-06-21 | Discharge: 2017-06-21 | Disposition: A | Discharge status: Home / Self Care | Payer: PPO | Source: Ambulatory Visit | Attending: Family Medicine | Admitting: Family Medicine

## 2017-06-21 DIAGNOSIS — M25572 Pain in left ankle and joints of left foot: Secondary | ICD-10-CM

## 2017-06-21 DIAGNOSIS — M71372 Other bursal cyst, left ankle and foot: Secondary | ICD-10-CM | POA: Diagnosis not present

## 2017-06-21 DIAGNOSIS — M25472 Effusion, left ankle: Secondary | ICD-10-CM

## 2017-06-21 DIAGNOSIS — M7989 Other specified soft tissue disorders: Secondary | ICD-10-CM | POA: Diagnosis not present

## 2017-06-30 ENCOUNTER — Ambulatory Visit: Payer: PPO | Admitting: Sports Medicine

## 2017-07-03 ENCOUNTER — Ambulatory Visit: Payer: PPO | Admitting: Sports Medicine

## 2017-07-03 DIAGNOSIS — Z0289 Encounter for other administrative examinations: Secondary | ICD-10-CM

## 2017-07-10 DIAGNOSIS — H01004 Unspecified blepharitis left upper eyelid: Secondary | ICD-10-CM | POA: Diagnosis not present

## 2017-07-10 DIAGNOSIS — H01001 Unspecified blepharitis right upper eyelid: Secondary | ICD-10-CM | POA: Diagnosis not present

## 2017-07-10 DIAGNOSIS — H11822 Conjunctivochalasis, left eye: Secondary | ICD-10-CM | POA: Diagnosis not present

## 2017-07-17 ENCOUNTER — Telehealth: Payer: Self-pay | Admitting: Interventional Cardiology

## 2017-07-17 DIAGNOSIS — S93402A Sprain of unspecified ligament of left ankle, initial encounter: Secondary | ICD-10-CM | POA: Diagnosis not present

## 2017-07-17 NOTE — Telephone Encounter (Signed)
Patient calling, states that she had a few accidents this year. Patient states that on 03-24-17 she fractured her ankle and has been in pain ever since that day. Patient states that Dr. Irish Lack had recommended a PCP for patient but PCP was not accepting new patients. Patient would like a recommendation for a PCP so she can have an xray done.

## 2017-07-17 NOTE — Telephone Encounter (Signed)
Spoke with the pt and she is needing another reference for a new PCP in the area.  Pt states Dr Irish Lack advised her to get in with Dr Birdie Riddle, but she is not accepting any new pts at this time.  Provided the pt with our [facility] number to help and aid her in finding a PCP within the area/network.  Pt verbalized understanding and gracious for all the assistance provided.

## 2017-07-21 ENCOUNTER — Other Ambulatory Visit: Payer: Self-pay | Admitting: Interventional Cardiology

## 2017-07-21 DIAGNOSIS — R002 Palpitations: Secondary | ICD-10-CM

## 2017-07-21 MED ORDER — METOPROLOL TARTRATE 25 MG PO TABS: 25.0000 mg | ORAL_TABLET | Freq: Two times a day (BID) | ORAL | 9 refills | Status: DC | PRN

## 2017-07-25 ENCOUNTER — Ambulatory Visit: Payer: PPO | Attending: Family Medicine | Admitting: Physical Therapy

## 2017-07-25 ENCOUNTER — Encounter: Payer: Self-pay | Admitting: Physical Therapy

## 2017-07-25 DIAGNOSIS — M25572 Pain in left ankle and joints of left foot: Secondary | ICD-10-CM | POA: Diagnosis not present

## 2017-07-25 DIAGNOSIS — R262 Difficulty in walking, not elsewhere classified: Secondary | ICD-10-CM

## 2017-07-25 DIAGNOSIS — R2242 Localized swelling, mass and lump, left lower limb: Secondary | ICD-10-CM | POA: Insufficient documentation

## 2017-07-25 DIAGNOSIS — M25672 Stiffness of left ankle, not elsewhere classified: Secondary | ICD-10-CM | POA: Insufficient documentation

## 2017-07-25 NOTE — Therapy (Signed)
[address] [place], [place], [address] Phone: [phone]   Fax:  [phone]  Physical Therapy Evaluation  Patient Details  Name: Samantha Gregory MRN: [id number] Date of Birth: 01/06/1935 Referring Provider: Marlane Mingle  Encounter Date: 07/25/2017      PT End of Session - 07/25/17 1116    Visit Number 1   Date for PT Re-Evaluation 09/24/17   PT Start Time 1039   PT Stop Time 1131   PT Time Calculation (min) 52 min   Activity Tolerance Patient tolerated treatment well   Behavior During Therapy [facility] for tasks assessed/performed      Past Medical History:  Diagnosis Date  . Essential hypertension   . Heart palpitations   . Hypercholesterolemia   . Mitral regurgitation   . Orthostatic hypotension   . Paroxysmal atrial tachycardia ([place])   . PVC's (premature ventricular contractions)     Past Surgical History:  Procedure Laterality Date  . APPENDECTOMY    . TEE WITHOUT CARDIOVERSION N/A 09/08/2015   Procedure: TRANSESOPHAGEAL ECHOCARDIOGRAM (TEE);  Surgeon: Lelon Perla, MD;  Location: [facility] ENDOSCOPY;  Service: Cardiovascular;  Laterality: N/A;    There were no vitals filed for this visit.       Subjective Assessment - 07/25/17 1044    Subjective Patient reports that she fell off of a ladder on [address].  She reports that she injured her left ankle.  X-rays were negative.  She reports that the pain has gotten better, she reports that she still has pain and swelling, she is using a brace, reports that she was able to be active until noon prior to using the brace, now able to go a little longer.     Limitations Standing;Walking;House hold activities   Patient Stated Goals have less pain and be able to do her yardwork again   Currently in Pain? Yes   Pain Score 2    Pain Location Ankle   Pain Orientation Left   Pain Descriptors / Indicators Aching   Pain Type Acute pain   Pain Onset  More than a month ago   Pain Frequency Constant   Aggravating Factors  with activity and at end of the day pain can be up to 10/10   Pain Relieving Factors rest, reports that she has to rest at noon for an hour, then has to lie down every 2-3 hours, pain can be down to 2/10   Effect of Pain on Daily Activities reports limits her ability to be active            [facility] PT Assessment - 07/25/17 0001      Assessment   Medical Diagnosis left ankle sprain   Referring Provider Marlane Mingle   Onset Date/Surgical Date 04/24/17   Prior Therapy none     Precautions   Precautions None     Balance Screen   Has the patient fallen in the past 6 months Yes   How many times? 1   Has the patient had a decrease in activity level because of a fear of falling?  No   Is the patient reluctant to leave their home because of a fear of falling?  No     Home Environment   Additional Comments no stairs, does all of her housework     Prior Function   Level of Independence Independent   Vocation Retired   Leisure no exercise, just a lot of yardwork  ROM / Strength   AROM / PROM / Strength AROM;PROM;Strength     AROM   AROM Assessment Site Ankle   Right/Left Ankle Left   Left Ankle Dorsiflexion 2   Left Ankle Plantar Flexion 43   Left Ankle Inversion 25  pain lateral ankle   Left Ankle Eversion 10     PROM   Overall PROM Comments WFL's with some pain     Strength   Overall Strength Comments 4-/5 for DF and eversion with pain, 4/5 other motions     Palpation   Palpation comment very tender lateral malleolous and the achilles area, she has swelling around the lateral malleolous     Ambulation/Gait   Gait Comments no device, wears an ASO, mild antalgic gait on the left with decreased toe off            Objective measurements completed on examination: See above findings.          [facility] Adult PT Treatment/Exercise - 07/25/17 0001      Modalities   Modalities Electrical  Stimulation;Vasopneumatic     Acupuncturist Location left ankle   Electrical Stimulation Action IFC   Electrical Stimulation Parameters supine and elevated   Electrical Stimulation Goals Edema;Pain     Vasopneumatic   Number Minutes Vasopneumatic  15 minutes   Vasopnuematic Location  Ankle   Vasopneumatic Pressure Low   Vasopneumatic Temperature  35                PT Education - 07/25/17 1115    Education provided Yes   Education Details beginning AROM of the left ankle, calf stretch   Person(s) Educated Patient   Methods Explanation;Demonstration;Handout   Comprehension Verbalized understanding          PT Short Term Goals - 07/25/17 1120      PT SHORT TERM GOAL #1   Title independent with initial HEP   Time 8   Period Weeks   Status New           PT Long Term Goals - 07/25/17 1122      PT LONG TERM GOAL #1   Title decrease pain at the end of the day 50%   Time 8   Period Weeks   Status New     PT LONG TERM GOAL #2   Title increase left ankle DF to 10 degrees   Time 8   Period Weeks   Status New     PT LONG TERM GOAL #3   Title walk community distances without ASO   Time 8   Period Weeks   Status New     PT LONG TERM GOAL #4   Title understand RICE and perform at home   Time 8   Period Weeks   Status New                Plan - 07/25/17 1116    Clinical Impression Statement Patient fell off of a ladder on March 30, she reports that she has had pain and difficulty with activities since that time, reports in the AM the pain is a 2-3/10, when she is ready for bed the pain is a 10/10.  Has some limitation in DF and eversion strength due to pain and in ROM.  Wears an ASO and reports that she really cannot walk much without it, her gait has mild antalgic on the left without assistive device.  She has swelling around the  lateral malleolous.  She is 4 months S/P the injury and still in a lot of pain with  activity.    Clinical Presentation Evolving   Clinical Decision Making Low   Rehab Potential Good   PT Frequency 2x / week   PT Duration 8 weeks   PT Treatment/Interventions Vasopneumatic Device;ADLs/Self Care Home Management;Cryotherapy;Electrical Stimulation;Gait training;Stair training;Functional mobility training;Patient/family education;Neuromuscular re-education;Balance training;Therapeutic exercise;Therapeutic activities;Manual techniques   PT Next Visit Plan see if estim and vaso helped, slowly prgoress with easier exercises   Consulted and Agree with Plan of Care Patient      Patient will benefit from skilled therapeutic intervention in order to improve the following deficits and impairments:  Abnormal gait, Cardiopulmonary status limiting activity, Decreased activity tolerance, Decreased balance, Decreased mobility, Decreased strength, Increased edema, Pain, Difficulty walking, Decreased range of motion  Visit Diagnosis: Pain in left ankle and joints of left foot - Plan: PT plan of care cert/re-cert  Localized swelling, mass and lump, left lower limb - Plan: PT plan of care cert/re-cert  Stiffness of left ankle, not elsewhere classified - Plan: PT plan of care cert/re-cert  Difficulty in walking, not elsewhere classified - Plan: PT plan of care cert/re-cert      G-Codes - 01/00/71 1137    Functional Assessment Tool Used (Outpatient Only) foto 64% limitation   Functional Limitation Mobility: Walking and moving around   Mobility: Walking and Moving Around Current Status [phone]) At least 60 percent but less than 80 percent impaired, limited or restricted   Mobility: Walking and Moving Around Goal Status [phone]) At least 40 percent but less than 60 percent impaired, limited or restricted       Problem List Patient Active Problem List   Diagnosis Date Noted  . Closed compression fracture of L5 lumbar vertebra ([place]) 06/03/2017  . Abnormal cervical Papanicolaou smear  06/03/2017  . Stenosis of cervix 06/03/2017  . Tachycardia 06/03/2017  . PVC's (premature ventricular contractions)   . Moderate aortic insufficiency 09/02/2015  . Essential hypertension 09/02/2015  . Low hemoglobin 12/11/2014  . Orthostatic hypotension 03/31/2014  . Hypercholesterolemia 06/20/2011  . Osteoporosis 06/20/2011    Sumner Boast., PT 07/25/2017, 11:39 AM  [address] [place], [place], [address] Phone: [phone]   Fax:  [phone]  Name: Samantha Gregory MRN: [id number] Date of Birth: Dec 09, 1935

## 2017-08-01 ENCOUNTER — Ambulatory Visit: Payer: PPO | Attending: Family Medicine | Admitting: Physical Therapy

## 2017-08-01 ENCOUNTER — Encounter: Payer: Self-pay | Admitting: Physical Therapy

## 2017-08-01 DIAGNOSIS — R2242 Localized swelling, mass and lump, left lower limb: Secondary | ICD-10-CM | POA: Diagnosis not present

## 2017-08-01 DIAGNOSIS — M25572 Pain in left ankle and joints of left foot: Secondary | ICD-10-CM

## 2017-08-01 DIAGNOSIS — M25672 Stiffness of left ankle, not elsewhere classified: Secondary | ICD-10-CM

## 2017-08-01 DIAGNOSIS — R262 Difficulty in walking, not elsewhere classified: Secondary | ICD-10-CM | POA: Insufficient documentation

## 2017-08-01 NOTE — Therapy (Signed)
[address] [place], [place], [address] Phone: [phone]   Fax:  [phone]  Physical Therapy Treatment  Patient Details  Name: Samantha Gregory MRN: [id number] Date of Birth: 01/20/1935 Referring Provider: Marlane Mingle  Encounter Date: 08/01/2017      PT End of Session - 08/01/17 0952    Visit Number 2   Date for PT Re-Evaluation 09/24/17   PT Start Time 0925   PT Stop Time 1020   PT Time Calculation (min) 55 min      Past Medical History:  Diagnosis Date  . Essential hypertension   . Heart palpitations   . Hypercholesterolemia   . Mitral regurgitation   . Orthostatic hypotension   . Paroxysmal atrial tachycardia ([place])   . PVC's (premature ventricular contractions)     Past Surgical History:  Procedure Laterality Date  . APPENDECTOMY    . TEE WITHOUT CARDIOVERSION N/A 09/08/2015   Procedure: TRANSESOPHAGEAL ECHOCARDIOGRAM (TEE);  Surgeon: Lelon Perla, MD;  Location: [facility] ENDOSCOPY;  Service: Cardiovascular;  Laterality: N/A;    There were no vitals filed for this visit.      Subjective Assessment - 08/01/17 0929    Subjective much better, ex really helping- increased circulation and mvmt   Currently in Pain? Yes   Pain Score 2    Pain Location Ankle   Pain Orientation Left                         [facility] Adult PT Treatment/Exercise - 08/01/17 0001      Exercises   Exercises Knee/Hip;Ankle     Knee/Hip Exercises: Aerobic   Nustep L 4 5 min LE only     Modalities   Modalities Electrical Stimulation;Vasopneumatic     Electrical Stimulation   Electrical Stimulation Location left ankle   Electrical Stimulation Action IFC   Electrical Stimulation Parameters elevated, supine   Electrical Stimulation Goals Edema;Pain     Vasopneumatic   Number Minutes Vasopneumatic  15 minutes   Vasopnuematic Location  Ankle   Vasopneumatic Pressure Medium   Vasopneumatic  Temperature  35     Ankle Exercises: Standing   Other Standing Ankle Exercises sit fit ankle ROM 15 times 4 directions   Other Standing Ankle Exercises airex marching with various stance, heel raises, toe raises     Ankle Exercises: Supine   T-Band red 15 times                  PT Short Term Goals - 08/01/17 2094      PT SHORT TERM GOAL #1   Title independent with initial HEP   Status Achieved           PT Long Term Goals - 07/25/17 1122      PT LONG TERM GOAL #1   Title decrease pain at the end of the day 50%   Time 8   Period Weeks   Status New     PT LONG TERM GOAL #2   Title increase left ankle DF to 10 degrees   Time 8   Period Weeks   Status New     PT LONG TERM GOAL #3   Title walk community distances without ASO   Time 8   Period Weeks   Status New     PT LONG TERM GOAL #4   Title understand RICE and perform at home   Time  [place] - 08/01/17 0175    Clinical Impression Statement STG met, independant with HEP. tolerated all ther ex well with improved func ROM   PT Treatment/Interventions Vasopneumatic Device;ADLs/Self Care Home Management;Cryotherapy;Electrical Stimulation;Gait training;Stair training;Functional mobility training;Patient/family education;Neuromuscular re-education;Balance training;Therapeutic exercise;Therapeutic activities;Manual techniques   PT Next Visit Plan progress ROM and func strength      Patient will benefit from skilled therapeutic intervention in order to improve the following deficits and impairments:  Abnormal gait, Cardiopulmonary status limiting activity, Decreased activity tolerance, Decreased balance, Decreased mobility, Decreased strength, Increased edema, Pain, Difficulty walking, Decreased range of motion  Visit Diagnosis: Pain in left ankle and joints of left foot  Localized swelling, mass and lump, left lower limb  Stiffness of left ankle, not  elsewhere classified  Difficulty in walking, not elsewhere classified     Problem List Patient Active Problem List   Diagnosis Date Noted  . Closed compression fracture of L5 lumbar vertebra ([place]) 06/03/2017  . Abnormal cervical Papanicolaou smear 06/03/2017  . Stenosis of cervix 06/03/2017  . Tachycardia 06/03/2017  . PVC's (premature ventricular contractions)   . Moderate aortic insufficiency 09/02/2015  . Essential hypertension 09/02/2015  . Low hemoglobin 12/11/2014  . Orthostatic hypotension 03/31/2014  . Hypercholesterolemia 06/20/2011  . Osteoporosis 06/20/2011    PAYSEUR,ANGIE PTA 08/01/2017, 9:55 AM  [address] [place], [place], [address] Phone: [phone]   Fax:  [phone]  Name: Samantha Gregory MRN: [id number] Date of Birth: 02-08-1935

## 2017-08-03 ENCOUNTER — Encounter: Payer: Self-pay | Admitting: Physical Therapy

## 2017-08-03 ENCOUNTER — Ambulatory Visit: Payer: PPO | Admitting: Physical Therapy

## 2017-08-03 DIAGNOSIS — M25572 Pain in left ankle and joints of left foot: Secondary | ICD-10-CM | POA: Diagnosis not present

## 2017-08-03 DIAGNOSIS — M25672 Stiffness of left ankle, not elsewhere classified: Secondary | ICD-10-CM

## 2017-08-03 DIAGNOSIS — R262 Difficulty in walking, not elsewhere classified: Secondary | ICD-10-CM

## 2017-08-03 DIAGNOSIS — R2242 Localized swelling, mass and lump, left lower limb: Secondary | ICD-10-CM

## 2017-08-03 NOTE — Therapy (Signed)
[address] [place], [place], [address] Phone: [phone]   Fax:  [phone]  Physical Therapy Treatment  Patient Details  Name: Samantha Gregory MRN: [id number] Date of Birth: 1935/08/23 Referring Provider: Marlane Mingle  Encounter Date: 08/03/2017      PT End of Session - 08/03/17 0943    Visit Number 3   Date for PT Re-Evaluation 09/24/17   PT Start Time 0922   PT Stop Time 1015   PT Time Calculation (min) 53 min      Past Medical History:  Diagnosis Date  . Essential hypertension   . Heart palpitations   . Hypercholesterolemia   . Mitral regurgitation   . Orthostatic hypotension   . Paroxysmal atrial tachycardia ([place])   . PVC's (premature ventricular contractions)     Past Surgical History:  Procedure Laterality Date  . APPENDECTOMY    . TEE WITHOUT CARDIOVERSION N/A 09/08/2015   Procedure: TRANSESOPHAGEAL ECHOCARDIOGRAM (TEE);  Surgeon: Lelon Perla, MD;  Location: [facility] ENDOSCOPY;  Service: Cardiovascular;  Laterality: N/A;    There were no vitals filed for this visit.      Subjective Assessment - 08/03/17 0925    Subjective good,better ,best- therapy really helping   Currently in Pain? Yes   Pain Score 1    Pain Location Ankle            [facility] PT Assessment - 08/03/17 0001      AROM   AROM Assessment Site Ankle   Right/Left Ankle Left   Left Ankle Dorsiflexion 15   Left Ankle Inversion 45  no c/o pain   Left Ankle Eversion 22     Strength   Overall Strength Comments 4+/5                     [facility] Adult PT Treatment/Exercise - 08/03/17 0001      Knee/Hip Exercises: Aerobic   Nustep L 4 6 min LE only     Knee/Hip Exercises: Machines for Strengthening   Cybex Leg Press 30# 2 sets 10  calf raises 30# 3 sets 10     Knee/Hip Exercises: Standing   Heel Raises 20 reps  and toe raises- black bar     Electrical Stimulation   Electrical Stimulation  Location left ankle   Electrical Stimulation Action IFC   Electrical Stimulation Parameters elevated and supine   Electrical Stimulation Goals Edema;Pain     Vasopneumatic   Number Minutes Vasopneumatic  15 minutes   Vasopnuematic Location  Ankle   Vasopneumatic Pressure Medium   Vasopneumatic Temperature  35     Ankle Exercises: Standing   Other Standing Ankle Exercises sit fit ankle ROM 15 times 4 directions     Ankle Exercises: Supine   T-Band green 15 times                  PT Short Term Goals - 08/01/17 0953      PT SHORT TERM GOAL #1   Title independent with initial HEP   Status Achieved           PT Long Term Goals - 08/03/17 0935      PT LONG TERM GOAL #1   Title decrease pain at the end of the day 50%   Status On-going     PT LONG TERM GOAL #2   Title increase left ankle DF to 10 degrees   Status Achieved  PT LONG TERM GOAL #3   Title walk community distances without ASO   Status On-going     PT LONG TERM GOAL #4   Title understand RICE and perform at home   Status Achieved               Plan - 08/03/17 0944    Clinical Impression Statement excellent increase in ROM and MMT. Cuing for control with some ex but overall tolerated everything without increased pain. Progressing with LTGs   PT Treatment/Interventions Vasopneumatic Device;ADLs/Self Care Home Management;Cryotherapy;Electrical Stimulation;Gait training;Stair training;Functional mobility training;Patient/family education;Neuromuscular re-education;Balance training;Therapeutic exercise;Therapeutic activities;Manual techniques   PT Next Visit Plan strength/gait/func      Patient will benefit from skilled therapeutic intervention in order to improve the following deficits and impairments:  Abnormal gait, Cardiopulmonary status limiting activity, Decreased activity tolerance, Decreased balance, Decreased mobility, Decreased strength, Increased edema, Pain, Difficulty walking,  Decreased range of motion  Visit Diagnosis: Pain in left ankle and joints of left foot  Localized swelling, mass and lump, left lower limb  Stiffness of left ankle, not elsewhere classified  Difficulty in walking, not elsewhere classified     Problem List Patient Active Problem List   Diagnosis Date Noted  . Closed compression fracture of L5 lumbar vertebra ([place]) 06/03/2017  . Abnormal cervical Papanicolaou smear 06/03/2017  . Stenosis of cervix 06/03/2017  . Tachycardia 06/03/2017  . PVC's (premature ventricular contractions)   . Moderate aortic insufficiency 09/02/2015  . Essential hypertension 09/02/2015  . Low hemoglobin 12/11/2014  . Orthostatic hypotension 03/31/2014  . Hypercholesterolemia 06/20/2011  . Osteoporosis 06/20/2011    PAYSEUR,ANGIE PTA 08/03/2017, 9:46 AM  [address] [place], [place], [address] Phone: [phone]   Fax:  [phone]  Name: Aurea Aronov MRN: [id number] Date of Birth: 23-Nov-1935

## 2017-08-08 DIAGNOSIS — L570 Actinic keratosis: Secondary | ICD-10-CM | POA: Diagnosis not present

## 2017-08-08 DIAGNOSIS — X32XXXD Exposure to sunlight, subsequent encounter: Secondary | ICD-10-CM | POA: Diagnosis not present

## 2017-08-08 DIAGNOSIS — Z85828 Personal history of other malignant neoplasm of skin: Secondary | ICD-10-CM | POA: Diagnosis not present

## 2017-08-08 DIAGNOSIS — Z08 Encounter for follow-up examination after completed treatment for malignant neoplasm: Secondary | ICD-10-CM | POA: Diagnosis not present

## 2017-08-10 ENCOUNTER — Encounter: Payer: Self-pay | Admitting: Physical Therapy

## 2017-08-10 ENCOUNTER — Ambulatory Visit: Payer: PPO | Admitting: Physical Therapy

## 2017-08-10 DIAGNOSIS — R2242 Localized swelling, mass and lump, left lower limb: Secondary | ICD-10-CM

## 2017-08-10 DIAGNOSIS — M25572 Pain in left ankle and joints of left foot: Secondary | ICD-10-CM | POA: Diagnosis not present

## 2017-08-10 DIAGNOSIS — M25672 Stiffness of left ankle, not elsewhere classified: Secondary | ICD-10-CM

## 2017-08-10 DIAGNOSIS — R262 Difficulty in walking, not elsewhere classified: Secondary | ICD-10-CM

## 2017-08-10 NOTE — Therapy (Signed)
[place], [place], [address] Phone: [phone]   Fax:  [phone]  Physical Therapy Treatment  Patient Details  Name: Samantha Gregory MRN: [id number] Date of Birth: 1935/01/01 Referring Provider: Marlane Mingle  Encounter Date: 08/10/2017      PT End of Session - 08/10/17 0955    Visit Number 4   Date for PT Re-Evaluation 09/24/17   PT Start Time 0930   PT Stop Time 1025   PT Time Calculation (min) 55 min      Past Medical History:  Diagnosis Date  . Essential hypertension   . Heart palpitations   . Hypercholesterolemia   . Mitral regurgitation   . Orthostatic hypotension   . Paroxysmal atrial tachycardia ([place])   . PVC's (premature ventricular contractions)     Past Surgical History:  Procedure Laterality Date  . APPENDECTOMY    . TEE WITHOUT CARDIOVERSION N/A 09/08/2015   Procedure: TRANSESOPHAGEAL ECHOCARDIOGRAM (TEE);  Surgeon: Lelon Perla, MD;  Location: [facility] ENDOSCOPY;  Service: Cardiovascular;  Laterality: N/A;    There were no vitals filed for this visit.      Subjective Assessment - 08/10/17 0929    Subjective 75-80% better. I think I might mow today.   Currently in Pain? No/denies                         [facility] Adult PT Treatment/Exercise - 08/10/17 0001      Ambulation/Gait   Stairs Yes   Stairs Assistance 6: Modified independent (Device/Increase time)   Stair Management Technique No rails;Alternating pattern   Number of Stairs 16   Gait Comments 1000 feet all surfaces no deficiets, no AD or ASO Independant     Knee/Hip Exercises: Aerobic   Elliptical 2 fwd/2 back I 6 R 4   Nustep L 5 5 min LE only     Knee/Hip Exercises: Machines for Strengthening   Cybex Leg Press 30# 2 sets 15  calf raises 30# 2 sets 15     Knee/Hip Exercises: Standing   Heel Raises 20 reps  toe raise both on airex   Lateral Step Up Left;15 reps;Hand Hold: 2   BOSU   Forward Step Up Left;Hand Hold: 2;15 reps  BOSU   Other Standing Knee Exercises sit fitankle ROM 15 times each   Other Standing Knee Exercises walking on heels and toes     Electrical Stimulation   Electrical Stimulation Location left ankle   Electrical Stimulation Action IFC   Electrical Stimulation Goals Edema;Pain     Vasopneumatic   Number Minutes Vasopneumatic  15 minutes   Vasopnuematic Location  Ankle   Vasopneumatic Pressure Medium   Vasopneumatic Temperature  35                PT Education - 08/10/17 0955    Education provided Yes   Education Details weaning from ASO   Person(s) Educated Patient   Methods Explanation   Comprehension Verbalized understanding          PT Short Term Goals - 08/01/17 0953      PT SHORT TERM GOAL #1   Title independent with initial HEP   Status Achieved           PT Long Term Goals - 08/10/17 0939      PT LONG TERM GOAL #1   Title decrease pain at the end of the day 50%  Status Achieved     PT LONG TERM GOAL #2   Title increase left ankle DF to 10 degrees   Status Achieved     PT LONG TERM GOAL #3   Title walk community distances without ASO   Baseline edcuated on weaning out of ASO over next week   Status Partially Met     PT LONG TERM GOAL #4   Title understand RICE and perform at home   Status Achieved               Plan - 08/10/17 0941    Clinical Impression Statement all goals met except walking without ASO- discussed with pt weaning from ASO.   PT Treatment/Interventions Vasopneumatic Device;ADLs/Self Care Home Management;Cryotherapy;Electrical Stimulation;Gait training;Stair training;Functional mobility training;Patient/family education;Neuromuscular re-education;Balance training;Therapeutic exercise;Therapeutic activities;Manual techniques   PT Next Visit Plan D/C next visit is all is good without ASO      Patient will benefit from skilled therapeutic intervention in order to  improve the following deficits and impairments:  Abnormal gait, Cardiopulmonary status limiting activity, Decreased activity tolerance, Decreased balance, Decreased mobility, Decreased strength, Increased edema, Pain, Difficulty walking, Decreased range of motion  Visit Diagnosis: Pain in left ankle and joints of left foot  Localized swelling, mass and lump, left lower limb  Stiffness of left ankle, not elsewhere classified  Difficulty in walking, not elsewhere classified     Problem List Patient Active Problem List   Diagnosis Date Noted  . Closed compression fracture of L5 lumbar vertebra ([place]) 06/03/2017  . Abnormal cervical Papanicolaou smear 06/03/2017  . Stenosis of cervix 06/03/2017  . Tachycardia 06/03/2017  . PVC's (premature ventricular contractions)   . Moderate aortic insufficiency 09/02/2015  . Essential hypertension 09/02/2015  . Low hemoglobin 12/11/2014  . Orthostatic hypotension 03/31/2014  . Hypercholesterolemia 06/20/2011  . Osteoporosis 06/20/2011    Ezekial Arns,ANGIE  PTA 08/10/2017, 9:59 AM  [place], [place], [address] Phone: [phone]   Fax:  [phone]  Name: Skylie Hiott MRN: [id number] Date of Birth: [birth year]

## 2017-08-17 ENCOUNTER — Telehealth: Payer: Self-pay | Admitting: Family Medicine

## 2017-08-17 ENCOUNTER — Encounter: Payer: Self-pay | Admitting: Physical Therapy

## 2017-08-17 ENCOUNTER — Ambulatory Visit: Payer: PPO | Admitting: Physical Therapy

## 2017-08-17 DIAGNOSIS — R2242 Localized swelling, mass and lump, left lower limb: Secondary | ICD-10-CM

## 2017-08-17 DIAGNOSIS — M25572 Pain in left ankle and joints of left foot: Secondary | ICD-10-CM | POA: Diagnosis not present

## 2017-08-17 DIAGNOSIS — R262 Difficulty in walking, not elsewhere classified: Secondary | ICD-10-CM

## 2017-08-17 DIAGNOSIS — M25672 Stiffness of left ankle, not elsewhere classified: Secondary | ICD-10-CM

## 2017-08-17 NOTE — Therapy (Signed)
[place], [place], [address] Phone: [phone]   Fax:  [phone]  Physical Therapy Treatment  Patient Details  Name: Samantha Gregory MRN: [id number] Date of Birth: Mar 22, 1935 Referring Provider: Marlane Mingle  Encounter Date: 08/17/2017      PT End of Session - 08/17/17 1000    Visit Number 5   Date for PT Re-Evaluation 09/24/17   PT Start Time 0930   PT Stop Time 1015   PT Time Calculation (min) 45 min      Past Medical History:  Diagnosis Date  . Essential hypertension   . Heart palpitations   . Hypercholesterolemia   . Mitral regurgitation   . Orthostatic hypotension   . Paroxysmal atrial tachycardia ([place])   . PVC's (premature ventricular contractions)     Past Surgical History:  Procedure Laterality Date  . APPENDECTOMY    . TEE WITHOUT CARDIOVERSION N/A 09/08/2015   Procedure: TRANSESOPHAGEAL ECHOCARDIOGRAM (TEE);  Surgeon: Lelon Perla, MD;  Location: [facility] ENDOSCOPY;  Service: Cardiovascular;  Laterality: N/A;    There were no vitals filed for this visit.      Subjective Assessment - 08/17/17 0928    Subjective still alittle tender but doing great, almost resumed all activities but still caution and weaning form brace   Pain Score 1    Pain Location Ankle                         [facility] Adult PT Treatment/Exercise - 08/17/17 0001      Knee/Hip Exercises: Aerobic   Nustep L 5 6 min LE only     Knee/Hip Exercises: Machines for Strengthening   Cybex Leg Press 30# 2 sets 15  calf raises 30# 2 sets 15     Knee/Hip Exercises: Standing   Heel Raises 20 reps  + toe raises black bar   Other Standing Knee Exercises sit fit SLS 20 times each direction   Other Standing Knee Exercises incline calf raise 20     Knee/Hip Exercises: Supine   Other Supine Knee/Hip Exercises ankle 4 way 20 times green tband     Electrical Stimulation   Electrical  Stimulation Location left ankle   Electrical Stimulation Action IFC   Electrical Stimulation Goals Edema     Vasopneumatic   Number Minutes Vasopneumatic  15 minutes   Vasopnuematic Location  Ankle   Vasopneumatic Pressure Medium   Vasopneumatic Temperature  35                PT Education - 08/17/17 1000    Education provided Yes   Education Details independant gym program   Person(s) Educated Patient   Methods Explanation   Comprehension Verbalized understanding          PT Short Term Goals - 08/01/17 0953      PT SHORT TERM GOAL #1   Title independent with initial HEP   Status Achieved           PT Long Term Goals - 08/17/17 0929      PT LONG TERM GOAL #1   Title decrease pain at the end of the day 50%   Status Achieved     PT LONG TERM GOAL #2   Title increase left ankle DF to 10 degrees   Status Achieved     PT LONG TERM GOAL #3   Title walk community distances without ASO  Status Partially Met     PT LONG TERM GOAL #4   Title understand RICE and perform at home   Status Achieved               Plan - 08/17/17 0930    Clinical Impression Statement all goals met except still weaning form brace with all gait and activities ie mowing   PT Next Visit Plan D/C next visit. and transituon to independant program Encouraged to  resume ALL activities without brace      Patient will benefit from skilled therapeutic intervention in order to improve the following deficits and impairments:  Abnormal gait, Cardiopulmonary status limiting activity, Decreased activity tolerance, Decreased balance, Decreased mobility, Decreased strength, Increased edema, Pain, Difficulty walking, Decreased range of motion  Visit Diagnosis: Pain in left ankle and joints of left foot  Localized swelling, mass and lump, left lower limb  Stiffness of left ankle, not elsewhere classified  Difficulty in walking, not elsewhere classified     Problem List Patient  Active Problem List   Diagnosis Date Noted  . Closed compression fracture of L5 lumbar vertebra ([place]) 06/03/2017  . Abnormal cervical Papanicolaou smear 06/03/2017  . Stenosis of cervix 06/03/2017  . Tachycardia 06/03/2017  . PVC's (premature ventricular contractions)   . Moderate aortic insufficiency 09/02/2015  . Essential hypertension 09/02/2015  . Low hemoglobin 12/11/2014  . Orthostatic hypotension 03/31/2014  . Hypercholesterolemia 06/20/2011  . Osteoporosis 06/20/2011    Samantha Gregory,Samantha Gregory 08/17/2017, 10:01 AM  [place], [place], [address] Phone: [phone]   Fax:  [phone]  Name: Samantha Gregory MRN: [id number] Date of Birth: [birth year]

## 2017-08-17 NOTE — Telephone Encounter (Signed)
Left pt message asking to call Allison back directly at [phone] to schedule AWV + labs with Cassie and CPE with PCP. ° °*NOTE* No hx of AWV °

## 2017-08-18 ENCOUNTER — Telehealth: Payer: Self-pay | Admitting: Interventional Cardiology

## 2017-08-18 NOTE — Telephone Encounter (Signed)
New message      Pt is due to see Dr Irish Lack on 11-14-17. She want to know if she needs to come in a week early for labs.  Please call

## 2017-08-18 NOTE — Telephone Encounter (Signed)
Let pt know that at this point best to wait until appointment to get lab work completed as Dr. Clayton Bibles will decided what she needs at that time.  Will forward to his nurse in case they decided to get labs earlier, in which case someone will call to setup.

## 2017-08-18 NOTE — Telephone Encounter (Signed)
Does patient need labs prior to appointment in November?

## 2017-08-22 ENCOUNTER — Ambulatory Visit: Payer: PPO | Admitting: Physical Therapy

## 2017-08-24 ENCOUNTER — Ambulatory Visit: Payer: PPO | Admitting: Physical Therapy

## 2017-08-24 ENCOUNTER — Encounter: Payer: Self-pay | Admitting: Physical Therapy

## 2017-08-24 DIAGNOSIS — R2242 Localized swelling, mass and lump, left lower limb: Secondary | ICD-10-CM

## 2017-08-24 DIAGNOSIS — M25672 Stiffness of left ankle, not elsewhere classified: Secondary | ICD-10-CM

## 2017-08-24 DIAGNOSIS — M25572 Pain in left ankle and joints of left foot: Secondary | ICD-10-CM | POA: Diagnosis not present

## 2017-08-24 DIAGNOSIS — R262 Difficulty in walking, not elsewhere classified: Secondary | ICD-10-CM

## 2017-08-24 NOTE — Telephone Encounter (Signed)
Called and spoke to patient. Patient's appointment is 11/20 at 8:20 AM. Advised patient to come to the appointment fasting and if we needed labs we would order and draw them the day of her appointment. Patient verbalized understanding and thanked me for the call.

## 2017-08-24 NOTE — Therapy (Signed)
[place], [place], [address] Phone: [phone]   Fax:  [phone]  Physical Therapy Treatment  Patient Details  Name: Samantha Gregory MRN: [id number] Date of Birth: 1935-08-20 Referring Provider: Marlane Mingle  Encounter Date: 08/24/2017      PT End of Session - 08/24/17 0848    Visit Number 6   Date for PT Re-Evaluation 09/24/17   PT Start Time 0845   PT Stop Time 0930   PT Time Calculation (min) 45 min      Past Medical History:  Diagnosis Date  . Essential hypertension   . Heart palpitations   . Hypercholesterolemia   . Mitral regurgitation   . Orthostatic hypotension   . Paroxysmal atrial tachycardia ([place])   . PVC's (premature ventricular contractions)     Past Surgical History:  Procedure Laterality Date  . APPENDECTOMY    . TEE WITHOUT CARDIOVERSION N/A 09/08/2015   Procedure: TRANSESOPHAGEAL ECHOCARDIOGRAM (TEE);  Surgeon: Lelon Perla, MD;  Location: [facility] ENDOSCOPY;  Service: Cardiovascular;  Laterality: N/A;    There were no vitals filed for this visit.      Subjective Assessment - 08/24/17 0846    Subjective doing great , no brace since last visit and no issues   Currently in Pain? No/denies                         [facility] Adult PT Treatment/Exercise - 08/24/17 0001      Knee/Hip Exercises: Aerobic   Elliptical 2 fwd/2 back I 6 R 4   Nustep L 5 6 min LE only     Knee/Hip Exercises: Machines for Strengthening   Cybex Leg Press 30# 2 sets 15     Knee/Hip Exercises: Standing   Heel Raises 20 reps  toe raises   Lateral Step Up Left;15 reps;Hand Hold: 2  BOSU   Forward Step Up Left;Hand Hold: 2;15 reps  BOSU   Functional Squat 15 reps  BOSU   Other Standing Knee Exercises rocker board     Electrical Stimulation   Electrical Stimulation Location left ankle   Electrical Stimulation Action IFC   Electrical Stimulation Goals Edema      Vasopneumatic   Number Minutes Vasopneumatic  15 minutes   Vasopnuematic Location  Ankle   Vasopneumatic Pressure Medium   Vasopneumatic Temperature  35                  PT Short Term Goals - 08/01/17 0953      PT SHORT TERM GOAL #1   Title independent with initial HEP   Status Achieved           PT Long Term Goals - 08/24/17 0847      PT LONG TERM GOAL #3   Title walk community distances without ASO   Status Achieved               Plan - 08/24/17 0848    Clinical Impression Statement all goals met. D/C   PT Treatment/Interventions Vasopneumatic Device;ADLs/Self Care Home Management;Cryotherapy;Electrical Stimulation;Gait training;Stair training;Functional mobility training;Patient/family education;Neuromuscular re-education;Balance training;Therapeutic exercise;Therapeutic activities;Manual techniques   PT Next Visit Plan D/C      Patient will benefit from skilled therapeutic intervention in order to improve the following deficits and impairments:  Abnormal gait, Cardiopulmonary status limiting activity, Decreased activity tolerance, Decreased balance, Decreased mobility, Decreased strength, Increased edema, Pain, Difficulty walking, Decreased range of  motion  Visit Diagnosis: Pain in left ankle and joints of left foot  Localized swelling, mass and lump, left lower limb  Stiffness of left ankle, not elsewhere classified  Difficulty in walking, not elsewhere classified     Problem List Patient Active Problem List   Diagnosis Date Noted  . Closed compression fracture of L5 lumbar vertebra ([place]) 06/03/2017  . Abnormal cervical Papanicolaou smear 06/03/2017  . Stenosis of cervix 06/03/2017  . Tachycardia 06/03/2017  . PVC's (premature ventricular contractions)   . Moderate aortic insufficiency 09/02/2015  . Essential hypertension 09/02/2015  . Low hemoglobin 12/11/2014  . Orthostatic hypotension 03/31/2014  . Hypercholesterolemia 06/20/2011   . Osteoporosis 06/20/2011  PHYSICAL THERAPY DISCHARGE SUMMARY   Plan: Patient agrees to discharge.  Patient goals were met. Patient is being discharged due to meeting the stated rehab goals.  ?????      Haydin Dunn,ANGIE PTA 08/24/2017, 9:03 AM  [place], [place], [address] Phone: [phone]   Fax:  [phone]  Name: Laryn Venning MRN: [id number] Date of Birth: 1935/11/29

## 2017-08-31 NOTE — Telephone Encounter (Signed)
Agree 

## 2017-09-18 NOTE — Telephone Encounter (Signed)
Pt no longer see's Dr. Juleen China.  Removed as PCP in EPIC

## 2017-10-25 ENCOUNTER — Telehealth: Payer: Self-pay | Admitting: Interventional Cardiology

## 2017-10-25 NOTE — Telephone Encounter (Signed)
New Message     Pt would like you to put in order for Lipid , please call asap

## 2017-10-25 NOTE — Telephone Encounter (Signed)
Called and spoke to patient. Patient's appointment is 11/20 at 8:20 AM. Patient not on any cholesterol medicines. Last Lipids 12/27/16 were controlled. Advised patient to come to the appointment fasting and if we needed labs we would order and draw them the day of her appointment. Patient verbalized understanding and thanked me for the call.

## 2017-11-03 ENCOUNTER — Encounter: Payer: Self-pay | Admitting: Interventional Cardiology

## 2017-11-11 NOTE — Progress Notes (Signed)
Cardiology Office Note   Date:  11/14/2017   ID:  Samantha Gregory 1935/10/17, MRN [id number]  PCP:  Patient, No Pcp Per    No chief complaint on file. orthostatic hypotension   Wt Readings from Last 3 Encounters:  11/14/17 93 lb 12.8 oz (42.5 kg)  06/20/17 93 lb 3.2 oz (42.3 kg)  05/31/17 92 lb (41.7 kg)       History of Present Illness: Samantha Gregory is a 82 y.o. female  Who has a history of orthostatic hypotension. This has improved with liberalization of salt and fluid intake. She saw Dr. Mare Ferrari in the past.   In 2001 she had a questionable TIA and had an echocardiogram with bubble study which did not show any embolic source. She has had previous carotid Dopplers which are normal. The patient is no longer on statins. She has tried most of the statins. She had to go off the statins because it was causing her to have decreased memory. Fortunately her HDL level is satisfactory and her risk ratio is not excessive.   She was intolerant of losartan and seen in the PharmD HTN clinic.  Rash did not improve off of losartan.  BP was at goal off of losartan.  SHe has had palpitations when stressed.  In 3/18, she had dizziness with the ENT office visit.  Echo at that time was stable.     Since the last visit, she saw a PMD once, but did not want to follow with that MD.  She continues to work in the yard.  She has had to use the metoprolol, 3x/week.  She feels tired after working in the yard.  She has had issues with allergies as well.    Denies : Chest pain. Dizziness. Leg edema. Nitroglycerin use. Orthopnea.  Paroxysmal nocturnal dyspnea. Shortness of breath. Syncope.   She noticed extra bleeding when she took daily aspirin.  She cut it back to once a week.  She feels that the bleeding is better.    She reports some left groin pain, better after she belches a lot.  No problems eating or moving her bowels.   Past Medical History:  Diagnosis Date  .  Essential hypertension   . Heart palpitations   . Hypercholesterolemia   . Mitral regurgitation   . Orthostatic hypotension   . Paroxysmal atrial tachycardia ([place])   . PVC's (premature ventricular contractions)     Past Surgical History:  Procedure Laterality Date  . APPENDECTOMY    . TRANSESOPHAGEAL ECHOCARDIOGRAM (TEE) N/A 09/08/2015   Performed by Lelon Perla, MD at [facility] ENDOSCOPY     Current Outpatient Medications  Medication Sig Dispense Refill  . aspirin 81 MG tablet Take 81 mg once a week by mouth.    . B Complex-C (SUPER B COMPLEX PO) Take 1 tablet daily by mouth.    . cholecalciferol (VITAMIN D) 1000 UNITS tablet Take 1,000 Units by mouth daily.    . Diclofenac Sodium (PENNSAID) 2 % SOLN Place 1 application onto the skin 2 (two) times daily as needed. 8 g 0  . Fexofenadine HCl (ALLEGRA PO) Take 0.5 tablets daily by mouth.    . levocetirizine (XYZAL) 5 MG tablet Take 2.5 mg every evening by mouth.    . metoprolol tartrate (LOPRESSOR) 25 MG tablet Take 1 tablet (25 mg total) by mouth 2 (two) times daily as needed (Use as needed for rapid heart beat.). 60 tablet 9   No current facility-administered  medications for this visit.     Allergies:   Statins    Social History:  The patient  reports that  has never smoked. she has never used smokeless tobacco. She reports that she drinks alcohol. She reports that she does not use drugs.   Family History:  The patient's family history includes Heart attack in her father; Heart disease in her brother, sister, and sister; Hypertension in her brother; Kidney disease in her mother; Stroke in her mother, paternal aunt, and paternal uncle.    ROS:  Please see the history of present illness.   Otherwise, review of systems are positive for left groin pain.   All other systems are reviewed and negative.    PHYSICAL EXAM: VS:  BP (!) 144/82   Pulse 81   Ht 5\' 4"  (1.626 m)   Wt 93 lb 12.8 oz (42.5 kg)   SpO2 99%   BMI 16.10 kg/m   , BMI Body mass index is 16.1 kg/m. GEN: Well nourished, well developed, in no acute distress  HEENT: normal  Neck: no JVD, carotid bruits, or masses Cardiac: RRR; soft systolic murmurs, rubs, or gallops,no edema  Respiratory:  clear to auscultation bilaterally, normal work of breathing GI: soft, nontender, nondistended, + BS MS: no deformity or atrophy  Skin: warm and dry, no rash Neuro:  Strength and sensation are intact Psych: euthymic mood, full affect     Recent Labs: 12/27/2016: ALT 15; BUN 20; Creatinine, Ser 0.73; Potassium 4.2; Sodium 140   Lipid Panel    Component Value Date/Time   CHOL 222 (H) 12/27/2016 0801   TRIG 93 12/27/2016 0801   HDL 88 12/27/2016 0801   CHOLHDL 2.5 12/27/2016 0801   CHOLHDL 2.3 12/30/2015 0741   VLDL 17 12/30/2015 0741   LDLCALC 115 (H) 12/27/2016 0801   LDLDIRECT 117.0 02/05/2014 1610     Other studies Reviewed: Additional studies/ records that were reviewed today with results demonstrating: labs, echo reviewed.   ASSESSMENT AND PLAN:  1. Orthostatic hypotension: Improved with increased salt and water intake.    2. AI: Moderate in 3/18.  Normal LV function. No CHF sx. 3. PAT: HR of 120 in the ENT office in 3/18.  I suspect it may have been ATach.  4. Dizziness: Resolved.   5. PVCs: Continue metoprolol as needed.  6. Lipids checked today. 7. Left groin pain: WIll f/u with new PMD, [place].   Current medicines are reviewed at length with the patient today.  The patient concerns regarding her medicines were addressed.  The following changes have been made:  No change  Labs/ tests ordered today include:  No orders of the defined types were placed in this encounter.   Recommend 150 minutes/week of aerobic exercise Low fat, low carb, high fiber diet recommended  Disposition:   FU in 6 months   Signed, Larae Grooms, MD  11/14/2017 9:39 AM    [facility] [place], [place], [place]   [address] Phone: [phone]; Fax: [phone]

## 2017-11-14 ENCOUNTER — Encounter: Payer: Self-pay | Admitting: Interventional Cardiology

## 2017-11-14 ENCOUNTER — Other Ambulatory Visit: Payer: PPO

## 2017-11-14 ENCOUNTER — Other Ambulatory Visit: Payer: Self-pay

## 2017-11-14 ENCOUNTER — Ambulatory Visit: Payer: PPO | Admitting: Interventional Cardiology

## 2017-11-14 VITALS — BP 144/82 | HR 81 | Ht 64.0 in | Wt 93.8 lb

## 2017-11-14 DIAGNOSIS — I471 Supraventricular tachycardia: Secondary | ICD-10-CM | POA: Diagnosis not present

## 2017-11-14 DIAGNOSIS — E7800 Pure hypercholesterolemia, unspecified: Secondary | ICD-10-CM

## 2017-11-14 DIAGNOSIS — I351 Nonrheumatic aortic (valve) insufficiency: Secondary | ICD-10-CM | POA: Diagnosis not present

## 2017-11-14 DIAGNOSIS — I951 Orthostatic hypotension: Secondary | ICD-10-CM

## 2017-11-14 DIAGNOSIS — R42 Dizziness and giddiness: Secondary | ICD-10-CM

## 2017-11-14 DIAGNOSIS — I493 Ventricular premature depolarization: Secondary | ICD-10-CM | POA: Diagnosis not present

## 2017-11-14 LAB — HEPATIC FUNCTION PANEL
ALBUMIN: 4.2 g/dL (ref 3.5–4.8)
ALT: 19 IU/L (ref 0–32)
AST: 33 IU/L (ref 0–40)
Alkaline Phosphatase: 112 IU/L (ref 39–117)
Bilirubin Total: 0.6 mg/dL (ref 0.0–1.2)
Bilirubin, Direct: 0.16 mg/dL (ref 0.00–0.40)
TOTAL PROTEIN: 6.4 g/dL (ref 6.0–8.5)

## 2017-11-14 LAB — LIPID PANEL
CHOL/HDL RATIO: 2.6 ratio (ref 0.0–4.4)
CHOLESTEROL TOTAL: 221 mg/dL — AB (ref 100–199)
HDL: 85 mg/dL (ref >39)
LDL Calculated: 124 mg/dL — ABNORMAL HIGH (ref 0–99)
Triglycerides: 59 mg/dL (ref 0–149)
VLDL CHOLESTEROL CAL: 12 mg/dL (ref 5–40)

## 2017-11-14 NOTE — Patient Instructions (Signed)

## 2017-11-15 ENCOUNTER — Telehealth: Payer: Self-pay | Admitting: Interventional Cardiology

## 2017-11-15 NOTE — Telephone Encounter (Signed)
F/u Message  Pt returning RN call about results. Please call back to discuss

## 2017-11-15 NOTE — Telephone Encounter (Signed)
Patient made aware of results. Patient verbalized understanding. Patient requesting results be mailed to her. Address verified. Letter with results sent to patient.

## 2017-11-15 NOTE — Telephone Encounter (Signed)
-----   Message from Jettie Booze, MD sent at 11/14/2017  5:46 PM EST ----- Liver and lipids are stable. Continue current therapy.

## 2018-01-09 ENCOUNTER — Telehealth: Payer: Self-pay | Admitting: Interventional Cardiology

## 2018-01-09 NOTE — Telephone Encounter (Signed)
Noted  

## 2018-01-09 NOTE — Telephone Encounter (Signed)
New message  Pt verbalized that she is calling for RN  She said that labs was ordered that she did not want and she wants to confirm   What Dr.Varanasi ordered

## 2018-01-09 NOTE — Telephone Encounter (Signed)
New message  Pt verbalized that she is returning call for RN  To let [place] know that the bill has been paid   she calling the company and they confirmed it

## 2018-01-09 NOTE — Telephone Encounter (Signed)
Patient states that her insurance is not going to pay for her labs that were drawn on 11/14/17 since she had them checked in January 2018 and is asking if we have received a paper from her insurance. Made patient aware that we have not received anything from them at this time.   FYI: Patient showed up to her appointment on 11/14/17 insisting that her labs be checked prior to her appointment later that morning. Made patient aware that she should wait until after Dr. Irish Lack sees her to determine which labs were needed (as she had been previously instructed several times prior to her appointment). Patient made a big scene that morning that those labs were ordered at her request.

## 2018-02-02 ENCOUNTER — Ambulatory Visit (INDEPENDENT_AMBULATORY_CARE_PROVIDER_SITE_OTHER): Payer: PPO | Admitting: Family Medicine

## 2018-02-02 ENCOUNTER — Encounter: Payer: Self-pay | Admitting: Family Medicine

## 2018-02-02 VITALS — BP 140/70 | HR 72 | Temp 98.1°F | Ht 64.0 in | Wt 94.1 lb

## 2018-02-02 DIAGNOSIS — M810 Age-related osteoporosis without current pathological fracture: Secondary | ICD-10-CM | POA: Diagnosis not present

## 2018-02-02 DIAGNOSIS — L989 Disorder of the skin and subcutaneous tissue, unspecified: Secondary | ICD-10-CM | POA: Diagnosis not present

## 2018-02-02 DIAGNOSIS — L309 Dermatitis, unspecified: Secondary | ICD-10-CM

## 2018-02-02 DIAGNOSIS — I8393 Asymptomatic varicose veins of bilateral lower extremities: Secondary | ICD-10-CM | POA: Diagnosis not present

## 2018-02-02 MED ORDER — FEXOFENADINE HCL 180 MG PO TABS: 180.0000 mg | ORAL_TABLET | Freq: Every day | ORAL | 5 refills | Status: DC

## 2018-02-02 NOTE — Progress Notes (Signed)
Chief Complaint  Patient presents with  . Establish Care       New Patient Visit SUBJECTIVE: HPI: Samantha Gregory is an 83 y.o.female who is being seen for establishing care.  The patient was previously seen at [facility].  Osteoporosis +hx of osteoporosis.  Her most recent bone density scan was in 2011.  She reportedly has been on numerous medications for this and has failed them all.  She did see an endocrinologist for this issue.  She has a history of allergies and dry skin.  She has used several steroid creams for itchy patches.  This is not been helpful.  She takes half a tab of Allegra daily.  She uses scent free soaps and lotions. She thinks she may have itchy veins that are causing the issue.  There is an area on her lower extremity that she believes started around 1 month ago when she ran into something at the gym.  She does have a history of skin cancer, but is certain that trauma precipitated this.  She has been using alcohol on it without improvement.  Allergies  Allergen Reactions  . Statins Anxiety   Past Medical History:  Diagnosis Date  . Essential hypertension   . Heart palpitations   . Hypercholesterolemia   . Mitral regurgitation   . Orthostatic hypotension   . Paroxysmal atrial tachycardia ([place])   . PVC's (premature ventricular contractions)    Past Surgical History:  Procedure Laterality Date  . APPENDECTOMY    . TEE WITHOUT CARDIOVERSION N/A 09/08/2015   Procedure: TRANSESOPHAGEAL ECHOCARDIOGRAM (TEE);  Surgeon: Lelon Perla, MD;  Location: [facility] ENDOSCOPY;  Service: Cardiovascular;  Laterality: N/A;   Social History   Socioeconomic History  . Marital status: Widowed  Tobacco Use  . Smoking status: Never Smoker  . Smokeless tobacco: Never Used  Substance and Sexual Activity  . Alcohol use: Yes  . Drug use: No   Family History  Problem Relation Age of Onset  . Heart attack Father   . Stroke Mother   . Kidney disease Mother   .  Heart disease Brother   . Hypertension Brother   . Heart disease Sister   . Heart disease Sister   . Stroke Paternal Uncle   . Stroke Paternal Aunt      Current Outpatient Medications:  .  aspirin 81 MG tablet, Take 81 mg once a week by mouth., Disp: , Rfl:  .  B Complex-C (SUPER B COMPLEX PO), Take 1 tablet daily by mouth., Disp: , Rfl:  .  cholecalciferol (VITAMIN D) 1000 UNITS tablet, Take 1,000 Units by mouth daily., Disp: , Rfl:  .  metoprolol tartrate (LOPRESSOR) 25 MG tablet, Take 1 tablet (25 mg total) by mouth 2 (two) times daily as needed (Use as needed for rapid heart beat.)., Disp: 60 tablet, Rfl: 9 .  fexofenadine (ALLEGRA) 180 MG tablet, Take 1 tablet (180 mg total) by mouth daily., Disp: 30 tablet, Rfl: 5  No LMP recorded. Patient is postmenopausal.  ROS Skin: +lesions on RLE  Heart: Denies chest pain   OBJECTIVE: BP 140/70 (BP Location: Left Arm, Patient Position: Sitting, Cuff Size: Normal)   Pulse 72   Temp 98.1 F (36.7 C) (Oral)   Ht 5\' 4"  (1.626 m)   Wt 94 lb 2 oz (42.7 kg)   SpO2 96%   BMI 16.16 kg/m   Constitutional: -  VS reviewed -  Well developed, well nourished, appears stated age -  No  apparent distress  Psychiatric: -  Oriented to person, place, and time -  Memory intact -  Affect and mood normal -  Fluent conversation, good eye contact -  Judgment and insight age appropriate  Eye: -  Conjunctivae clear, no discharge -  Pupils symmetric, round, reactive to light  ENMT: -  MMM    Pharynx moist, no exudate, no erythema  Neck: -  No gross swelling, no palpable masses -  Thyroid midline, not enlarged, mobile, no palpable masses  Cardiovascular: -  RRR -  No LE edema  Respiratory: -  Normal respiratory effort, no accessory muscle use, no retraction -  Breath sounds equal, no wheezes, no ronchi, no crackles  Gastrointestinal: -  Bowel sounds normal -  No tenderness, no distention, no guarding, no masses  Neurological:  -  CN II - XII  grossly intact -  Sensation grossly intact to light touch, equal bilaterally  Musculoskeletal: -  No clubbing, no cyanosis -  Gait normal  Skin: -  See below -Varicose veins noted bilateral lower extremities, no tenderness to palpation -  Warm and dry to palpation   Media Information    Media Information     ASSESSMENT/PLAN: Skin lesion  Osteoporosis, unspecified osteoporosis type, unspecified pathological fracture presence  Patient instructed to sign release of records form from her previous PCP. Regarding her skin, my biggest concern is an ulcerated basal cell carcinoma.  That being said, given the history of trauma, we can try triple antibiotic ointment twice daily for the next 10 days.  I would like to see her after that and if no improvement, will perform a shave biopsy of each lesion. I would like to see her records from her endocrinologist.  I will review the medicine she has tried and failed.  She does not wish to have another bone density scan if it will not affect management which I think is reasonable.  If we do decide to go back on any sort of osteoporosis medicine, we will get a DEXA scan.  Increase dose of Allegra to 1 tab daily as the dry skin could be caused from that.  Continue using non-scented products.  Continue daily moisturization.  I do not think that her varicose veins are causing this issue.  I did offer to refer her to the vascular surgery team, however she declined at this time. Patient should return in 10 d. The patient voiced understanding and agreement to the plan.   [place], DO 02/02/18  11:53 AM

## 2018-02-02 NOTE — Patient Instructions (Addendum)
Use triple antibiotic ointment twice daily for the next 10 days until we see each other next. Keep the area clean and dry. OK to bandage if going out in public.   Continue using scent free products and daily use of lotions to your extremities.   Cancel appointment if you are doing better.  Let us know if you need anything.

## 2018-02-02 NOTE — Progress Notes (Signed)
Pre visit review using our clinic review tool, if applicable. No additional management support is needed unless otherwise documented below in the visit note. 

## 2018-02-07 ENCOUNTER — Ambulatory Visit: Payer: Self-pay | Admitting: *Deleted

## 2018-02-07 NOTE — Telephone Encounter (Signed)
Pt reports injured thumb of left hand "lifting a big rock." No open areas, bruised at thumbs "second joint, size of quarter."  Good ROM but 7/10 pain only when "rotating in circle." States "little swollen." Home Care advice given per protocol.  Instructed to call back if symptoms worsen. Reason for Disposition . Finger swelling, bruise or pain  Answer Assessment - Initial Assessment Questions 1. MECHANISM: "How did the injury happen?"     Picked up large rock 2. ONSET: "When did the injury happen?" (Minutes or hours ago)      02/05/18 3. LOCATION: "What part of the finger is injured?" "Is the nail damaged?"      Thumb of left hand 4. APPEARANCE of the INJURY: "What does the injury look like?"      Small bruise between thumb and index finger, at second joint on thumb 5. SEVERITY: "Can you use the hand normally?"  "Can you bend your fingers into a ball and then fully open them?"     Yes 6. SIZE: For cuts, bruises, or swelling, ask: "How large is it?" (e.g., inches or centimeters;  entire finger)      Size of quarter bruise, "Little swollen." 7. PAIN: "Is there pain?" If so, ask: "How bad is the pain?"    (e.g., Scale 1-10; or mild, moderate, severe)     Pain only when rotating thumb in circle. 7/10 only when moviing 8. TETANUS: For any breaks in the skin, ask: "When was the last tetanus booster?"     N/A 9. OTHER SYMPTOMS: "Do you have any other symptoms?"     No  Protocols used: FINGER INJURY-A-AH

## 2018-02-12 ENCOUNTER — Ambulatory Visit (INDEPENDENT_AMBULATORY_CARE_PROVIDER_SITE_OTHER): Payer: PPO | Admitting: Family Medicine

## 2018-02-12 ENCOUNTER — Encounter: Payer: Self-pay | Admitting: Family Medicine

## 2018-02-12 ENCOUNTER — Ambulatory Visit (HOSPITAL_BASED_OUTPATIENT_CLINIC_OR_DEPARTMENT_OTHER)
Admission: RE | Admit: 2018-02-12 | Discharge: 2018-02-12 | Disposition: A | Discharge status: Home / Self Care | Payer: PPO | Source: Ambulatory Visit | Attending: Family Medicine | Admitting: Family Medicine

## 2018-02-12 VITALS — BP 110/70 | HR 68 | Temp 97.8°F | Ht 64.0 in | Wt 93.0 lb

## 2018-02-12 DIAGNOSIS — D489 Neoplasm of uncertain behavior, unspecified: Secondary | ICD-10-CM

## 2018-02-12 DIAGNOSIS — M533 Sacrococcygeal disorders, not elsewhere classified: Secondary | ICD-10-CM | POA: Diagnosis not present

## 2018-02-12 DIAGNOSIS — C44722 Squamous cell carcinoma of skin of right lower limb, including hip: Secondary | ICD-10-CM | POA: Diagnosis not present

## 2018-02-12 NOTE — Progress Notes (Signed)
  Musculoskeletal Exam  Patient: Samantha Gregory DOB: August 10, 1935  DOS: 02/12/2018  SUBJECTIVE:  Chief Complaint:   Chief Complaint  Patient presents with  . Follow-up    hip pain    Samantha Gregory is a 83 y.o.  female for evaluation and treatment of low back/hip pain.   Onset:  3 days ago. Was lifting 100 lb bags of mulch.  Location: L side, low back/tailbone Character:  aching and sharp  Progression of issue:  is unchanged Associated symptoms: None Treatment: to date has been: activity modification.   Neurovascular symptoms: no  Skin issue continues. She thought it was related to an injury so we elected to tx w TAO for 10 days and it has not changed.   ROS: Musculoskeletal/Extremities: +Low back pain  Past Medical History:  Diagnosis Date  . Essential hypertension   . Heart palpitations   . Hypercholesterolemia   . Mitral regurgitation   . Orthostatic hypotension   . Paroxysmal atrial tachycardia ([place])   . PVC's (premature ventricular contractions)     Objective: VITAL SIGNS: BP 110/70 (BP Location: Left Arm, Patient Position: Sitting, Cuff Size: Normal)   Pulse 68   Temp 97.8 F (36.6 C) (Oral)   Ht 5\' 4"  (1.626 m)   Wt 93 lb (42.2 kg)   SpO2 96%   BMI 15.96 kg/m  Constitutional: Well formed, well developed. No acute distress. Cardiovascular: Brisk cap refill Thorax & Lungs: No accessory muscle use Musculoskeletal: low back    Tenderness to palpation: Yes, over L SI joint Deformity: no Ecchymosis: no Neurologic: Normal sensory function. No focal deficits noted.  Skin: unchanged from previous exam Psychiatric: Normal mood. Age appropriate judgment and insight. Alert & oriented x 3.    Procedure note; shave biopsy x2 over RLE Informed consent was obtained. The area was cleaned with alcohol and injected with 1.5 mL of 1% lidocaine with epinephrine at each location. A Dermablade was slightly bent and used to cut under the area of  interest. The specimen was placed in a sterile specimen cup and sent to the lab. The area was then cauterized ensuring adequate hemostasis. The area was dressed with triple antibiotic ointment and a bandage. This process was repeated lower down the leg.  There were no complications noted. The patient tolerated the procedure well.  Assessment:  Disorder of SI (sacroiliac) joint - Plan: DG Sacrum/Coccyx  Neoplasm of uncertain behavior - Plan: Dermatology pathology, PR SHAV SKIN LES 0.6-1.0 CM TRUNK,ARM,LEG  Plan: Orders as above. XR neg for fx or dislocation. Let's try Tylenol and ice to start w/, activity as tolerated. If no improvement, can consider steroid injection over SI jt.  Aftercare instructions verbalized and written down for pt.  F/u prn. The patient voiced understanding and agreement to the plan.   [place], DO 02/12/18  9:45 AM

## 2018-02-12 NOTE — Patient Instructions (Addendum)
Do not shower for the rest of the day. When you do wash it, use only soap and water. Do not vigorously scrub. Apply triple antibiotic ointment (like Neosporin) twice daily. Keep the area clean and dry.   Things to look out for: increasing pain not relieved by ibuprofen/acetaminophen, fevers, spreading redness, drainage of pus, or foul odor.  Results take around 1 week to get back.   Ice/cold pack over area for 10-15 min twice daily.  OK to take Tylenol 1000 mg (2 extra strength tabs) or 975 mg (3 regular strength tabs) every 6 hours as needed.  Heat (pad or rice pillow in microwave) over affected area, 10-15 minutes twice daily.   Your X-ray does not show any fractures or dislocations.  Let us know if you need anything.

## 2018-02-12 NOTE — Progress Notes (Signed)
Pre visit review using our clinic review tool, if applicable. No additional management support is needed unless otherwise documented below in the visit note. 

## 2018-02-13 ENCOUNTER — Telehealth: Payer: Self-pay | Admitting: *Deleted

## 2018-02-13 NOTE — Telephone Encounter (Signed)
Received note from [facility] that pt's last OV was 06/07/11 and is inactive, therefore, they do not have any medical records; forwarded to provider/SLS 02/19

## 2018-02-14 ENCOUNTER — Encounter: Payer: Self-pay | Admitting: Family Medicine

## 2018-02-14 ENCOUNTER — Other Ambulatory Visit: Payer: Self-pay | Admitting: Family Medicine

## 2018-02-14 DIAGNOSIS — C44722 Squamous cell carcinoma of skin of right lower limb, including hip: Secondary | ICD-10-CM | POA: Insufficient documentation

## 2018-02-20 DIAGNOSIS — L821 Other seborrheic keratosis: Secondary | ICD-10-CM | POA: Diagnosis not present

## 2018-02-20 DIAGNOSIS — C44722 Squamous cell carcinoma of skin of right lower limb, including hip: Secondary | ICD-10-CM | POA: Diagnosis not present

## 2018-02-28 DIAGNOSIS — C44722 Squamous cell carcinoma of skin of right lower limb, including hip: Secondary | ICD-10-CM | POA: Diagnosis not present

## 2018-02-28 DIAGNOSIS — Z85828 Personal history of other malignant neoplasm of skin: Secondary | ICD-10-CM | POA: Diagnosis not present

## 2018-03-01 ENCOUNTER — Ambulatory Visit: Payer: PPO | Admitting: Family Medicine

## 2018-03-19 ENCOUNTER — Telehealth: Payer: Self-pay | Admitting: Family Medicine

## 2018-03-19 NOTE — Telephone Encounter (Signed)
Patient informed. 

## 2018-03-19 NOTE — Telephone Encounter (Signed)
I don't think I can blame the Allegra. If she is feeling normal since this incident, there probably isn't a lot to do at this point. If she would like to be seen, have her schedule an appointment. TY.

## 2018-03-19 NOTE — Telephone Encounter (Signed)
spoike to the patient informed of all provider instructions. She has had no other symptoms since Friday.  Is back driving and doing all other normal activities. She did understand to schedule asap with PCP/or go to the ED if any symptoms return.

## 2018-03-19 NOTE — Telephone Encounter (Signed)
Pt called back and scheduled earliest appt for 3/27 @ 11am. She states if there is something she needs to do beforehand to please let her know.

## 2018-03-19 NOTE — Telephone Encounter (Signed)
Nothing to do at this time. TY.
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
## 2018-03-21 ENCOUNTER — Ambulatory Visit (INDEPENDENT_AMBULATORY_CARE_PROVIDER_SITE_OTHER): Payer: PPO | Admitting: Family Medicine

## 2018-03-21 ENCOUNTER — Encounter: Payer: Self-pay | Admitting: Family Medicine

## 2018-03-21 VITALS — BP 124/72 | HR 54 | Temp 97.4°F | Ht 64.0 in | Wt 92.4 lb

## 2018-03-21 DIAGNOSIS — R4701 Aphasia: Secondary | ICD-10-CM

## 2018-03-21 NOTE — Patient Instructions (Addendum)
You can stop using heat.  If you do not hear anything about your scan in the next several days, call our office and ask for an update.  If this incident happens again, go to the ER.  Let us know if you need anything.

## 2018-03-21 NOTE — Progress Notes (Signed)
Pre visit review using our clinic review tool, if applicable. No additional management support is needed unless otherwise documented below in the visit note. 

## 2018-03-21 NOTE — Progress Notes (Signed)
Chief Complaint  Patient presents with  . Follow-up    incident on saturday    Subjective: Patient is a 83 y.o. female here for incident on Sat.  Around noon, the patient went to get her usual [employer] and noticed that she could not speak.  This lasted for approximately 1 hour and spontaneously returned to her surprise.  She had no other issues such as difficulty swallowing, numbness, tingling, weakness, vision changes, headaches, or balance issues.  She has been asymptomatic since then.  She denies any history of stroke.  The patient does note that she has been under more stress compared to usual.  She did take Allegra capsules rather than tablets and wonders if that is the cause.  No other alcohol or illicit drug use.  Her lower back is improving.  She questions whether she needs to use heat any longer noting that it is not particularly helpful.   ROS: Heart: Denies chest pain  Lungs: Denies SOB   Family History  Problem Relation Age of Onset  . Heart attack Father   . Stroke Mother   . Kidney disease Mother   . Heart disease Brother   . Hypertension Brother   . Heart disease Sister   . Heart disease Sister   . Stroke Paternal Uncle   . Stroke Paternal Aunt    Past Medical History:  Diagnosis Date  . Essential hypertension   . Heart palpitations   . Hypercholesterolemia   . Mitral regurgitation   . Orthostatic hypotension   . Paroxysmal atrial tachycardia ([place])   . PVC's (premature ventricular contractions)   . Squamous cell carcinoma, leg, right    Allergies  Allergen Reactions  . Statins Anxiety    Current Outpatient Medications:  .  aspirin 81 MG tablet, Take 81 mg once a week by mouth., Disp: , Rfl:  .  B Complex-C (SUPER B COMPLEX PO), Take 1 tablet daily by mouth., Disp: , Rfl:  .  cholecalciferol (VITAMIN D) 1000 UNITS tablet, Take 1,000 Units by mouth daily., Disp: , Rfl:  .  fexofenadine (ALLEGRA) 180 MG tablet, Take 1 tablet (180 mg total) by mouth  daily., Disp: 30 tablet, Rfl: 5 .  metoprolol tartrate (LOPRESSOR) 25 MG tablet, Take 1 tablet (25 mg total) by mouth 2 (two) times daily as needed (Use as needed for rapid heart beat.)., Disp: 60 tablet, Rfl: 9  Objective: BP 124/72 (BP Location: Left Arm, Patient Position: Sitting, Cuff Size: Normal)   Pulse (!) 54   Temp (!) 97.4 F (36.3 C) (Oral)   Ht 5\' 4"  (1.626 m)   Wt 92 lb 6 oz (41.9 kg)   SpO2 99%   BMI 15.86 kg/m  General: Awake, appears stated age HEENT: MMM, EOMi, PERLLA Heart: RRR, no bruits Lungs: CTAB, no rales, wheezes or rhonchi. No accessory muscle use Abd: BS+, soft, NT, ND, no masses or organomegaly MSK: 5/5 strength throughout Neuro: No cerebellar signs, 2/4 patellar reflex, 1/4 calcaneal and biceps reflex bilaterally, no clonus.  Speech is fluent and goal oriented. Psych: Age appropriate judgment and insight, normal affect and mood  Assessment and Plan: Aphasia - Plan: MR Brain Wo Contrast  Orders as above. Ck above to r/o anything sinister or residual issue. If this happens again, she is to go to ER.  F/u prn for this issue.  The patient voiced understanding and agreement to the plan.  [place], DO 03/21/18  12:27 PM

## 2018-03-24 ENCOUNTER — Ambulatory Visit (HOSPITAL_BASED_OUTPATIENT_CLINIC_OR_DEPARTMENT_OTHER)
Admission: RE | Admit: 2018-03-24 | Discharge: 2018-03-24 | Disposition: A | Discharge status: Home / Self Care | Payer: PPO | Source: Ambulatory Visit | Attending: Family Medicine | Admitting: Family Medicine

## 2018-03-24 DIAGNOSIS — R4701 Aphasia: Secondary | ICD-10-CM | POA: Insufficient documentation

## 2018-03-24 DIAGNOSIS — R9089 Other abnormal findings on diagnostic imaging of central nervous system: Secondary | ICD-10-CM | POA: Diagnosis not present

## 2018-03-24 DIAGNOSIS — R4781 Slurred speech: Secondary | ICD-10-CM | POA: Diagnosis not present

## 2018-04-12 ENCOUNTER — Ambulatory Visit (INDEPENDENT_AMBULATORY_CARE_PROVIDER_SITE_OTHER): Payer: PPO | Admitting: Family Medicine

## 2018-04-12 ENCOUNTER — Encounter: Payer: Self-pay | Admitting: Family Medicine

## 2018-04-12 VITALS — BP 120/70 | HR 72 | Temp 98.1°F | Ht 64.0 in | Wt 94.4 lb

## 2018-04-12 DIAGNOSIS — D489 Neoplasm of uncertain behavior, unspecified: Secondary | ICD-10-CM

## 2018-04-12 DIAGNOSIS — Z9109 Other allergy status, other than to drugs and biological substances: Secondary | ICD-10-CM

## 2018-04-12 MED ORDER — LEVOCETIRIZINE DIHYDROCHLORIDE 5 MG PO TABS: 5.0000 mg | ORAL_TABLET | Freq: Every evening | ORAL | 2 refills | Status: DC

## 2018-04-12 NOTE — Progress Notes (Signed)
Chief Complaint  Patient presents with  . spots on back are itching    Subjective: Patient is a 83 y.o. female here for spots on her back that are itching.  She has been using various potency steroid cream that has been mildly helpful. She sees derm after being dx'd with skin cancer.  She has a history of allergies and used to be on Allegra, capsules have been called in that were covered by insurance.  She had an episode of dysphasia after this and believes it could be related to the medicine.  She has not taken anything since then.  She does use lotion daily.  There are 2 lesions on her back that are both itchy and she feels that they are growing.  They had been frozen by her dermatologist in the past, however she believes they are coming back.  She would like them removed if possible.  She has a history of allergies that were tested for in her late 58s.  She would like to be tested again if possible.  She denies any shortness of breath or swelling.   ROS: Skin: +itching Lungs: Denies SOB   Family History  Problem Relation Age of Onset  . Heart attack Father   . Stroke Mother   . Kidney disease Mother   . Heart disease Brother   . Hypertension Brother   . Heart disease Sister   . Heart disease Sister   . Stroke Paternal Uncle   . Stroke Paternal Aunt    Past Medical History:  Diagnosis Date  . Essential hypertension   . Heart palpitations   . Hypercholesterolemia   . Mitral regurgitation   . Orthostatic hypotension   . Paroxysmal atrial tachycardia ([place])   . PVC's (premature ventricular contractions)   . Squamous cell carcinoma, leg, right    Allergies  Allergen Reactions  . Statins Anxiety    Current Outpatient Medications:  .  Calcium 500-100 MG-UNIT CHEW, Chew 1 tablet by mouth daily., Disp: , Rfl:  .  cholecalciferol (VITAMIN D) 1000 UNITS tablet, Take 1,000 Units by mouth daily., Disp: , Rfl:  .  metoprolol tartrate (LOPRESSOR) 25 MG tablet, Take 1 tablet (25 mg  total) by mouth 2 (two) times daily as needed (Use as needed for rapid heart beat.)., Disp: 60 tablet, Rfl: 9 .  fexofenadine (ALLEGRA) 180 MG tablet, Take 1 tablet (180 mg total) by mouth daily. (Patient not taking: Reported on 04/12/2018), Disp: 30 tablet, Rfl: 5 .  levocetirizine (XYZAL) 5 MG tablet, Take 1 tablet (5 mg total) by mouth every evening., Disp: 30 tablet, Rfl: 2  Objective: BP 120/70 (BP Location: Left Arm, Patient Position: Sitting, Cuff Size: Normal)   Pulse 72   Temp 98.1 F (36.7 C) (Oral)   Ht 5\' 4"  (1.626 m)   Wt 94 lb 6 oz (42.8 kg)   SpO2 97%   BMI 16.20 kg/m  General: Awake, appears stated age HEENT: MMM, EOMi Heart: RRR, no murmurs Lungs: CTAB, no rales, wheezes or rhonchi. No accessory muscle use Skin: Numerous lentigo on back; on L upper back there is a raised and scaly lesion approx 0.8 x 1 cm in dimension. No TTP or drainage. There is a small lesion on the opposite side of her back that is scaly and slightly raised, 0.4 x 0.3 cm in diameter. No fluctuance, drainage, ttp.  Psych: Age appropriate judgment and insight, normal affect and mood  Assessment and Plan: Environmental allergies - Plan: Allergy Panel, Region  2, Grasses-([facility]), Food Allergy Profile  Neoplasm of uncertain behavior  Orders as above. Will ck basic allergy panel. If unremarkable, will refer to allergy team.  I suggested she go back on a daily antihistamine to help with itching.  I called in Xyzal, over-the-counter options, tabs in particular, were recommended as well.  Use lotion at least twice daily. We will have her return at her earliest convenience to have the skin lesions removed and sent to the lab. The patient voiced understanding and agreement to the plan.  [place], DO 04/12/18  3:19 PM

## 2018-04-12 NOTE — Progress Notes (Signed)
Pre visit review using our clinic review tool, if applicable. No additional management support is needed unless otherwise documented below in the visit note. 

## 2018-04-12 NOTE — Patient Instructions (Addendum)
Claritin (loratadine), Allegra (fexofenadine; tabs), Zyrtec (cetirizine); these are listed in order from weakest to strongest. Generic, and therefore cheaper, options are in the parentheses.   Flonase (fluticasone); nasal spray that is over the counter. 2 sprays each nostril, once daily. Aim towards the same side eye when you spray.  There are available OTC, and the generic versions, which may be cheaper, are in parentheses. Show this to a pharmacist if you have trouble finding any of these items.

## 2018-04-19 ENCOUNTER — Encounter: Payer: Self-pay | Admitting: Family Medicine

## 2018-04-19 ENCOUNTER — Ambulatory Visit (INDEPENDENT_AMBULATORY_CARE_PROVIDER_SITE_OTHER): Payer: PPO | Admitting: Family Medicine

## 2018-04-19 VITALS — BP 146/78 | HR 55 | Resp 16 | Ht 64.0 in | Wt 94.0 lb

## 2018-04-19 DIAGNOSIS — L570 Actinic keratosis: Secondary | ICD-10-CM | POA: Diagnosis not present

## 2018-04-19 DIAGNOSIS — L821 Other seborrheic keratosis: Secondary | ICD-10-CM | POA: Diagnosis not present

## 2018-04-19 DIAGNOSIS — D489 Neoplasm of uncertain behavior, unspecified: Secondary | ICD-10-CM

## 2018-04-19 NOTE — Progress Notes (Signed)
Chief Complaint  Patient presents with  . Procedure    on back   Pt here for skin procedure on back. All questions answered  BP (!) 146/78 (BP Location: Left Arm, Patient Position: Sitting, Cuff Size: Normal)   Pulse (!) 55   Resp 16   Ht 5\' 4"  (1.626 m)   Wt 94 lb (42.6 kg)   SpO2 98%   BMI 16.14 kg/m  See below for pics of skin lesions.   Media Information     L upper back    Media Information     R upper back  Procedure note; shave biopsy x 2 Informed consent was obtained. The area on the L side of the back was cleaned with alcohol and injected with 1 mL of 1% lidocaine with epinephrine. A Dermablade was slightly bent and used to cut under the area of interest. The specimen was placed in a sterile specimen cup and sent to the lab. The area was then cauterized ensuring adequate hemostasis. The area was dressed with triple antibiotic ointment and a bandage. There were no complications noted. This process was repeated on the R side of the back.  The patient tolerated the procedure well.  Neoplasm of uncertain behavior - Plan: Dermatology pathology, PR EXC SKIN MALIG <0.5 CM TRUNK,ARM,LEG, PR EXC SKIN MALIG 0.6-1 CM TRUNK,ARM,LEG  Orders as above. Aftercare instructions verbalized and written down.  We will be in touch regarding results.  F/u prn. Pt voiced understanding and agreement to the plan.  [place] Oyster [place] 4:22 PM 04/19/18

## 2018-04-19 NOTE — Patient Instructions (Signed)
1 business week for results of skin biopsies to return.  Do not shower for the rest of the day. When you do wash it, use only soap and water. Do not vigorously scrub. Apply triple antibiotic ointment (like Neosporin) twice daily. Keep the area clean and dry.   Things to look out for: increasing pain not relieved by ibuprofen/acetaminophen, fevers, spreading redness, drainage of pus, or foul odor.  Let us know if you need anything.

## 2018-04-26 ENCOUNTER — Ambulatory Visit (INDEPENDENT_AMBULATORY_CARE_PROVIDER_SITE_OTHER): Payer: PPO | Admitting: Family Medicine

## 2018-04-26 ENCOUNTER — Encounter: Payer: Self-pay | Admitting: Family Medicine

## 2018-04-26 VITALS — BP 136/80 | HR 86 | Resp 16 | Ht 64.0 in | Wt 93.4 lb

## 2018-04-26 DIAGNOSIS — L989 Disorder of the skin and subcutaneous tissue, unspecified: Secondary | ICD-10-CM

## 2018-04-26 MED ORDER — TRIAMCINOLONE ACETONIDE 0.1 % EX CREA: 1.0000 "application " | TOPICAL_CREAM | Freq: Two times a day (BID) | CUTANEOUS | 0 refills | Status: DC

## 2018-04-26 NOTE — Progress Notes (Signed)
Chief Complaint  Patient presents with  . Spots on leg    scar from surgery on right leg-not healing, itching  . Knot on hand    left hand, one week, hard to touch, soreness    Samantha Gregory is a 83 y.o. female here for a skin complaint.  Pt had a spot come on leg where she was dx'd with SCC and had excision. New lesions very similar to previous ones have started to grow. Another similar lesion on L forearm and L hand. They itch, some pain on leg. No drainage, no new soaps/lotions/topicals. She used to see Dr. Fredia Beets and wishes to see him again.  ROS:  Const: No fevers Skin: As noted in HPI  Past Medical History:  Diagnosis Date  . Essential hypertension   . Heart palpitations   . Hypercholesterolemia   . Mitral regurgitation   . Orthostatic hypotension   . Paroxysmal atrial tachycardia ([place])   . PVC's (premature ventricular contractions)   . Squamous cell carcinoma, leg, right   . Squamous cell carcinoma, leg, right    Allergies  Allergen Reactions  . Statins Anxiety     BP 136/80 (BP Location: Left Arm, Patient Position: Sitting, Cuff Size: Normal)   Pulse 86   Resp 16   Ht 5\' 4"  (1.626 m)   Wt 93 lb 6.4 oz (42.4 kg)   SpO2 98%   BMI 16.03 kg/m  Gen: awake, alert, appearing stated age Lungs: No accessory muscle use Skin: See below. No drainage, erythema, TTP, fluctuance. Psych: Age appropriate judgment and insight   R shin   L hand    L forearm  Skin lesion - Plan: triamcinolone cream (KENALOG) 0.1 %  Orders as above. Cream for symptomatic management. I would like her to f/u with her derm team. She would like to see Dr. Fredia Beets and has his contact info. She will call the office today. I am concerned about possible SCC again given the very similar appearance to previous lesions. She is to let us know if she does need a referral and we will do it. F/u prn. The patient voiced understanding and agreement to the plan.  [place],  DO 04/26/18 9:04 AM

## 2018-04-26 NOTE — Patient Instructions (Addendum)
Call Dr. Nevada Crane today! If you need a referral, let us know, but it sounds like you have seen him recently enough to just call.   Tell Dr. Nevada Crane you had a SCC (squamous cell carcinoma) of your leg that strongly resembles the other lesions on your leg and now hand.  The cream is to help with the itch only. I do not think it will cure what is happening on your legs/hands.   Let us know if you need anything.

## 2018-05-08 DIAGNOSIS — X32XXXD Exposure to sunlight, subsequent encounter: Secondary | ICD-10-CM | POA: Diagnosis not present

## 2018-05-08 DIAGNOSIS — C44629 Squamous cell carcinoma of skin of left upper limb, including shoulder: Secondary | ICD-10-CM | POA: Diagnosis not present

## 2018-05-08 DIAGNOSIS — L308 Other specified dermatitis: Secondary | ICD-10-CM | POA: Diagnosis not present

## 2018-05-08 DIAGNOSIS — L570 Actinic keratosis: Secondary | ICD-10-CM | POA: Diagnosis not present

## 2018-05-14 NOTE — Progress Notes (Signed)
Cardiology Office Note   Date:  05/17/2018   ID:  Samantha, Gregory 04/09/1935, MRN [id number]  PCP:  [name], DO    No chief complaint on file.  Orthostatic hypotension  Wt Readings from Last 3 Encounters:  05/17/18 94 lb 6.4 oz (42.8 kg)  04/26/18 93 lb 6.4 oz (42.4 kg)  04/19/18 94 lb (42.6 kg)       History of Present Illness: Samantha Gregory is a 83 y.o. female  Who has a history of orthostatic hypotension. This has improved with liberalization of salt and fluid intake. She saw Dr. Mare Ferrari in the past.   In 2001 she had a questionable TIA and had an echocardiogram with bubble study which did not show any embolic source. She has had previous carotid Dopplers which are normal, per Dr. Mare Ferrari records. The patient is no longer on statins. She has tried most of the statins. She had to go off the statins because it was causing her to have decreased memory. Her HDL level was satisfactory and her risk ratio was though to be low.   She was intolerant of losartan and seen in the PharmD HTN clinic. Rash did not improve off of losartan. BP was at goal off of losartan.  SHe has had palpitations when stressed.  In 3/18, she had dizziness with the ENT office visit.  Echo at that time was stable.   HR of 120 in the ENT office in 3/18.  I suspect it may have been ATach.   Denies : Chest pain. Dizziness. Leg edema. Nitroglycerin use. Orthopnea. Paroxysmal nocturnal dyspnea. Shortness of breath. Syncope.   Occasional palpitations, particularly when she goes to the bathroom in the morning.  Resolved with a prn metoprolol.  MRI of head done in 3/19: 1. No acute intracranial abnormality. 2. Moderate chronic small vessel ischemic disease. 3. Chronically enlarged extra-axial spaces over both frontal convexities, stable to slightly larger than in 2011. 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   Past Medical History:  Diagnosis Date  . Essential hypertension   . Heart palpitations   . Hypercholesterolemia   . Mitral regurgitation   . Orthostatic hypotension   . Paroxysmal atrial tachycardia ([place])   . PVC's (premature ventricular contractions)   . Squamous cell carcinoma, leg, right   . Squamous cell carcinoma, leg, right     Past Surgical History:  Procedure Laterality Date  . APPENDECTOMY    . TEE WITHOUT CARDIOVERSION N/A 09/08/2015   Procedure: TRANSESOPHAGEAL ECHOCARDIOGRAM (TEE);  Surgeon: Lelon Perla, MD;  Location: [facility] ENDOSCOPY;  Service: Cardiovascular;  Laterality: N/A;     Current Outpatient Medications  Medication Sig Dispense Refill  . betamethasone dipropionate (DIPROLENE) 0.05 % cream Apply 1 application topically 2 (two) times daily as needed for itching.  3  . Calcium 500-100 MG-UNIT CHEW Chew 1 tablet by mouth daily.    . cholecalciferol (VITAMIN D) 1000 UNITS tablet Take 1,000 Units by mouth daily.    Marland Kitchen levocetirizine (XYZAL) 5 MG tablet Take 1 tablet (5 mg total) by mouth every evening. 30 tablet 2  . metoprolol tartrate (LOPRESSOR) 25 MG tablet Take 1 tablet (25 mg total) by mouth 2 (two) times daily as needed (Use as needed for rapid heart beat.). 60 tablet 9  . triamcinolone cream (KENALOG) 0.1 % Apply 1 application topically 2 (two) times daily. 30 g 0   No current facility-administered medications for  this visit.     Allergies:   Statins    Social History:  The patient  reports that she has never smoked. She has never used smokeless tobacco. She reports that she drinks alcohol. She reports that she does not use drugs.   Family History:  The patient's family history includes Heart attack in her father; Heart disease in her brother, sister, and sister; Hypertension in her brother; Kidney disease in her mother; Stroke in her mother, paternal aunt, and paternal uncle.    ROS:  Please see the history of present illness.    Otherwise, review of systems are positive for palpitations, easy bruising and bleeding from minor cuts.   All other systems are reviewed and negative.    PHYSICAL EXAM: VS:  BP 124/82   Pulse 63   Ht 5\' 4"  (1.626 m)   Wt 94 lb 6.4 oz (42.8 kg)   BMI 16.20 kg/m  , BMI Body mass index is 16.2 kg/m. GEN: Well nourished, well developed, in no acute distress  HEENT: normal  Neck: no JVD, carotid bruits, or masses Cardiac: RRR; no murmurs, rubs, or gallops,no edema  Respiratory:  clear to auscultation bilaterally, normal work of breathing GI: soft, nontender, nondistended, + BS MS: no deformity or atrophy  Skin: warm and dry, several abrasions from the dermatologist office Neuro:  Strength and sensation are intact Psych: euthymic mood, full affect   EKG:   The ekg ordered today demonstrates NSR, LVH   Recent Labs: 11/14/2017: ALT 19   Lipid Panel    Component Value Date/Time   CHOL 221 (H) 11/14/2017 0745   TRIG 59 11/14/2017 0745   HDL 85 11/14/2017 0745   CHOLHDL 2.6 11/14/2017 0745   CHOLHDL 2.3 12/30/2015 0741   VLDL 17 12/30/2015 0741   LDLCALC 124 (H) 11/14/2017 0745   LDLDIRECT 117.0 02/05/2014 9163     Other studies Reviewed: Additional studies/ records that were reviewed today with results demonstrating: LDL below 130 as noted above.   ASSESSMENT AND PLAN:  1. Orthostatic hypotension. BP controlled. Continue current meds.  She can do a better job of staying hydrated.  2. AI: No CHF sx.  EF 60-65% in 3/18. Moderate AI.  Given lack of sx, would not pursue TEE at this time.   3. PAT: HR of PAT with HR of 120 in the ENT office.  Rare sx which are controlled by the single metoprolol tablet. 4. PVCs: Continue metoprolol as needed. 5. She prefers to stop aspirin at this time due to bleeding.  No h/o CAD.     Current medicines are reviewed at length with the patient today.  The patient concerns regarding her medicines were addressed.  The following changes have  been made:  No change  Labs/ tests ordered today include:  No orders of the defined types were placed in this encounter.   Recommend 150 minutes/week of aerobic exercise Low fat, low carb, high fiber diet recommended  Disposition:   FU in 6 months   Signed, Larae Grooms, MD  05/17/2018 8:12 AM    [facility] [address], [place], [place]  [address] Phone: [phone]; Fax: [phone]

## 2018-05-15 DIAGNOSIS — L258 Unspecified contact dermatitis due to other agents: Secondary | ICD-10-CM | POA: Diagnosis not present

## 2018-05-17 ENCOUNTER — Encounter: Payer: Self-pay | Admitting: Interventional Cardiology

## 2018-05-17 ENCOUNTER — Ambulatory Visit (INDEPENDENT_AMBULATORY_CARE_PROVIDER_SITE_OTHER): Payer: PPO | Admitting: Interventional Cardiology

## 2018-05-17 VITALS — BP 124/82 | HR 63 | Ht 64.0 in | Wt 94.4 lb

## 2018-05-17 DIAGNOSIS — I493 Ventricular premature depolarization: Secondary | ICD-10-CM | POA: Diagnosis not present

## 2018-05-17 DIAGNOSIS — I951 Orthostatic hypotension: Secondary | ICD-10-CM | POA: Diagnosis not present

## 2018-05-17 DIAGNOSIS — I471 Supraventricular tachycardia: Secondary | ICD-10-CM

## 2018-05-17 DIAGNOSIS — I351 Nonrheumatic aortic (valve) insufficiency: Secondary | ICD-10-CM

## 2018-05-17 NOTE — Patient Instructions (Signed)

## 2018-05-18 ENCOUNTER — Telehealth: Payer: Self-pay

## 2018-05-18 NOTE — Telephone Encounter (Addendum)
Returned call to patient. Patient had questions about the diagnoses printed out on her AVS yesterday and wanted to know what they meant (orthostatic hypotension, AI, PAT, and PVCs). Reviewed what each diagnosis meant with the patient and made her aware that these are not new things for her and that these are the things that we follow for her to make sure that they have not progressed or worsened. Made her aware that everything looked good at her appointment yesterday and that is why we did not change anything. Instructed the patient to continue current medicines and let us know if she developed any symptoms. Patient verbalized understanding and was appreciative of the call.

## 2018-05-18 NOTE — Telephone Encounter (Signed)
-----   Message from [place] sent at 05/18/2018  9:31 AM EDT ----- Contact: [phone] [place]!   Patient called asking for you to call back stated she was confused on a few things that were discussed yesterday in clinic.   Thanks, Maudie Mercury

## 2018-05-28 ENCOUNTER — Ambulatory Visit (INDEPENDENT_AMBULATORY_CARE_PROVIDER_SITE_OTHER): Payer: PPO | Admitting: Family Medicine

## 2018-05-28 ENCOUNTER — Encounter: Payer: Self-pay | Admitting: Family Medicine

## 2018-05-28 VITALS — BP 122/68 | HR 65 | Temp 98.0°F | Ht 64.0 in | Wt 91.4 lb

## 2018-05-28 DIAGNOSIS — L235 Allergic contact dermatitis due to other chemical products: Secondary | ICD-10-CM

## 2018-05-28 HISTORY — DX: Allergic contact dermatitis due to other chemical products: L23.5

## 2018-05-28 NOTE — Progress Notes (Signed)
Chief Complaint  Patient presents with  . Allergies    Subjective: Patient is a 83 y.o. female here for f/u.  Pt had itching on her scalp so she had allergy testing through her dermatologist.  She found that she is allergic to the following: Imidazolidinyl Urea Gold Sodium Thiosulfate CL+ Me- Isothiazolinone  She has changed her shampoo and already notices improvement.  She also had questions about skin cancer/sunscreen use.  ROS: Skin: No new lesions   Past Medical History:  Diagnosis Date  . Essential hypertension   . Heart palpitations   . Hypercholesterolemia   . Mitral regurgitation   . Orthostatic hypotension   . Paroxysmal atrial tachycardia ([place])   . PVC's (premature ventricular contractions)   . Squamous cell carcinoma, leg, right   . Squamous cell carcinoma, leg, right    Objective: BP 122/68 (BP Location: Left Arm, Patient Position: Sitting, Cuff Size: Normal)   Pulse 65   Temp 98 F (36.7 C) (Oral)   Ht 5\' 4"  (1.626 m)   Wt 91 lb 6 oz (41.4 kg)   SpO2 97%   BMI 15.68 kg/m  General: Awake, appears stated age Skin: +numerous lentigo, no suspicious lesions at time Lungs: No accessory muscle use Psych: Age appropriate judgment and insight, normal affect and mood  Assessment and Plan: Chemical induced allergic contact dermatitis  Reviewed allergies, will update her allergy list. She also had a question about a bill from an MRI that I ordered.  She had an episode in March of this year for which I recommended MRI of the head.  She has a $200 co-pay which insurance company states they will not pay because a physician did not recommend this test.  Not only do I remember ordering this test and recommending it, my note also says so in March.  I will return to our office manager regarding this issue. F/u prn. The patient voiced understanding and agreement to the plan.  Greater than 15 minutes were spent face to face with the patient with greater than 50% of  this time spent counseling on above allergies, skin cancer prevention, and coordinating care with MRI results.    [place], DO 05/28/18  10:05 AM

## 2018-05-28 NOTE — Progress Notes (Signed)
Pre visit review using our clinic review tool, if applicable. No additional management support is needed unless otherwise documented below in the visit note. 

## 2018-05-28 NOTE — Patient Instructions (Signed)
Let us know if anything changes with your skin.  I will reach out to our team regarding your charge. I am not the one to fix that issue, but I will find someone who can.  Let us know if you need anything.

## 2018-06-12 DIAGNOSIS — Z08 Encounter for follow-up examination after completed treatment for malignant neoplasm: Secondary | ICD-10-CM | POA: Diagnosis not present

## 2018-06-12 DIAGNOSIS — Z85828 Personal history of other malignant neoplasm of skin: Secondary | ICD-10-CM | POA: Diagnosis not present

## 2018-06-14 ENCOUNTER — Ambulatory Visit (INDEPENDENT_AMBULATORY_CARE_PROVIDER_SITE_OTHER): Payer: PPO | Admitting: Family Medicine

## 2018-06-14 ENCOUNTER — Encounter: Payer: Self-pay | Admitting: Family Medicine

## 2018-06-14 VITALS — BP 120/82 | HR 72 | Temp 98.0°F | Ht 64.0 in | Wt 92.1 lb

## 2018-06-14 DIAGNOSIS — M654 Radial styloid tenosynovitis [de Quervain]: Secondary | ICD-10-CM | POA: Insufficient documentation

## 2018-06-14 HISTORY — DX: Radial styloid tenosynovitis (de quervain): M65.4

## 2018-06-14 NOTE — Patient Instructions (Addendum)
Optional: Ice/cold pack over area for 10-15 min twice daily.  OK to take Tylenol 1000 mg (2 extra strength tabs) or 975 mg (3 regular strength tabs) every 6 hours as needed.  Where the "thumb spica splint" at night and if you do anything that flares up pain.   Wrist and Forearm Exercises Do exercises exactly as told by your health care provider and adjust them as directed. It is normal to feel mild stretching, pulling, tightness, or discomfort as you do these exercises, but you should stop right away if you feel sudden pain or your pain gets worse.   RANGE OF MOTION EXERCISES These exercises warm up your muscles and joints and improve the movement and flexibility of your injured wrist and forearm. These exercises also help to relieve pain, numbness, and tingling. These exercises are done using the muscles in your injured wrist and forearm. Exercise A: Wrist Flexion, Active 1. With your fingers relaxed, bend your wrist forward as far as you can. 2. Hold this position for 30 seconds. Repeat 2 times. Complete this exercise 3 times per week. Exercise B: Wrist Extension, Active 1. With your fingers relaxed, bend your wrist backward as far as you can. 2. Hold this position for 30 seconds. Repeat 2 times. Complete this exercise 3 times per week. Exercise C: Supination, Active  1. Stand or sit with your arms at your sides. 2. Bend your left / right elbow to an "L" shape (90 degrees). 3. Turn your palm upward until you feel a gentle stretch on the inside of your forearm. 4. Hold this position for 30 seconds. 5. Slowly return your palm to the starting position. Repeat 2 times. Complete this exercise 3 times per week. Exercise D: Pronation, Active  1. Stand or sit with your arms at your sides. 2. Bend your left / right elbow to an "L" shape (90 degrees). 3. Turn your palm downward until you feel a gentle stretch on the top of your forearm. 4. Hold this position for 30 seconds. 5. Slowly return  your palm to the starting position. Repeat 2 times. Complete this exercise once a day.  STRETCHING EXERCISES These exercises warm up your muscles and joints and improve the movement and flexibility of your injured wrist and forearm. These exercises also help to relieve pain, numbness, and tingling. These exercises are done using your healthy wrist and forearm to help stretch the muscles in your injured wrist and forearm. Exercise E: Wrist Flexion, Passive  1. Extend your left / right arm in front of you, relax your wrist, and point your fingers downward. 2. Gently push on the back of your hand. Stop when you feel a gentle stretch on the top of your forearm. 3. Hold this position for 30 seconds. Repeat 2 times. Complete this exercise 3 times per week. Exercise F: Wrist Extension, Passive  1. Extend your left / right arm in front of you and turn your palm upward. 2. Gently pull your palm and fingertips back so your fingers point downward. You should feel a gentle stretch on the palm-side of your forearm. 3. Hold this position for 30 seconds. Repeat 2 times. Complete this exercise 3 times per week. Exercise G: Forearm Rotation, Supination, Passive 1. Sit with your left / right elbow bent to an "L" shape (90 degrees) with your forearm resting on a table. 2. Keeping your upper body and shoulder still, use your other hand to rotate your forearm palm-up until you feel a gentle to moderate  stretch. 3. Hold this position for 30 seconds. 4. Slowly release the stretch and return to the starting position. Repeat 2 times. Complete this exercise 3 times per week. Exercise H: Forearm Rotation, Pronation, Passive 1. Sit with your left / right elbow bent to an "L" shape (90 degrees) with your forearm resting on a table. 2. Keeping your upper body and shoulder still, use your other hand to rotate your forearm palm-down until you feel a gentle to moderate stretch. 3. Hold this position for 30  seconds. 4. Slowly release the stretch and return to the starting position. Repeat 2 times. Complete this exercise 3 times per week.  STRENGTHENING EXERCISES These exercises build strength and endurance in your wrist and forearm. Endurance is the ability to use your muscles for a long time, even after they get tired. Exercise I: Wrist Flexors  1. Sit with your left / right forearm supported on a table and your hand resting palm-up over the edge of the table. Your elbow should be bent to an "L" shape (about 90 degrees) and be below the level of your shoulder. 2. Hold a 3-5 lb weight in your left / right hand. Or, hold a rubber exercise band or tube in both hands, keeping your hands at the same level and hip distance apart. There should be a slight tension in the exercise band or tube. 3. Slowly curl your hand up toward your forearm. 4. Hold this position for 3 seconds. 5. Slowly lower your hand back to the starting position. Repeat 2 times. Complete this exercise 3 times per week. Exercise J: Wrist Extensors  1. Sit with your left / right forearm supported on a table and your hand resting palm-down over the edge of the table. Your elbow should be bent to an "L" shape (about 90 degrees) and be below the level of your shoulder. 2. Hold a 3-5 lb weight in your left / right hand. Or, hold a rubber exercise band or tube in both hands, keeping your hands at the same level and hip distance apart. There should be a slight tension in the exercise band or tube. 3. Slowly curl your hand up toward your forearm. 4. Hold this position for 3 seconds. 5. Slowly lower your hand back to the starting position. Repeat 2 times. Complete this exercise 3 times per week. Exercise K: Forearm Rotation, Supination  1. Sit with your left / right forearm supported on a table and your hand resting palm-down. Your elbow should be at your side, bent to an "L" shape (about 90 degrees), and below the level of your shoulder.  Keep your wrist stable and in a neutral position throughout the exercise. 2. Gently hold a lightweight hammer with your left / right hand. 3. Without moving your elbow or wrist, slowly rotate your palm upward to a thumbs-up position. 4. Hold this position for 3 seconds. 5. Slowly return your forearm to the starting position. Repeat 2 times. Complete this exercise 3 times per week. Exercise L: Forearm Rotation, Pronation  1. Sit with your left / right forearm supported on a table and your hand resting palm-up. Your elbow should be at your side, bent to an "L" shape (about 90 degrees), and below the level of your shoulder. Keep your wrist stable. Do not allow it to move backward or forward during the exercise. 2. Gently hold a lightweight hammer with your left / right hand. 3. Without moving your elbow or wrist, slowly rotate your palm and hand  upward to a thumbs-up position. 4. Hold this position for 3 seconds. 5. Slowly return your forearm to the starting position. Repeat 2 times. Complete this exercise 3 times per week. Exercise M: Grip Strengthening  1. Hold one of these items in your left / right hand: play dough, therapy putty, a dense sponge, a stress ball, or a large, rolled sock. 2. Squeeze as hard as you can without increasing pain. 3. Hold this position for 5 seconds. 4. Slowly release your grip. Repeat 2 times. Complete this exercise 3 times per week.  This information is not intended to replace advice given to you by your health care provider. Make sure you discuss any questions you have with your health care provider. Document Released: 10/26/2005 Document Revised: 09/05/2016 Document Reviewed: 09/06/2015 Elsevier Interactive Patient Education  [employer].

## 2018-06-14 NOTE — Progress Notes (Signed)
Musculoskeletal Exam  Patient: Samantha Gregory DOB: [birth year]  DOS: 06/14/2018  SUBJECTIVE:  Chief Complaint:   Chief Complaint  Patient presents with  . Hand Pain    left thumb pain    Samantha Gregory is a 83 y.o.  female for evaluation and treatment of L thumb pain.   Onset:  Several mo Location: L thumb base Character:  aching and sharp  Progression of issue:  is unchanged Associated symptoms: Some warmth/swelling; no fevers, bruising Treatment: to date has been none.   Neurovascular symptoms: no  ROS: Musculoskeletal/Extremities: +L thumb/wrist pain  Past Medical History:  Diagnosis Date  . Essential hypertension   . Heart palpitations   . Hypercholesterolemia   . Mitral regurgitation   . Orthostatic hypotension   . Paroxysmal atrial tachycardia ([place])   . PVC's (premature ventricular contractions)   . Squamous cell carcinoma, leg, right   . Squamous cell carcinoma, leg, right     Objective: VITAL SIGNS: BP 120/82 (BP Location: Left Arm, Patient Position: Sitting, Cuff Size: Normal)   Pulse 72   Temp 98 F (36.7 C) (Oral)   Ht 5\' 4"  (1.626 m)   Wt 92 lb 2 oz (41.8 kg)   SpO2 97%   BMI 15.81 kg/m  Constitutional: Well formed, well developed. No acute distress. Cardiovascular: Brisk cap refill Thorax & Lungs: No accessory muscle use Musculoskeletal: L wrist   Normal active range of motion: yes.   Normal passive range of motion: yes Tenderness to palpation: Yes over extensor poll brevis Deformity: no Ecchymosis: no Tests positive: +Finkelstein's Neurologic: Normal sensory function.  Psychiatric: Normal mood. Age appropriate judgment and insight. Alert & oriented x 3.    Assessment:  De Quervain's tenosynovitis, left  Plan: Thumb spica, stretches/exercises, ice, Tylenol. Check on MRI payment issue, a message has been sent to OM. F/u in 1 mo. If no improvement, will consider OT vs injection. The patient voiced understanding and  agreement to the plan.   [place], DO 06/14/18  9:49 AM

## 2018-06-14 NOTE — Progress Notes (Signed)
Pre visit review using our clinic review tool, if applicable. No additional management support is needed unless otherwise documented below in the visit note. 

## 2018-07-05 ENCOUNTER — Ambulatory Visit (INDEPENDENT_AMBULATORY_CARE_PROVIDER_SITE_OTHER): Payer: PPO | Admitting: Family Medicine

## 2018-07-05 ENCOUNTER — Encounter: Payer: Self-pay | Admitting: Family Medicine

## 2018-07-05 VITALS — BP 132/80 | HR 64 | Temp 98.1°F | Resp 18 | Wt 91.6 lb

## 2018-07-05 DIAGNOSIS — R1032 Left lower quadrant pain: Secondary | ICD-10-CM | POA: Diagnosis not present

## 2018-07-05 NOTE — Patient Instructions (Addendum)
Try an enema the next time this happens. It does not sound like anything sinister is going on.   Let us know if you need anything.

## 2018-07-05 NOTE — Progress Notes (Signed)
Chief Complaint  Patient presents with  . Abdominal Pain    Abdominal/pelvic pain, burping X several decades    Samantha Gregory is here for abdominal pain.  Duration: many years; this will happen 1-2 times per year Nighttime awakenings? No Bleeding? No Weight loss? No  LLQ, fullness/sharp when very severe Happens intermittently lasting around 2.5 hours. Palliation: none Provocation: none Associated symptoms: belching Denies: fever, nausea, vomiting and bowel changes Treatment to date: Tylenol, biscodyl  ROS: Constitutional: No fevers GI: No N/V/D/C, no bleeding + pain  Past Medical History:  Diagnosis Date  . Essential hypertension   . Heart palpitations   . Hypercholesterolemia   . Mitral regurgitation   . Orthostatic hypotension   . Paroxysmal atrial tachycardia ([place])   . PVC's (premature ventricular contractions)   . Squamous cell carcinoma, leg, right   . Squamous cell carcinoma, leg, right    BP 132/80 (BP Location: Left Arm, Patient Position: Sitting, Cuff Size: Normal)   Pulse 64   Temp 98.1 F (36.7 C)   Resp 18   Wt 91 lb 9.6 oz (41.5 kg)   SpO2 98%   BMI 15.72 kg/m  Gen.: Awake, alert, appears stated age 83: Mucous membranes moist without mucosal lesions Heart: Regular rate and rhythm without murmurs Lungs: Clear auscultation bilaterally, no rales or wheezing, normal effort without accessory muscle use. Abdomen: Bowel sounds are present. Abdomen is soft, nontender, nondistended, no masses or organomegaly. Negative Murphy's, Rovsing's, McBurney's, and Carnett's sign. Psych: Age appropriate judgment and insight. Normal mood and affect.  Intermittent left lower quadrant abdominal pain  Consider an enema for this when it happens again. Nothing sinister sounds like it is going on. She has been under care of GI in past and no further workup rec'd. She is currently asymptomatic. F/u prn regarding this issue. Pt voiced understanding and agreement to  the plan.  [place], DO 07/05/18 9:00 AM

## 2018-07-11 DIAGNOSIS — D3132 Benign neoplasm of left choroid: Secondary | ICD-10-CM | POA: Diagnosis not present

## 2018-07-11 DIAGNOSIS — H5310 Unspecified subjective visual disturbances: Secondary | ICD-10-CM | POA: Diagnosis not present

## 2018-07-11 DIAGNOSIS — Z961 Presence of intraocular lens: Secondary | ICD-10-CM | POA: Diagnosis not present

## 2018-07-11 DIAGNOSIS — H5211 Myopia, right eye: Secondary | ICD-10-CM | POA: Diagnosis not present

## 2018-07-13 ENCOUNTER — Telehealth: Payer: Self-pay

## 2018-07-13 ENCOUNTER — Encounter: Payer: Self-pay | Admitting: Interventional Cardiology

## 2018-07-13 DIAGNOSIS — H5712 Ocular pain, left eye: Secondary | ICD-10-CM

## 2018-07-13 DIAGNOSIS — H47022 Hemorrhage in optic nerve sheath, left eye: Secondary | ICD-10-CM

## 2018-07-13 NOTE — Telephone Encounter (Signed)
Verbal order received from Dr. Irish Lack to order Carotid US. Appointment scheduled for 7/23 at 1:30 PM. Patient aware test is to be done at [facility] office and will arrive at 1:15 PM.

## 2018-07-13 NOTE — Telephone Encounter (Signed)
Dr. Ellie Lunch calling from [facility] and states that she saw the patient last week. She states that the patient has been having chronic pain in her left eye and that she has some optic nerve hemorrhages and she is concerned for ischemia. She is asking when the patient's last carotid doppler was. Made her aware that we do not have one on file for the patient. She is asking that we order a carotid doppler for the patient and fax the results to [phone]. Made Dr. Ellie Lunch aware that I would receive verbal order from Dr. Irish Lack and that we would call and schedule the patient and fax the results to her office once completed.

## 2018-07-16 ENCOUNTER — Ambulatory Visit: Payer: PPO | Admitting: Family Medicine

## 2018-07-17 ENCOUNTER — Ambulatory Visit (HOSPITAL_COMMUNITY)
Admission: RE | Admit: 2018-07-17 | Discharge: 2018-07-17 | Disposition: A | Discharge status: Home / Self Care | Payer: PPO | Source: Ambulatory Visit | Attending: Internal Medicine | Admitting: Internal Medicine

## 2018-07-17 DIAGNOSIS — H47022 Hemorrhage in optic nerve sheath, left eye: Secondary | ICD-10-CM

## 2018-07-17 DIAGNOSIS — H5712 Ocular pain, left eye: Secondary | ICD-10-CM | POA: Diagnosis not present

## 2018-07-20 NOTE — Telephone Encounter (Signed)
This encounter was created in error - please disregard.

## 2018-07-20 NOTE — Telephone Encounter (Deleted)
-----   Message from Jettie Booze, MD sent at 07/20/2018  8:49 AM EDT ----- No significant plaque noted.

## 2018-07-20 NOTE — Telephone Encounter (Signed)
Notes recorded by Drue Novel I, RN on 07/20/2018 at 10:01 AM EDT Left detailed message of results on VM (DPR on file). Made patient aware that the results would be forwarded to Dr. Ellie Lunch and instructed patient to continue follow up with her regarding her left eye pain. Instructed for patient to call back with any questions. ------  Notes recorded by Jettie Booze, MD on 07/20/2018 at 8:49 AM EDT No significant plaque noted.

## 2018-08-10 DIAGNOSIS — H5310 Unspecified subjective visual disturbances: Secondary | ICD-10-CM | POA: Diagnosis not present

## 2018-08-10 DIAGNOSIS — H5712 Ocular pain, left eye: Secondary | ICD-10-CM | POA: Diagnosis not present

## 2018-11-19 ENCOUNTER — Ambulatory Visit (INDEPENDENT_AMBULATORY_CARE_PROVIDER_SITE_OTHER): Payer: PPO | Admitting: Family Medicine

## 2018-11-19 ENCOUNTER — Encounter: Payer: Self-pay | Admitting: Family Medicine

## 2018-11-19 ENCOUNTER — Ambulatory Visit (HOSPITAL_BASED_OUTPATIENT_CLINIC_OR_DEPARTMENT_OTHER)
Admission: RE | Admit: 2018-11-19 | Discharge: 2018-11-19 | Disposition: A | Discharge status: Home / Self Care | Payer: PPO | Source: Ambulatory Visit | Attending: Family Medicine | Admitting: Family Medicine

## 2018-11-19 ENCOUNTER — Encounter

## 2018-11-19 VITALS — BP 144/88 | HR 63 | Temp 98.2°F | Ht 64.0 in | Wt 89.2 lb

## 2018-11-19 DIAGNOSIS — S41112A Laceration without foreign body of left upper arm, initial encounter: Secondary | ICD-10-CM

## 2018-11-19 DIAGNOSIS — S22000A Wedge compression fracture of unspecified thoracic vertebra, initial encounter for closed fracture: Secondary | ICD-10-CM | POA: Diagnosis not present

## 2018-11-19 DIAGNOSIS — L853 Xerosis cutis: Secondary | ICD-10-CM

## 2018-11-19 DIAGNOSIS — R0781 Pleurodynia: Secondary | ICD-10-CM | POA: Diagnosis not present

## 2018-11-19 DIAGNOSIS — Z23 Encounter for immunization: Secondary | ICD-10-CM

## 2018-11-19 HISTORY — DX: Xerosis cutis: L85.3

## 2018-11-19 NOTE — Progress Notes (Signed)
Musculoskeletal Exam  Patient: Samantha Gregory DOB: 18-May-1935  DOS: 11/19/2018  SUBJECTIVE:  Chief Complaint:   Chief Complaint  Patient presents with  . Fall    rib pain    Samantha Gregory is a 83 y.o.  female for evaluation and treatment of R side pain.   Onset:  2 days ago. Fell in basement while carrying a big basement.  Location: R side Character:  aching and sharp  Progression of issue:  is unchanged Associated symptoms: pain with deep breath Treatment: to date has been none.   Neurovascular symptoms: no  Hx of itchy skin.  She has been using Aveeno daily.  She does have a history of dry skin.  She was using various over-the-counter and prescription creams that she believes is contributing to this.  Patient also has been taking Xyzal that she does not notice it is helpful.  ROS: Musculoskeletal/Extremities: +rib pain Skin: +dry skin, itching  Past Medical History:  Diagnosis Date  . Essential hypertension   . Heart palpitations   . Hypercholesterolemia   . Mitral regurgitation   . Orthostatic hypotension   . Paroxysmal atrial tachycardia ([place])   . PVC's (premature ventricular contractions)   . Squamous cell carcinoma, leg, right   . Squamous cell carcinoma, leg, right     Objective: VITAL SIGNS: BP (!) 144/88 (BP Location: Right Arm, Patient Position: Sitting, Cuff Size: Normal)   Pulse 63   Temp 98.2 F (36.8 C) (Oral)   Ht 5\' 4"  (1.626 m)   Wt 89 lb 4 oz (40.5 kg)   SpO2 96%   BMI 15.32 kg/m  Constitutional: Well formed, well developed. No acute distress. Cardiovascular: Brisk cap refill Thorax & Lungs: No accessory muscle use Musculoskeletal: R side; examined in presence of female chaperone.   Good range of motion of left upper extremity Tenderness to palpation: Yes over anterior and mid axillary line along ribs 8-10 Deformity: no Ecchymosis: no Crepitus: No Neurologic: Normal sensory function.  Skin: Dry skin noted; tears in skin  noted on LUE.  Psychiatric: Normal mood. Age appropriate judgment and insight. Alert & oriented x 3.    Assessment:  Dry skin dermatitis  Rib pain - Plan: DG Ribs Unilateral Left  Skin tear of left upper extremity  Plan: Lotion twice daily, continue Xyzal. Continue Tylenol, ice, taking deep breaths when able.  Will check x-ray. Triple antibiotic ointment twice daily.  We will redress her wounds today.  Home care instructions also discussed. F/u prn.  Update tetanus shot today. The patient voiced understanding and agreement to the plan.   [place], DO 11/19/18  2:44 PM

## 2018-11-19 NOTE — Patient Instructions (Addendum)
Use lotion at least twice daily, included after your bath.  Apply triple antibiotic ointment twice daily over open wounds.  OK to take Tylenol 1000 mg (2 extra strength tabs) or 975 mg (3 regular strength tabs) every 6 hours as needed.  Ice/cold pack over area for 10-15 min twice daily.  Keep areas clean and dry.   Gentle washing with soap and water in shower. No baths.

## 2018-11-19 NOTE — Progress Notes (Signed)
Pre visit review using our clinic review tool, if applicable. No additional management support is needed unless otherwise documented below in the visit note. 

## 2018-11-19 NOTE — Addendum Note (Signed)
Addended by: Sharon Seller B on: 11/19/2018 03:33 PM   Modules accepted: Orders

## 2018-11-28 ENCOUNTER — Other Ambulatory Visit: Payer: Self-pay | Admitting: Family Medicine

## 2018-11-28 ENCOUNTER — Ambulatory Visit (HOSPITAL_BASED_OUTPATIENT_CLINIC_OR_DEPARTMENT_OTHER)
Admission: RE | Admit: 2018-11-28 | Discharge: 2018-11-28 | Disposition: A | Discharge status: Home / Self Care | Payer: PPO | Source: Ambulatory Visit | Attending: Family Medicine | Admitting: Family Medicine

## 2018-11-28 ENCOUNTER — Ambulatory Visit (INDEPENDENT_AMBULATORY_CARE_PROVIDER_SITE_OTHER): Payer: PPO | Admitting: Family Medicine

## 2018-11-28 ENCOUNTER — Encounter: Payer: Self-pay | Admitting: Family Medicine

## 2018-11-28 VITALS — BP 128/80 | HR 79 | Temp 97.9°F | Ht 64.0 in | Wt 90.2 lb

## 2018-11-28 DIAGNOSIS — S22000A Wedge compression fracture of unspecified thoracic vertebra, initial encounter for closed fracture: Secondary | ICD-10-CM | POA: Insufficient documentation

## 2018-11-28 DIAGNOSIS — E2839 Other primary ovarian failure: Secondary | ICD-10-CM

## 2018-11-28 DIAGNOSIS — R0781 Pleurodynia: Secondary | ICD-10-CM

## 2018-11-28 DIAGNOSIS — S32010A Wedge compression fracture of first lumbar vertebra, initial encounter for closed fracture: Secondary | ICD-10-CM | POA: Diagnosis not present

## 2018-11-28 HISTORY — DX: Wedge compression fracture of unspecified thoracic vertebra, initial encounter for closed fracture: S22.000A

## 2018-11-28 MED ORDER — LEVOCETIRIZINE DIHYDROCHLORIDE 5 MG PO TABS: 5.0000 mg | ORAL_TABLET | Freq: Every evening | ORAL | 2 refills | Status: DC

## 2018-11-28 NOTE — Progress Notes (Signed)
Pre visit review using our clinic review tool, if applicable. No additional management support is needed unless otherwise documented below in the visit note. 

## 2018-11-28 NOTE — Progress Notes (Signed)
Chief Complaint  Patient presents with  . Results    xray    Subjective: Patient is a 83 y.o. female here for f/u rib pain.  Patient seen 1 week ago after a fall and diagnosed with a rib contusion.  X-ray did not show any fracture of the rib, however did show multiple compression fractures in the thoracic and lumbar region.  She has never had midline back pain.  Since the fall, she has noticed some left-sided back pain in addition to left-sided rib pain.  She has been using Tylenol which has been helpful.  She has not been using any ice or heat.  No numbness, tingling, or weakness.   ROS: MSK: +rib pain Neuro: No weakness  Past Medical History:  Diagnosis Date  . Essential hypertension   . Heart palpitations   . Hypercholesterolemia   . Mitral regurgitation   . Orthostatic hypotension   . Paroxysmal atrial tachycardia ([place])   . PVC's (premature ventricular contractions)   . Squamous cell carcinoma, leg, right   . Squamous cell carcinoma, leg, right     Objective: BP 128/80 (BP Location: Left Arm, Patient Position: Sitting, Cuff Size: Normal)   Pulse 79   Temp 97.9 F (36.6 C) (Oral)   Ht 5\' 4"  (1.626 m)   Wt 90 lb 4 oz (40.9 kg)   SpO2 98%   BMI 15.49 kg/m  General: Awake, appears stated age MSK: + TTP over the left rib cage, there is still no excessive warmth, crepitus, fluctuance, deformity, or ecchymosis; there is no tenderness over the midline of the spine; there is tenderness to palpation over the thoracic and upper lumbar paraspinal musculature and medial erector spinae muscle on the left. Lungs: No accessory muscle use Psych: Age appropriate judgment and insight, normal affect and mood  Assessment and Plan: Rib pain  Compression fracture of body of thoracic vertebra (HCC) - Plan: DG THORACOLUMABAR SPINE  Continue Tylenol, ice, activity as tolerated.  I did refill her Xyzal as sneezing exacerbates her pain.  Heat for the muscular pain of the back. We will check  x-ray of vertebrae to determine how severe compression fractures are. Follow-up pending above. The patient voiced understanding and agreement to the plan.  [employer], DO 11/28/18  10:10 AM

## 2018-11-28 NOTE — Patient Instructions (Addendum)
Stay on Tylenol. This will slowly get better over next several weeks.   Ice/cold pack over rib area for 10-15 min twice daily.  Heat (pad or rice pillow in microwave) over affected area on back, 10-15 minutes twice daily.   We will be in touch regarding your X-ray.  Let us know if you need anything.

## 2018-11-29 ENCOUNTER — Ambulatory Visit (HOSPITAL_BASED_OUTPATIENT_CLINIC_OR_DEPARTMENT_OTHER)
Admission: RE | Admit: 2018-11-29 | Discharge: 2018-11-29 | Disposition: A | Discharge status: Home / Self Care | Payer: PPO | Source: Ambulatory Visit | Attending: Family Medicine | Admitting: Family Medicine

## 2018-11-29 DIAGNOSIS — Z780 Asymptomatic menopausal state: Secondary | ICD-10-CM | POA: Diagnosis not present

## 2018-11-29 DIAGNOSIS — E2839 Other primary ovarian failure: Secondary | ICD-10-CM | POA: Diagnosis not present

## 2018-11-29 DIAGNOSIS — M810 Age-related osteoporosis without current pathological fracture: Secondary | ICD-10-CM | POA: Diagnosis not present

## 2018-12-05 ENCOUNTER — Ambulatory Visit (INDEPENDENT_AMBULATORY_CARE_PROVIDER_SITE_OTHER): Payer: PPO | Admitting: Family Medicine

## 2018-12-05 ENCOUNTER — Encounter: Payer: Self-pay | Admitting: Family Medicine

## 2018-12-05 VITALS — BP 118/80 | HR 65 | Temp 97.5°F | Ht 64.0 in | Wt 89.0 lb

## 2018-12-05 DIAGNOSIS — S22000A Wedge compression fracture of unspecified thoracic vertebra, initial encounter for closed fracture: Secondary | ICD-10-CM

## 2018-12-05 DIAGNOSIS — M545 Low back pain, unspecified: Secondary | ICD-10-CM

## 2018-12-05 DIAGNOSIS — M810 Age-related osteoporosis without current pathological fracture: Secondary | ICD-10-CM

## 2018-12-05 LAB — COMPREHENSIVE METABOLIC PANEL
ALT: 14 U/L (ref 0–35)
AST: 25 U/L (ref 0–37)
Albumin: 4.3 g/dL (ref 3.5–5.2)
Alkaline Phosphatase: 110 U/L (ref 39–117)
BUN: 26 mg/dL — AB (ref 6–23)
CO2: 32 mEq/L (ref 19–32)
Calcium: 9.6 mg/dL (ref 8.4–10.5)
Chloride: 100 mEq/L (ref 96–112)
Creatinine, Ser: 0.78 mg/dL (ref 0.40–1.20)
GFR: 75.35 mL/min (ref >60.00)
Glucose, Bld: 71 mg/dL (ref 70–99)
Potassium: 5.3 mEq/L — ABNORMAL HIGH (ref 3.5–5.1)
Sodium: 137 mEq/L (ref 135–145)
Total Bilirubin: 0.6 mg/dL (ref 0.2–1.2)
Total Protein: 7 g/dL (ref 6.0–8.3)

## 2018-12-05 LAB — VITAMIN D 25 HYDROXY (VIT D DEFICIENCY, FRACTURES): VITD: 45.19 ng/mL (ref 30.00–100.00)

## 2018-12-05 NOTE — Patient Instructions (Signed)
Let's give this until 01/02/19. If you do not continue to improve, send me a message or call.   I think this is more muscle-related than bone at the time.  Continue taking calcium and Vit D.   Stay on the Tylenol.  Continue heat.   Let us know if you need anything.

## 2018-12-05 NOTE — Progress Notes (Signed)
Pre visit review using our clinic review tool, if applicable. No additional management support is needed unless otherwise documented below in the visit note. 

## 2018-12-05 NOTE — Progress Notes (Signed)
Chief Complaint  Patient presents with  . Back Pain    Subjective: Patient is a 83 y.o. female here for f/u back pain.  Continues to have left-sided lower back pain that started after a fall.  Imaging showed compression fractures in the thorax and lumbar region.  She denies any midline pain.  She has a known history of osteoporosis and is tried numerous medications without any relief.  She does not wish to pursue any further treatment.  She does take vitamin D and calcium daily.  No numbness, tingling, or weakness.  She feels her pain is overall getting better.  Heat has been helpful as well as Tylenol.  ROS: MSK: +back pain Neuro: No weakness  Past Medical History:  Diagnosis Date  . Essential hypertension   . Heart palpitations   . Hypercholesterolemia   . Mitral regurgitation   . Orthostatic hypotension   . Paroxysmal atrial tachycardia ([place])   . PVC's (premature ventricular contractions)   . Squamous cell carcinoma, leg, right   . Squamous cell carcinoma, leg, right     Objective: BP 118/80 (BP Location: Left Arm, Patient Position: Sitting, Cuff Size: Small)   Pulse 65   Temp (!) 97.5 F (36.4 C) (Oral)   Ht 5\' 4"  (1.626 m)   Wt 89 lb (40.4 kg)   SpO2 95%   BMI 15.28 kg/m  General: Awake, appears stated age MSK: No midline ttp; +TTP over lumbar parasp msc on L Lungs: No accessory muscle use Psych: Age appropriate judgment and insight, normal affect and mood  Assessment and Plan: Compression fracture of body of thoracic vertebra (HCC)  Acute left-sided low back pain without sciatica  Osteoporosis, unspecified osteoporosis type, unspecified pathological fracture presence - Plan: Vitamin D (25 hydroxy), Comprehensive metabolic panel  I am still not convinced that this is related to her compression fractures that were found incidentally.  I did offer to send her to the neurosurgery team, however she would like to see if things continue to improve.  If they do not or  worsen, she will let us know and we will pursue referral. I would like her to increase her calcium intake and we will check her vitamin D today. Follow-up pending the above. The patient voiced understanding and agreement to the plan.  Greater than 25 minutes were spent face to face with the patient with greater than 50% of this time spent counseling on anatomy, compression fractures, prognosis, treatment options, Vit D/Ca supplementation, osteoporosis.   [place], DO 12/05/18  12:09 PM

## 2018-12-06 ENCOUNTER — Other Ambulatory Visit: Payer: Self-pay | Admitting: Family Medicine

## 2018-12-06 DIAGNOSIS — E875 Hyperkalemia: Secondary | ICD-10-CM

## 2018-12-10 ENCOUNTER — Other Ambulatory Visit: Payer: PPO

## 2018-12-11 ENCOUNTER — Other Ambulatory Visit (INDEPENDENT_AMBULATORY_CARE_PROVIDER_SITE_OTHER): Payer: PPO

## 2018-12-11 DIAGNOSIS — E875 Hyperkalemia: Secondary | ICD-10-CM

## 2018-12-11 LAB — BASIC METABOLIC PANEL
BUN: 26 mg/dL — ABNORMAL HIGH (ref 6–23)
CO2: 31 mEq/L (ref 19–32)
Calcium: 9.3 mg/dL (ref 8.4–10.5)
Chloride: 101 mEq/L (ref 96–112)
Creatinine, Ser: 0.89 mg/dL (ref 0.40–1.20)
GFR: 64.7 mL/min (ref >60.00)
Glucose, Bld: 96 mg/dL (ref 70–99)
Potassium: 4 mEq/L (ref 3.5–5.1)
Sodium: 140 mEq/L (ref 135–145)

## 2018-12-25 ENCOUNTER — Telehealth: Payer: Self-pay | Admitting: Family Medicine

## 2018-12-25 NOTE — Telephone Encounter (Signed)
Spoke with Samantha Gregory regarding AWV. Patient stated that she would like to give the office a call back when she is ready to schedule her appointment. SF

## 2019-01-02 ENCOUNTER — Ambulatory Visit: Payer: PPO | Admitting: Family Medicine

## 2019-01-03 ENCOUNTER — Ambulatory Visit (INDEPENDENT_AMBULATORY_CARE_PROVIDER_SITE_OTHER): Payer: PPO | Admitting: Family Medicine

## 2019-01-03 ENCOUNTER — Encounter: Payer: Self-pay | Admitting: Family Medicine

## 2019-01-03 VITALS — BP 138/90 | HR 62 | Temp 97.7°F | Ht 64.0 in | Wt 91.2 lb

## 2019-01-03 DIAGNOSIS — L989 Disorder of the skin and subcutaneous tissue, unspecified: Secondary | ICD-10-CM

## 2019-01-03 DIAGNOSIS — R631 Polydipsia: Secondary | ICD-10-CM

## 2019-01-03 DIAGNOSIS — Z9109 Other allergy status, other than to drugs and biological substances: Secondary | ICD-10-CM

## 2019-01-03 DIAGNOSIS — L309 Dermatitis, unspecified: Secondary | ICD-10-CM

## 2019-01-03 LAB — HEMOGLOBIN A1C: Hgb A1c MFr Bld: 5.4 % (ref 4.6–6.5)

## 2019-01-03 MED ORDER — TRIAMCINOLONE ACETONIDE 0.1 % EX CREA: 1.0000 "application " | TOPICAL_CREAM | Freq: Two times a day (BID) | CUTANEOUS | 0 refills | Status: DC

## 2019-01-03 MED ORDER — FLUTICASONE PROPIONATE 50 MCG/ACT NA SUSP: 2.0000 | Freq: Every day | NASAL | 2 refills | Status: DC

## 2019-01-03 NOTE — Patient Instructions (Addendum)
Zaditor eye drops could be helpful for itchy eyes.   Consider Vick's on your chest.   Flonase (fluticasone); nasal spray that is over the counter. 2 sprays each nostril, once daily. Aim towards the same side eye when you spray.  This is available OTC, and the generic versions, which may be cheaper, are in parentheses. Show this to a pharmacist if you have trouble finding any of these items.  Call your skin doctor about the lesion on your leg.    OK to lift. Be very careful.   Let us know if you need anything.

## 2019-01-03 NOTE — Progress Notes (Signed)
Cardiology Office Note   Date:  01/07/2019   ID:  Samantha Gregory, [place] 06-25-1935, MRN [id number]  PCP:  Samantha Pal, DO    No chief complaint on file.  Orthostatic hypotension  Wt Readings from Last 3 Encounters:  01/07/19 91 lb 3.2 oz (41.4 kg)  01/03/19 91 lb 4 oz (41.4 kg)  12/05/18 89 lb (40.4 kg)       History of Present Illness: Samantha Gregory is a 84 y.o. female   Who has a history of orthostatic hypotension. This has improved with liberalization of salt and fluid intake. She saw Dr. Mare Gregory in the past.   In 2001 she had a questionable TIA and had an echocardiogram with bubble study which did not show any embolic source. She has had previous carotid Dopplers which are normal, per Dr. Mare Gregory records. The patient is no longer on statins. She has tried most of the statins. She had to go off the statins because it was causing her to have decreased memory. Her HDL level was satisfactory and her risk ratio was though to be low.   She was intolerant of losartan and seen in the PharmD HTN clinic. Rash did not improve off of losartan. BP was at goal off of losartan.  SHe has had palpitations when stressed. In 3/18, she had dizziness with the ENT office visit. Echo at that time was stable. HR of 120 in the ENT office in 3/18.I suspect it may have been ATach.  MRI of head done in 3/19: 1. No acute intracranial abnormality. 2. Moderate chronic small vessel ischemic disease. 3. Chronically enlarged extra-axial spaces over both frontal convexities, stable to slightly larger than in 2011. 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  Denies : Chest pain. Dizziness. Leg edema. Nitroglycerin use. Orthopnea. Paroxysmal nocturnal dyspnea. Shortness of breath. Syncope.   Occasional palpitations, worse when she lies on her left side.  Occasional metoprolol.  She has not been  exercising, except for yard work. She fell in 11/19 and had a vertebral fracture.  She was told to rest.    Past Medical History:  Diagnosis Date  . Essential hypertension   . Heart palpitations   . Hypercholesterolemia   . Mitral regurgitation   . Orthostatic hypotension   . Paroxysmal atrial tachycardia ([place])   . PVC's (premature ventricular contractions)   . Squamous cell carcinoma, leg, right   . Squamous cell carcinoma, leg, right     Past Surgical History:  Procedure Laterality Date  . APPENDECTOMY    . TEE WITHOUT CARDIOVERSION N/A 09/08/2015   Procedure: TRANSESOPHAGEAL ECHOCARDIOGRAM (TEE);  Surgeon: Samantha Perla, MD;  Location: [facility] ENDOSCOPY;  Service: Cardiovascular;  Laterality: N/A;     Current Outpatient Medications  Medication Sig Dispense Refill  . Calcium 500-100 MG-UNIT CHEW Chew 2 tablets by mouth daily. 60 tablet   . cholecalciferol (VITAMIN D) 1000 units tablet Take 2 tablets (2,000 Units total) by mouth daily.    . fluticasone (FLONASE) 50 MCG/ACT nasal spray Place 2 sprays into both nostrils daily. 16 g 2  . levocetirizine (XYZAL) 5 MG tablet Take 1 tablet (5 mg total) by mouth every evening. 30 tablet 2  . metoprolol tartrate (LOPRESSOR) 25 MG tablet Take 1 tablet (25 mg total) by mouth 2 (two) times daily as needed (Use as needed for rapid heart beat.). 60 tablet 9  . triamcinolone cream (KENALOG) 0.1 % Apply 1 application  topically 2 (two) times daily. 30 g 0   No current facility-administered medications for this visit.     Allergies:   Statins; Diazolidinyl urea [urea]; Gold sodium thiosulfate; and Isothiazolinone chloride    Social History:  The patient  reports that she has never smoked. She has never used smokeless tobacco. She reports current alcohol use. She reports that she does not use drugs.   Family History:  The patient's family history includes Heart attack in her father; Heart disease in her brother, sister, and sister; Hypertension  in her brother; Kidney disease in her mother; Stroke in her mother, paternal aunt, and paternal uncle.    ROS:  Please see the history of present illness.   Otherwise, review of systems are positive for back pain.   All other systems are reviewed and negative.    PHYSICAL EXAM: VS:  BP (!) 142/88   Pulse 71   Ht 5\' 4"  (1.626 m)   Wt 91 lb 3.2 oz (41.4 kg)   SpO2 99%   BMI 15.65 kg/m  , BMI Body mass index is 15.65 kg/m. GEN: Well nourished, well developed, in no acute distress  HEENT: normal  Neck: no JVD, carotid bruits, or masses Cardiac: RRR- premature beats; no murmurs, rubs, or gallops,no edema  Respiratory:  clear to auscultation bilaterally, normal work of breathing GI: soft, nontender, nondistended, + BS MS: no deformity or atrophy  Skin: warm and dry, no rash Neuro:  Strength and sensation are intact Psych: euthymic mood, full affect   EKG:   The ekg ordered in 5/19 demonstrates NSR, LVH   Recent Labs: 12/05/2018: ALT 14 12/11/2018: BUN 26; Creatinine, Ser 0.89; Potassium 4.0; Sodium 140   Lipid Panel    Component Value Date/Time   CHOL 221 (H) 11/14/2017 0745   TRIG 59 11/14/2017 0745   HDL 85 11/14/2017 0745   CHOLHDL 2.6 11/14/2017 0745   CHOLHDL 2.3 12/30/2015 0741   VLDL 17 12/30/2015 0741   LDLCALC 124 (H) 11/14/2017 0745   LDLDIRECT 117.0 02/05/2014 3976     Other studies Reviewed: Additional studies/ records that were reviewed today with results demonstrating: carotid Doppler from 7/19 reviewed. Mild stenosis bilaterally.   ASSESSMENT AND PLAN:  1. Orthostatic hypotension: Minimial sx.  SHe stays hydrated.  She is careful when changing positions.  2. AI: No CHF sx.  3. PAT: Sx controlled. COntinue prn metoprolol.  4. PVCs: No sx.  Noted on exam. Sx controlled.  5. She requests a cholesterol check.  HDL was high in the past. LDL has been in th e120s.     Current medicines are reviewed at length with the patient today.  The patient concerns  regarding her medicines were addressed.  The following changes have been made:  No change  Labs/ tests ordered today include: lipids No orders of the defined types were placed in this encounter.   Recommend 150 minutes/week of aerobic exercise Low fat, low carb, high fiber diet recommended  Disposition:   FU in 6 months   Signed, Larae Grooms, MD  01/07/2019 8:24 AM    [facility] [address], [place], [place]  [address] Phone: [phone]; Fax: [phone]

## 2019-01-03 NOTE — Progress Notes (Signed)
Chief Complaint  Patient presents with  . Follow-up    back    Subjective: Patient is a 84 y.o. female here for f/u back.  Back is much better, has not been lifting. Hoping she can get back to it.   Hx of environ allergies. Currently on Xyzal. Also has itchy skin. Was told she is allergic to molds and various foods. No hx of anaphylaxis. Has not been tested in quite some time.  Has certain lesions on legs that are itchy. There is also a lesion on her lower extremity that resembles some skin cancer she had in past.   Has been having increased thirst at night.  She does drink lots of water.  Feels that she pees a lot due to this as well.  ROS: Skin: +itching Endo: +increased thirst  Past Medical History:  Diagnosis Date  . Essential hypertension   . Heart palpitations   . Hypercholesterolemia   . Mitral regurgitation   . Orthostatic hypotension   . Paroxysmal atrial tachycardia ([place])   . PVC's (premature ventricular contractions)   . Squamous cell carcinoma, leg, right   . Squamous cell carcinoma, leg, right     Objective: BP 138/90 (BP Location: Left Arm, Patient Position: Sitting, Cuff Size: Normal)   Pulse 62   Temp 97.7 F (36.5 C) (Oral)   Ht 5\' 4"  (1.626 m)   Wt 91 lb 4 oz (41.4 kg)   SpO2 95%   BMI 15.66 kg/m  General: Awake, appears stated age HEENT: MMM, EOMi Heart: RRR, no LE edema Skin: Dry patches noted, there is also a dome shaped lesion with central excoriation on LLE MSK: No ttp midline or parasp msc, neg straight leg Neur; DTR's equal and symmetric, no cerebellar signs Lungs: CTAB, no rales, wheezes or rhonchi. No accessory muscle use Psych: Age appropriate judgment and insight, normal affect and mood  Assessment and Plan: Environmental allergies - Plan: Food Allergy Profile, Allergy Panel 11, Mold Group, fluticasone (FLONASE) 50 MCG/ACT nasal spray  Polydipsia - Plan: Hemoglobin A1c  Skin lesion  Dermatitis - Plan: triamcinolone cream  (KENALOG) 0.1 %  Ck labs, start INCS, avoid triggers. Ck a1c.  Needs to contact dermatologist for this, looks like skin cancer that she has had before. Trial cream. Cont at least twice daily emollients.  F/u 1 mo. The patient voiced understanding and agreement to the plan.  [place], DO 01/03/19  12:05 PM

## 2019-01-03 NOTE — Progress Notes (Signed)
Pre visit review using our clinic review tool, if applicable. No additional management support is needed unless otherwise documented below in the visit note. 

## 2019-01-04 ENCOUNTER — Telehealth: Payer: Self-pay | Admitting: Family Medicine

## 2019-01-04 LAB — FOOD ALLERGY PROFILE
Allergen, Salmon, f41: <0.1 kU/L
CLASS: 0
CLASS: 0
CLASS: 0
CLASS: 0
CLASS: 0
CLASS: 0
CLASS: 0
CLASS: 0
CLASS: 0
CLASS: 0
CLASS: 0
Cashew IgE: <0.1 kU/L
Class: 0
Class: 0
Class: 0
Class: 1
Hazelnut: <0.1 kU/L
Milk IgE: 0.37 kU/L — ABNORMAL HIGH
Scallop IgE: <0.1 kU/L
Shrimp IgE: <0.1 kU/L
Soybean IgE: <0.1 kU/L
Tuna IgE: <0.1 kU/L
Walnut: <0.1 kU/L
Wheat IgE: <0.1 kU/L

## 2019-01-04 LAB — ALLERGY PANEL 11, MOLD GROUP
Allergen, A. alternata, m6: <0.1 kU/L
Allergen, Mucor Racemosus, M4: <0.1 kU/L
Aspergillus fumigatus, m3: <0.1 kU/L
CLADOSPORIUM HERBARUM (M2) IGE: <0.1 kU/L
CLASS: 0
CLASS: 0
Candida Albicans: <0.1 kU/L
Class: 0
Class: 0
Class: 0

## 2019-01-04 LAB — INTERPRETATION:

## 2019-01-04 NOTE — Telephone Encounter (Signed)
Copied from [place] [id number]. Topic: General - Other >> Jan 04, 2019 12:18 PM Mcneil, Ja-Kwan wrote: Reason for CRM: Pt stated she would like to speak with Dr. Nani Ravens in regards to him removing the growth from her leg. Pt stated she does not want to go to a Dermatologist. Pt stated she would like to make Dr. Nani Ravens aware that she is able to move the growth around so it is not attached to the bone. Pt requests a call back. Cb# [phone]    Scheduled procedure 01/09/2019 at 4:15

## 2019-01-04 NOTE — Telephone Encounter (Signed)
The first step would be to remove it to verify our dx, please schedule her in procedure slot to do this. TY.

## 2019-01-07 ENCOUNTER — Encounter: Payer: Self-pay | Admitting: Interventional Cardiology

## 2019-01-07 ENCOUNTER — Ambulatory Visit: Payer: PPO | Admitting: Interventional Cardiology

## 2019-01-07 VITALS — BP 142/88 | HR 71 | Ht 64.0 in | Wt 91.2 lb

## 2019-01-07 DIAGNOSIS — I471 Supraventricular tachycardia: Secondary | ICD-10-CM | POA: Diagnosis not present

## 2019-01-07 DIAGNOSIS — I351 Nonrheumatic aortic (valve) insufficiency: Secondary | ICD-10-CM

## 2019-01-07 DIAGNOSIS — I951 Orthostatic hypotension: Secondary | ICD-10-CM | POA: Diagnosis not present

## 2019-01-07 DIAGNOSIS — I493 Ventricular premature depolarization: Secondary | ICD-10-CM

## 2019-01-07 NOTE — Patient Instructions (Signed)
Medication Instructions:  Your physician recommends that you continue on your current medications as directed. Please refer to the Current Medication list given to you today.  If you need a refill on your cardiac medications before your next appointment, please call your pharmacy.   Lab work: Your physician recommends that you return for a FASTING lipid profile  If you have labs (blood work) drawn today and your tests are completely normal, you will receive your results only by: Marland Kitchen MyChart Message (if you have MyChart) OR . A paper copy in the mail If you have any lab test that is abnormal or we need to change your treatment, we will call you to review the results.  Testing/Procedures: None ordered  Follow-Up: At [facility], you and your health needs are our priority.  As part of our continuing mission to provide you with exceptional heart care, we have created designated Provider Care Teams.  These Care Teams include your primary Cardiologist (physician) and Advanced Practice Providers (APPs -  Physician Assistants and Nurse Practitioners) who all work together to provide you with the care you need, when you need it. . You will need a follow up appointment in 6 months.  Please call our office 2 months in advance to schedule this appointment.  You may see Casandra Doffing, MD or one of the following Advanced Practice Providers on your designated Care Team:   . Lyda Jester, PA-C . Dayna Dunn, PA-C . Ermalinda Barrios, PA-C  Any Other Special Instructions Will Be Listed Below (If Applicable).

## 2019-01-09 ENCOUNTER — Encounter: Payer: Self-pay | Admitting: Family Medicine

## 2019-01-09 ENCOUNTER — Ambulatory Visit (INDEPENDENT_AMBULATORY_CARE_PROVIDER_SITE_OTHER): Payer: PPO | Admitting: Family Medicine

## 2019-01-09 DIAGNOSIS — D489 Neoplasm of uncertain behavior, unspecified: Secondary | ICD-10-CM

## 2019-01-09 DIAGNOSIS — C44729 Squamous cell carcinoma of skin of left lower limb, including hip: Secondary | ICD-10-CM | POA: Diagnosis not present

## 2019-01-09 NOTE — Progress Notes (Signed)
Chief Complaint  Patient presents with  . Procedure    Samantha Gregory is a 84 y.o. female here for a skin complaint.  She brought up a skin complaint she had at her last visit.  I recommended she see her dermatologist.  After the visit, she wished for me to take care of this.  She is here for a removal today.  ROS:  Const: No fevers Skin: As noted in HPI  Past Medical History:  Diagnosis Date  . Essential hypertension   . Heart palpitations   . Hypercholesterolemia   . Mitral regurgitation   . Orthostatic hypotension   . Paroxysmal atrial tachycardia ([place])   . PVC's (premature ventricular contractions)   . Squamous cell carcinoma, leg, right   . Squamous cell carcinoma, leg, right    Gen: awake, alert, appearing stated age Lungs: No accessory muscle use Skin: See below. 0.7 x 0.4 cm. No drainage, erythema, TTP, fluctuance, excoriation Psych: Age appropriate judgment and insight   LLE  Procedure note; shave biopsy Informed consent was obtained. The area was cleaned with alcohol and injected with 0.5 mL of 1% lidocaine with epinephrine. A Dermablade was slightly bent and used to cut under the area of interest. The specimen was placed in a sterile specimen cup and sent to the lab. The area was then cauterized ensuring adequate hemostasis. The area was dressed with triple antibiotic ointment and a bandage. There were no complications noted. The patient tolerated the procedure well.  Neoplasm of uncertain behavior - Plan: Dermatology pathology([place]), PR SHAV SKIN LES 0.6-1.0 CM TRUNK,ARM,LEG  Orders as above. Aftercare instructions verbalized and written down. F/u pending results, may need to E&C x 3 if cancerous. The patient voiced understanding and agreement to the plan.  [place], DO 01/09/19 4:51 PM

## 2019-01-09 NOTE — Patient Instructions (Signed)
Do not shower for the rest of the day. When you do wash it, use only soap and water. Do not vigorously scrub. Apply triple antibiotic ointment (like Neosporin) twice daily. Keep the area clean and dry.   Things to look out for: increasing pain not relieved by ibuprofen/acetaminophen, fevers, spreading redness, drainage of pus, or foul odor.  We will be in touch regarding your results and if we need to go further.  Let us know if you need anything.

## 2019-01-14 ENCOUNTER — Other Ambulatory Visit: Payer: PPO | Admitting: *Deleted

## 2019-01-14 DIAGNOSIS — I351 Nonrheumatic aortic (valve) insufficiency: Secondary | ICD-10-CM

## 2019-01-14 DIAGNOSIS — I493 Ventricular premature depolarization: Secondary | ICD-10-CM | POA: Diagnosis not present

## 2019-01-14 DIAGNOSIS — I471 Supraventricular tachycardia: Secondary | ICD-10-CM

## 2019-01-14 DIAGNOSIS — I951 Orthostatic hypotension: Secondary | ICD-10-CM

## 2019-01-14 LAB — LIPID PANEL
Chol/HDL Ratio: 2.6 ratio (ref 0.0–4.4)
Cholesterol, Total: 253 mg/dL — ABNORMAL HIGH (ref 100–199)
HDL: 96 mg/dL (ref >39)
LDL CALC: 138 mg/dL — AB (ref 0–99)
Triglycerides: 95 mg/dL (ref 0–149)
VLDL Cholesterol Cal: 19 mg/dL (ref 5–40)

## 2019-01-16 ENCOUNTER — Telehealth: Payer: Self-pay | Admitting: *Deleted

## 2019-01-16 ENCOUNTER — Ambulatory Visit: Payer: PPO | Admitting: Family Medicine

## 2019-01-16 ENCOUNTER — Telehealth: Payer: Self-pay | Admitting: Family Medicine

## 2019-01-16 NOTE — Telephone Encounter (Signed)
Copied from [place] [id number]. Topic: Appointment Scheduling - Scheduling Inquiry for Clinic >> Jan 15, 2019  8:57 AM Samantha Gregory wrote: Reason for CRM: Patient called to move her appt for 1/29. I offered her 1/30 and she say she would like something before 1/29 and before 1pm. I offered her for tomorrow and she said she was not sure if the supplies were in for the appt. Please Advise >> Jan 16, 2019  8:52 AM Samantha Gregory wrote: Pt calling back to speak with Samantha Gregory about an appt    Checked with Samantha Gregory and supplies are not in yet Rescheduled the patients procedure for 01/21/2019 at 11:15. Patient is aware and knows this could change if supplies do not come in.

## 2019-01-16 NOTE — Telephone Encounter (Signed)
Received Dermatopathology Report results from [facility]; forwarded to provider/SLS 01/22

## 2019-01-21 ENCOUNTER — Encounter: Payer: Self-pay | Admitting: Family Medicine

## 2019-01-21 ENCOUNTER — Ambulatory Visit (INDEPENDENT_AMBULATORY_CARE_PROVIDER_SITE_OTHER): Payer: PPO | Admitting: Family Medicine

## 2019-01-21 DIAGNOSIS — C44729 Squamous cell carcinoma of skin of left lower limb, including hip: Secondary | ICD-10-CM | POA: Diagnosis not present

## 2019-01-21 NOTE — Progress Notes (Signed)
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  She did not wish to follow-up with her dermatologist for this issue.  We are taking care of her today.  Gen-weight, alert Skin-there is some mildly hyperpigmented and excoriated skin on the left lower extremity with a shave biopsy took place Psych-appropriate judgment and insight  Procedure note: Curette and electrodesiccation Informed consent obtained The area was anesthetized with 2.5 mL of 1% lidocaine with epinephrine Initial area was cauterized with the fulgurator and then scraped with a metal curette. This process was repeated 2 times. The area was then cauterized to control bleeding. Triple antibiotic ointment and a Band-Aid were placed. The patient tolerated the procedure well. There were no immediate complications noted.  Squamous cell carcinoma of lower leg, left - Plan: PR DESTR MALIG TRUNK,EXTREM 0.6-1 CM  Aftercare instructions verbalized and written down.  I did tell her there is 1.7% chance of recurrence so if she does experience anything at this, she is to let us know. Follow-up as needed. The patient voiced understanding and agreement to the plan.  Crosby Oyster [name] 11:41 AM 01/21/19

## 2019-01-21 NOTE — Patient Instructions (Signed)
Do not shower for the rest of the day. When you do wash it, use only soap and water. Do not vigorously scrub. Apply triple antibiotic ointment (like Neosporin) twice daily. Keep the area clean and dry.   Things to look out for: increasing pain not relieved by ibuprofen/acetaminophen, fevers, spreading redness, drainage of pus, or foul odor.  There is a 1.7% chance the skin cancer recurs so if it returns, let us know.  Let us know if you need anything.

## 2019-01-23 ENCOUNTER — Ambulatory Visit: Payer: PPO | Admitting: Family Medicine

## 2019-01-28 ENCOUNTER — Telehealth: Payer: Self-pay | Admitting: Interventional Cardiology

## 2019-01-28 ENCOUNTER — Ambulatory Visit: Payer: Self-pay

## 2019-01-28 NOTE — Telephone Encounter (Signed)
The patient had labs done at her Cardiology office.  They did try to contact her but due to her VM full/and no answer she was uncertain what she needed to do to improve her numbers. She would like if ok for Dr. Nani Ravens to take a look at her results/advise please.

## 2019-01-28 NOTE — Telephone Encounter (Signed)
Called the patient informed of PCP instructions. She did verbalize understanding.

## 2019-01-28 NOTE — Telephone Encounter (Signed)
Attempted to contact patient but there was no answer and VM was full. ?

## 2019-01-28 NOTE — Telephone Encounter (Signed)
New message   Patient has questions about her lab results. Please contact the patient.

## 2019-01-28 NOTE — Telephone Encounter (Signed)
Per pt's initial request, "Pt called, she would like to have call back from nurse to see what can be done about her cholesterol. Please call back at [phone]"; attempted to contact pt; pre-recorded message states that the voicemail is full; pt last seen by Dr Nani Ravens, [facility], on 01/09/2019;will route to office for final disposition.

## 2019-01-28 NOTE — Telephone Encounter (Signed)
Her good cholesterol is high, which is good. No changes recommended. Keep the diet clean and stay active. TY.

## 2019-01-28 NOTE — Telephone Encounter (Signed)
Called the patient and no answer//and mailbox was full.

## 2019-01-30 ENCOUNTER — Telehealth: Payer: Self-pay | Admitting: Family Medicine

## 2019-01-30 NOTE — Telephone Encounter (Signed)
Pt came in office stating that lately is feeling a bit dizzy, pt wants to know if it has to do with the medication that she has been on for her allergies (fluticasone (FLONASE) 50 MCG/ACT nasal spray)  Please advise ASAP. PT tel [phone].

## 2019-01-30 NOTE — Telephone Encounter (Signed)
-----   Message from Jettie Booze, MD sent at 01/16/2019 10:18 AM EST ----- Very high HDL.  COntinue healthy diet and exercise.

## 2019-01-30 NOTE — Telephone Encounter (Signed)
Called informed the patient of PCP instructions. She used too much flonase night before last. Has had no more since Monday night. Yesterday morning she could hardly get out of bed due to being dizzy. Finally got out of bed and crawled on her hands/knees to the bathroom//fell standing/going to the bathroom. She has had no more flonase since Monday night and today is doing better. Yesterday by evening was better. 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 She thinks allergy medication (taking too much both times) has made her feel this way.

## 2019-01-30 NOTE — Telephone Encounter (Signed)
The patient has been notified of the result and verbalized understanding.  All questions (if any) were answered. Cleon Gustin, RN 01/30/2019 9:51 AM

## 2019-01-30 NOTE — Telephone Encounter (Signed)
Patient made aware of instructions.

## 2019-01-30 NOTE — Telephone Encounter (Signed)
If she is getting better, we don't necessarily need to evaluate her. If she fails to improve or worsens, will need to be seen. TY.

## 2019-01-30 NOTE — Telephone Encounter (Signed)
I wouldn't think so. If it is mild, can stop and see how she does or we can see her today/tomorrow if needed. TY.

## 2019-02-04 ENCOUNTER — Encounter: Payer: Self-pay | Admitting: Family Medicine

## 2019-02-04 ENCOUNTER — Ambulatory Visit (INDEPENDENT_AMBULATORY_CARE_PROVIDER_SITE_OTHER): Payer: PPO | Admitting: Family Medicine

## 2019-02-04 VITALS — BP 142/80 | HR 64 | Temp 97.7°F | Ht 64.0 in | Wt 92.2 lb

## 2019-02-04 DIAGNOSIS — Z9109 Other allergy status, other than to drugs and biological substances: Secondary | ICD-10-CM

## 2019-02-04 DIAGNOSIS — K5900 Constipation, unspecified: Secondary | ICD-10-CM | POA: Diagnosis not present

## 2019-02-04 DIAGNOSIS — J309 Allergic rhinitis, unspecified: Secondary | ICD-10-CM | POA: Diagnosis not present

## 2019-02-04 MED ORDER — FEXOFENADINE HCL 60 MG PO TABS: 60.0000 mg | ORAL_TABLET | Freq: Two times a day (BID) | ORAL | 0 refills | Status: DC

## 2019-02-04 NOTE — Progress Notes (Signed)
Chief Complaint  Patient presents with  . Follow-up    Subjective: Patient is a 84 y.o. female here for f/u.  Head E&C around 1 week ago.  That area is doing well.  History of allergies.  Felt "not myself" when using the Flonase and believes she took too much.  She does not wish to take this medicine any longer.  She had a basic allergy blood panel done that showed a modest reaction to milk.  Taking Allegra but feels the dose is too high.  The capsules are too much and she is unable to cut them in half.  She is preferring the tabs.  She was told to increase her calcium supplement.  Since that time, she has been more constipated.  ROS: Allergic: As noted in HPI Skin: No new skin lesions  Past Medical History:  Diagnosis Date  . Essential hypertension   . Heart palpitations   . Hypercholesterolemia   . Mitral regurgitation   . Orthostatic hypotension   . Paroxysmal atrial tachycardia ([place])   . PVC's (premature ventricular contractions)   . Squamous cell carcinoma, leg, right   . Squamous cell carcinoma, leg, right     Objective: BP (!) 142/80 (BP Location: Left Arm, Patient Position: Sitting, Cuff Size: Normal)   Pulse 64   Temp 97.7 F (36.5 C) (Oral)   Ht 5\' 4"  (1.626 m)   Wt 92 lb 4 oz (41.8 kg)   SpO2 94%   BMI 15.83 kg/m  General: Awake, appears stated age HEENT: MMM, EOMi, nares patent without discharge Skin: There is a small bit of eschar over the left anterior tibial region. Heart: RRR, no LE edema Lungs: CTAB, no rales, wheezes or rhonchi. No accessory muscle use Psych: Age appropriate judgment and insight, normal affect and mood  Assessment and Plan: Environmental allergies - Plan: fexofenadine (ALLEGRA) 60 MG tablet  Constipation, unspecified constipation type  Try tablet.  Okay to stop intranasal corticosteroid. She was having some issues with constipation.  Recommended increasing hydration and fiber status.  MiraLAX also recommended.  Cut down on  calcium supplement. The patient voiced understanding and agreement to the plan.  [place], DO 02/04/19  11:54 AM

## 2019-02-04 NOTE — Patient Instructions (Addendum)
Try melatonin to help sleep. Aspirin will not help.  Go back to 1 tab of your calcium supplement to see if it helps with your bowel movements.   Try MiraLAX 1-2 times daily over the next 3-4 days. If no improvement, try using an enema. Stay well hydrated and keep lots of fiber in your diet.  You have a mild allergy to milk/dairy. You are OK to eat peanut butter.  Let us know if you need anything.

## 2019-02-04 NOTE — Progress Notes (Signed)
Pre visit review using our clinic review tool, if applicable. No additional management support is needed unless otherwise documented below in the visit note. 

## 2019-03-04 ENCOUNTER — Encounter: Payer: Self-pay | Admitting: Family Medicine

## 2019-03-04 ENCOUNTER — Ambulatory Visit (INDEPENDENT_AMBULATORY_CARE_PROVIDER_SITE_OTHER): Payer: PPO | Admitting: Family Medicine

## 2019-03-04 VITALS — BP 120/72 | HR 102 | Temp 97.6°F | Ht 65.0 in | Wt 91.1 lb

## 2019-03-04 DIAGNOSIS — M545 Low back pain, unspecified: Secondary | ICD-10-CM

## 2019-03-04 MED ORDER — PREDNISONE 20 MG PO TABS: 40.0000 mg | ORAL_TABLET | Freq: Every day | ORAL | 0 refills | Status: AC

## 2019-03-04 NOTE — Progress Notes (Signed)
Musculoskeletal Exam  Patient: Samantha Gregory DOB: [birth year]  DOS: 03/04/2019  SUBJECTIVE:  Chief Complaint: .  Chief Complaint  Patient presents with  . Hip Pain  . Back Pain    Samantha Gregory is a 84 y.o.  female for evaluation and treatment of hip/back pain.   Onset:  1 week ago. Was lifting heavy things to help empty a house. Items up to 50 lbs.  Location: low back/hip area Character:  sharp  Progression of issue:  is unchanged Associated symptoms: decreased ROM Treatment: to date has been activity modification and heat.   Neurovascular symptoms: no  ROS: Musculoskeletal/Extremities: +back/hip pain  Past Medical History:  Diagnosis Date  . Essential hypertension   . Heart palpitations   . Hypercholesterolemia   . Mitral regurgitation   . Orthostatic hypotension   . Paroxysmal atrial tachycardia ([place])   . PVC's (premature ventricular contractions)   . Squamous cell carcinoma, leg, right   . Squamous cell carcinoma, leg, right     Objective: VITAL SIGNS: BP 120/72 (BP Location: Left Arm, Patient Position: Sitting, Cuff Size: Normal)   Pulse (!) 102   Temp 97.6 F (36.4 C) (Oral)   Ht 5\' 5"  (1.651 m)   Wt 91 lb 2 oz (41.3 kg)   SpO2 94%   BMI 15.16 kg/m  Constitutional: Well formed, well developed. No acute distress. Thorax & Lungs: No accessory muscle use Musculoskeletal: low back.   Tenderness to palpation: yes, over b/l SI jt, worse on L Deformity: no Ecchymosis: no Tests positive: none Tests negative: strt leg, [place], FABER, FADDIR, log roll Neurologic: Normal sensory function. No focal deficits noted. Psychiatric: Normal mood. Age appropriate judgment and insight. Alert & oriented x 3.    Assessment:  Acute bilateral low back pain without sciatica - Plan: predniSONE (DELTASONE) 20 MG tablet  Plan: SI joint pain. Pred burst. Tylenol. Ice. Heat.  F/u in 1 week if no better- will consider injection. The patient voiced  understanding and agreement to the plan.   [place], DO 03/04/19  1:55 PM

## 2019-03-04 NOTE — Patient Instructions (Addendum)
I recommend you sell the house as is so you don't put yourself in physical straits anymore.   Ice/cold pack over area for 10-15 min twice daily.  Heat (pad or rice pillow in microwave) over affected area, 10-15 minutes twice daily.   OK to take Tylenol 1000 mg (2 extra strength tabs) or 975 mg (3 regular strength tabs) every 6 hours as needed.  Let us know if you need anything.

## 2019-03-06 ENCOUNTER — Other Ambulatory Visit: Payer: Self-pay | Admitting: Family Medicine

## 2019-03-06 DIAGNOSIS — Z9109 Other allergy status, other than to drugs and biological substances: Secondary | ICD-10-CM

## 2019-03-11 ENCOUNTER — Telehealth: Payer: Self-pay | Admitting: Family Medicine

## 2019-03-11 MED ORDER — MELOXICAM 7.5 MG PO TABS: 7.5000 mg | ORAL_TABLET | Freq: Every day | ORAL | 0 refills | Status: DC

## 2019-03-11 NOTE — Telephone Encounter (Signed)
I have sent in a low dose daily anti-inflammatory to take. Also use Tylenol to help. TY.

## 2019-03-11 NOTE — Telephone Encounter (Signed)
Called the patient informed of PCP's response She verbalized understanding.

## 2019-03-11 NOTE — Telephone Encounter (Signed)
Copied from [place] [id number]. Topic: Quick Communication - See Telephone Encounter >> Mar 11, 2019  8:57 AM Burchel, Abbi R wrote: CRM for notification. See Telephone encounter for: 03/11/19.  Pt states she saw Dr Nani Ravens for severe hip pain 1 wk ago and was prescribed Prednisone.  Pt states she has finished all of the prednisone, but is still having pain when transitioning from bed to chair and chair to standing. Pt would prefer not to have to come back in to the office for another visit bc of exposure risks. Pt states she needs to know what to take for continued pain.  Please call pt to advise.

## 2019-04-01 ENCOUNTER — Telehealth: Payer: Self-pay | Admitting: Family Medicine

## 2019-04-01 NOTE — Telephone Encounter (Signed)
Pt. left voice message re: questions about right back pain and lower back pain.  Returned call to pt.  She reported that she has been cleaning her garage and cleaning closets, as well as pruning shrubs and plants.  Stated when she tried to pick up a pile of branches she had pruned, she felt increased pain in her lower back and right hip.  Stated she had taken Meloxicam when she had left hip pain for about 8 days, then stopped when the pain improved.  Stated she plans to stop working now, and put heat on the right hip.  Stated she will resume taking Meloxicam in the morning.  She wanted to make Dr. Nani Ravens aware of her call.  Reviewed her last office note, and reiterated Dr. Irene Limbo recommendation of applying ice/ cold pack alternating with heat for 10-15 min. Intervals, and Tylenol 1000 mg. q 6 hrs/ prn.  Pt. Verb. Understanding.  Advised will send message to Dr. Nani Ravens.  Agreed with plan.

## 2019-04-01 NOTE — Telephone Encounter (Signed)
Noted  

## 2019-04-03 ENCOUNTER — Other Ambulatory Visit: Payer: Self-pay | Admitting: Family Medicine

## 2019-04-26 MED ORDER — ACETAMINOPHEN ER 650 MG PO TBCR: 650.0000 mg | EXTENDED_RELEASE_TABLET | Freq: Two times a day (BID) | ORAL | 3 refills | Status: DC

## 2019-04-26 NOTE — Telephone Encounter (Signed)
Patient informed. 

## 2019-04-26 NOTE — Telephone Encounter (Signed)
If she is not interested in meloxicam, I would recommend twice daily Tylenol 650 mg (2 regular strength tabs). Ty.

## 2019-04-26 NOTE — Addendum Note (Signed)
Addended by: Sharon Seller B on: 04/26/2019 09:36 AM   Modules accepted: Orders

## 2019-04-26 NOTE — Telephone Encounter (Signed)
Called to inquire how she is doing. Taking the meloxicam daily since 04/01/2019. As of today hip pain is doing better. ??? Taking Meloxicam daily for one month---BUT she thinks she needs a low dose of something for pain, but something mild enough she can still function/// Wants to stop the meloxicam for now Having some problems with right leg/below knee//having pain She has no tylenol she has nothing OTC in her home currently to take

## 2019-04-27 ENCOUNTER — Other Ambulatory Visit: Payer: Self-pay | Admitting: Family Medicine

## 2019-05-15 ENCOUNTER — Other Ambulatory Visit: Payer: Self-pay | Admitting: Family Medicine

## 2019-05-15 MED ORDER — MELOXICAM 7.5 MG PO TABS: 7.5000 mg | ORAL_TABLET | Freq: Every day | ORAL | 1 refills | Status: DC

## 2019-05-15 NOTE — Telephone Encounter (Signed)
Copied from [place] [id number]. Topic: Quick Communication - Rx Refill/Question >> May 15, 2019  8:13 AM Richardo Priest, NT wrote: Medication:  meloxicam (MOBIC) 7.5 MG tablet  Has the patient contacted their pharmacy? Patient states no more refills. Patient states she needs them today due to taking last pill.   Preferred Pharmacy (with phone number or street name):  [id number] - [place], [place] - [address] [phone] (Phone) [phone] (Fax)  Agent: Please be advised that RX refills may take up to 3 business days. We ask that you follow-up with your pharmacy.

## 2019-05-15 NOTE — Telephone Encounter (Signed)
Med has been refilled at pharmacy 

## 2019-05-21 ENCOUNTER — Telehealth: Payer: Self-pay

## 2019-05-21 NOTE — Telephone Encounter (Signed)
Patient informed of PCP instructions. She had no further questions or concerns.

## 2019-05-21 NOTE — Telephone Encounter (Signed)
Called the patient back//no answer////mail box was full.

## 2019-05-21 NOTE — Telephone Encounter (Signed)
She can take Tylenol and Mobic together safely. Ty.

## 2019-05-21 NOTE — Telephone Encounter (Signed)
Stop meloxicam and take Tylenol twice daily to see how she does.
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
## 2019-05-21 NOTE — Telephone Encounter (Signed)
Copied from [place] [id number]. Topic: General - Call Back - No Documentation >> May 17, 2019  3:47 PM Erick Blinks wrote: Reason for CRM: Pt is taking meloxicam (MOBIC) 7.5 MG tablet but also has received pain relief medication that could possibly conflict. ACETAMINOPHEN. Made aware that it is Tylenol, no aspirin or ibuprofen. Please advise at earliest convenience, she has questions for PCP.

## 2019-05-23 IMAGING — DX DG RIBS 2V*L*
2 series · 2 of 2 positions shown · non-contrast
Comparison: Chest x-ray 05/20/2016

CLINICAL DATA: Fall 2 days ago with left rib pain.

EXAM:
LEFT RIBS - 2 VIEW

[rib ap]
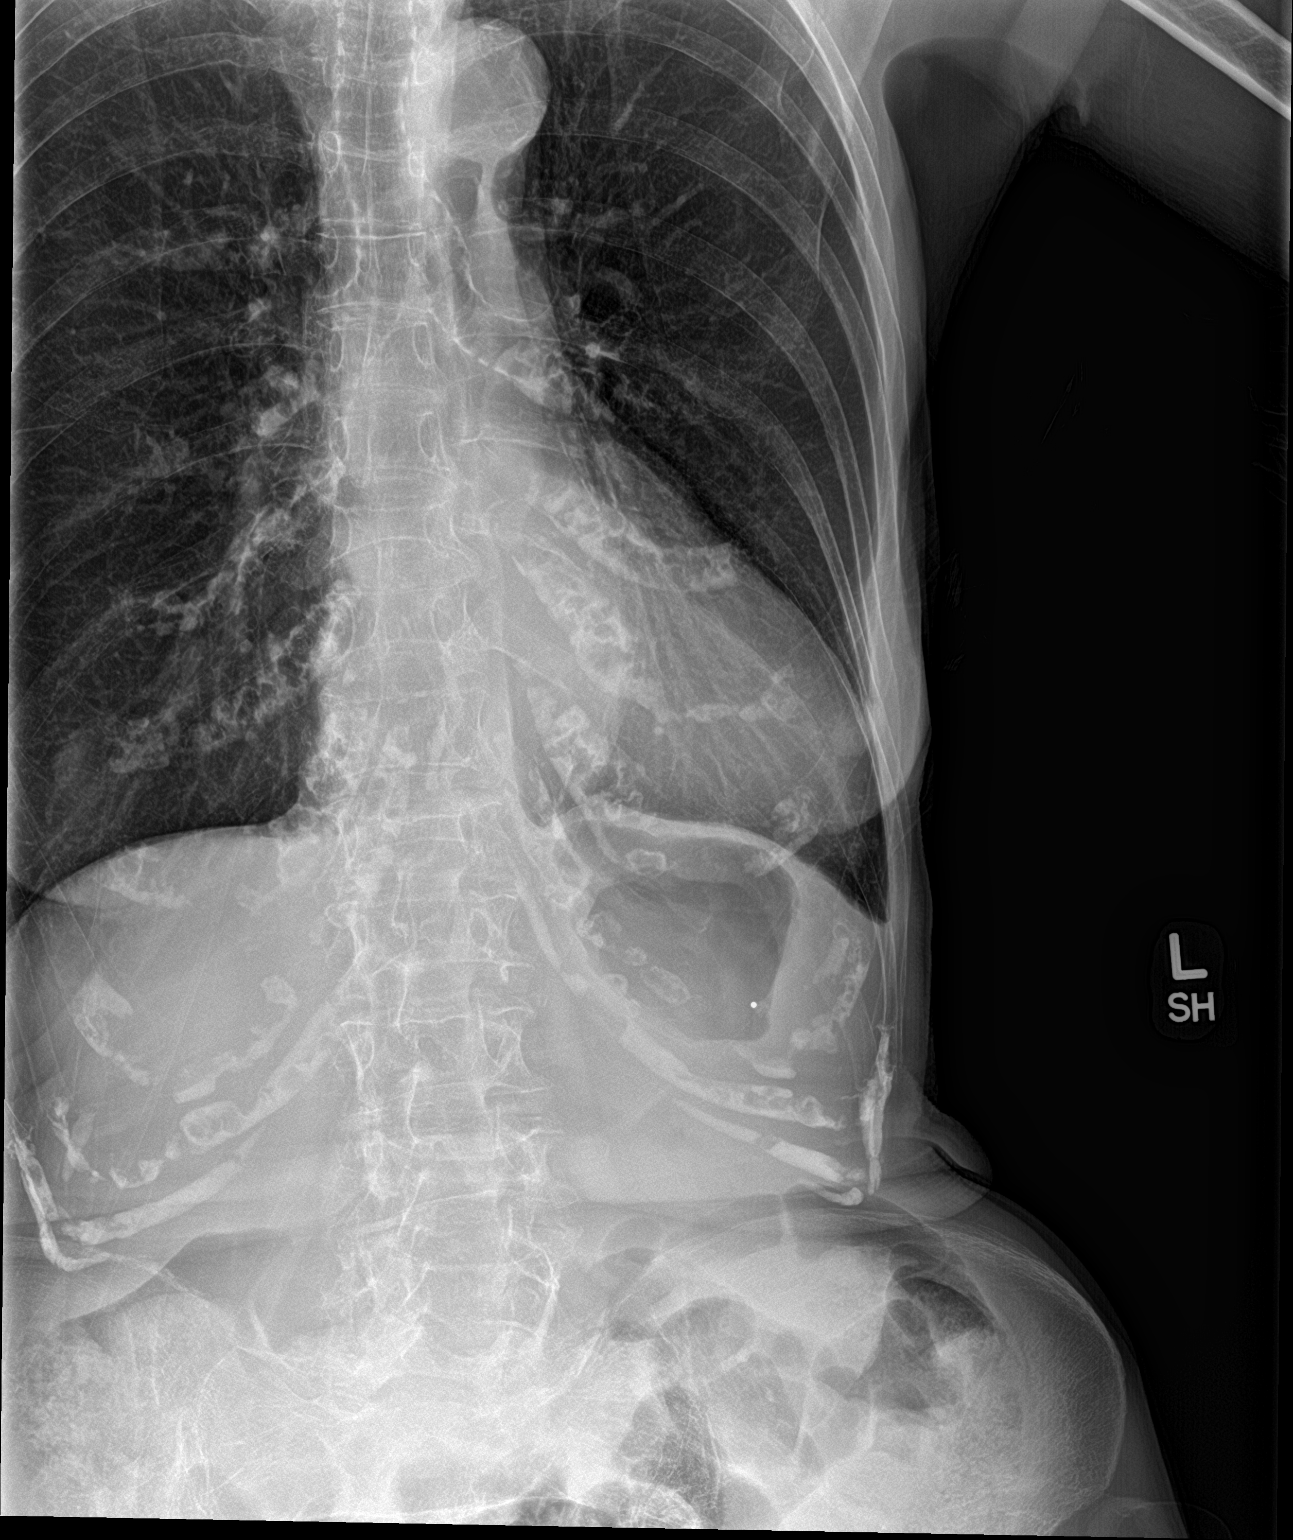

[rib ap obl]
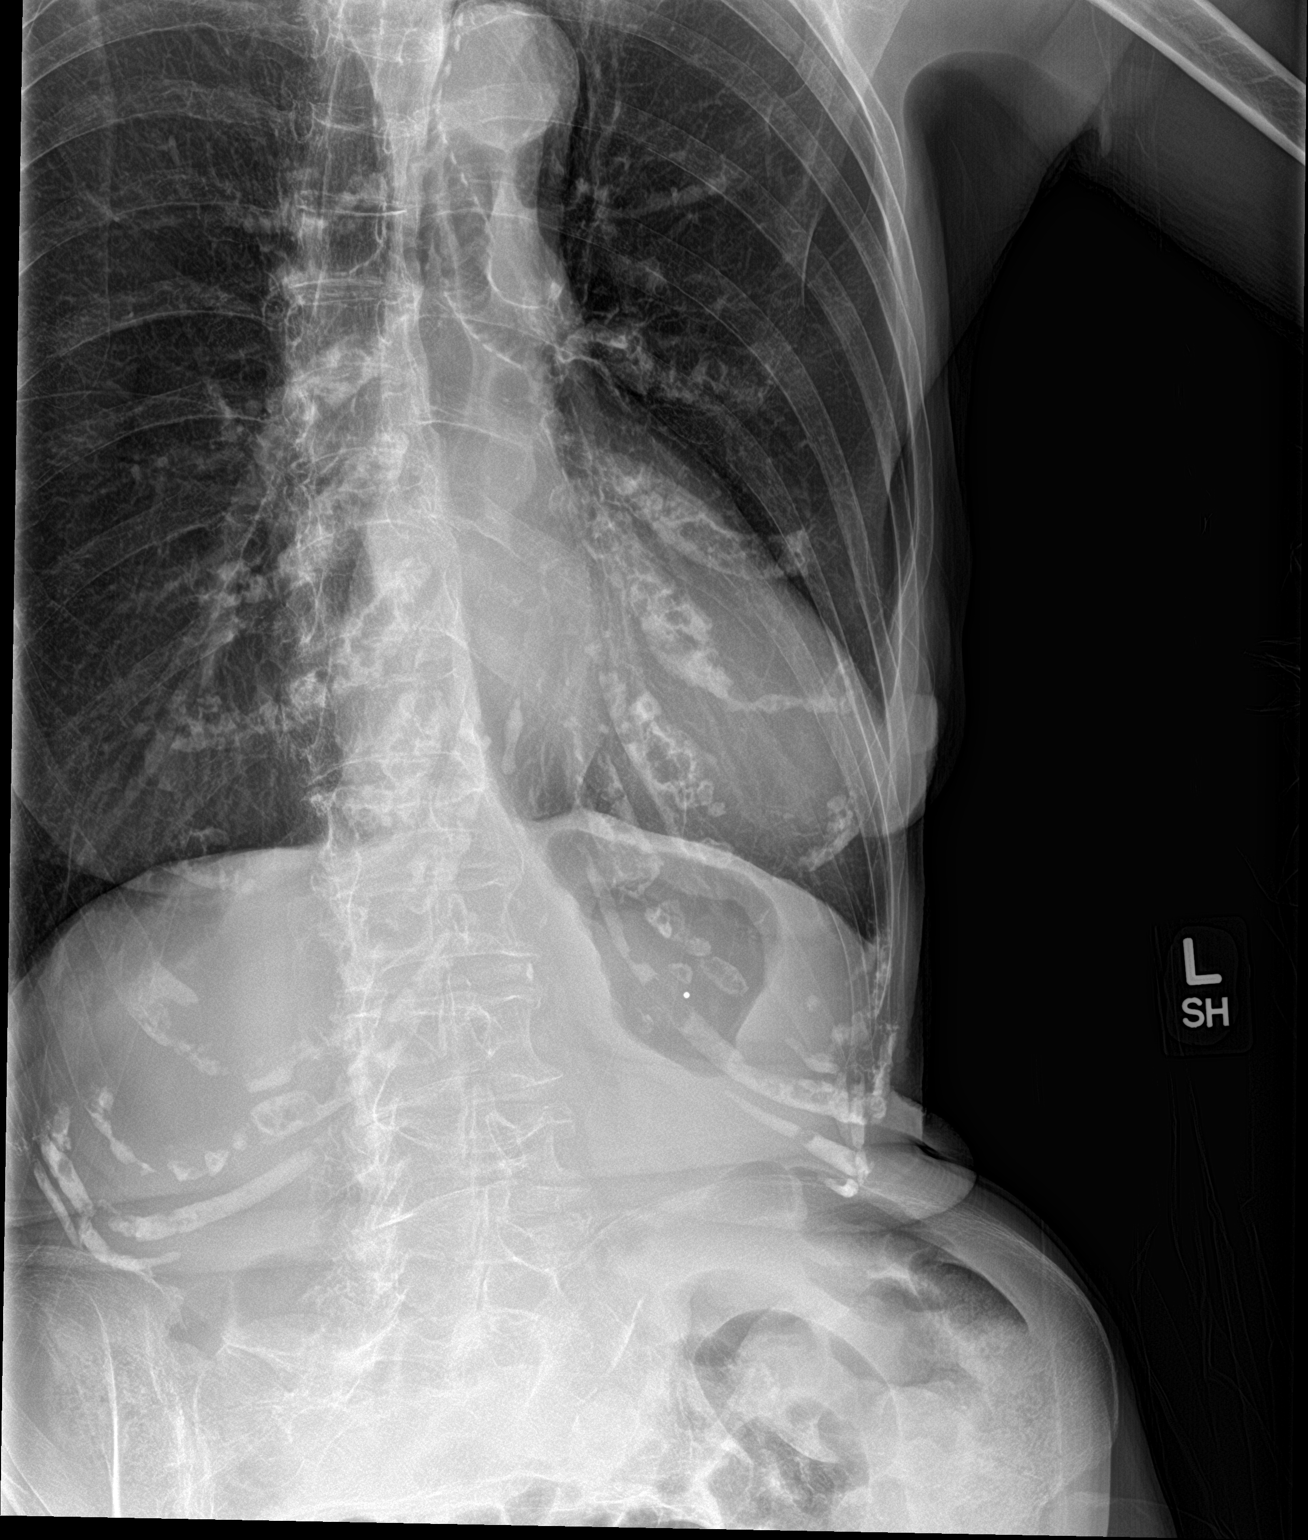

[2 of 2 positions shown; findings below may reference images not displayed]

FINDINGS: Two views of the symptomatic ribs marked by a palpable marker show
no fracture. No visible pneumothorax and no pleural fluid. There are
multiple compression fractures of the lower thoracic and lumbar
spine, not well evaluated on this study.
IMPRESSION: 1. No visible rib fracture.
2. Numerous thoracic and lumbar compression fractures

## 2019-05-30 ENCOUNTER — Telehealth: Payer: Self-pay | Admitting: *Deleted

## 2019-05-30 NOTE — Telephone Encounter (Signed)
Copied from [place] [id number]. Topic: General - Other >> May 30, 2019  4:09 PM Celene Kras A wrote: Reason for CRM: Pt called stating she removed a tick from her leg and applied alcohol to the site. Please advise with next steps for pt.

## 2019-05-31 NOTE — Telephone Encounter (Signed)
Spoke to the patient and spot still red where tick was removed. PCP recommended a face to face appt. The patient refused in office appt.due to fear of virus...offered Virtual Visit but patient has only a flip phone and no computer. PCP advised to watch for fevers, increased redness andpain----if happens to schedule a face to face. Keep clean and apply antibiotic cream The patient voiced understanding  Of all information

## 2019-05-31 NOTE — Telephone Encounter (Signed)
If redness comes, make an appt. If not, antibiotic ointment for a week. Ty.

## 2019-06-20 ENCOUNTER — Other Ambulatory Visit: Payer: Self-pay

## 2019-06-20 ENCOUNTER — Ambulatory Visit: Payer: Self-pay | Admitting: Family Medicine
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
## 2019-06-20 NOTE — Telephone Encounter (Signed)
Needs virtual visit please.  

## 2019-06-21 ENCOUNTER — Other Ambulatory Visit: Payer: Self-pay

## 2019-06-21 ENCOUNTER — Ambulatory Visit (INDEPENDENT_AMBULATORY_CARE_PROVIDER_SITE_OTHER): Payer: PPO | Admitting: Family Medicine

## 2019-06-21 ENCOUNTER — Encounter: Payer: Self-pay | Admitting: Family Medicine

## 2019-06-21 DIAGNOSIS — R631 Polydipsia: Secondary | ICD-10-CM

## 2019-06-21 DIAGNOSIS — I951 Orthostatic hypotension: Secondary | ICD-10-CM | POA: Diagnosis not present

## 2019-06-21 DIAGNOSIS — L989 Disorder of the skin and subcutaneous tissue, unspecified: Secondary | ICD-10-CM | POA: Diagnosis not present

## 2019-06-21 NOTE — Progress Notes (Signed)
Chief Complaint  Patient presents with  . Dizziness  . Diarrhea    Samantha Gregory is 84 y.o. pt here for dizziness. Due to COVID-19 pandemic, we are interacting via telephone. I verified patient's ID using 2 identifiers. Patient agreed to proceed with visit via this method. Patient is at home, I am at office. Patient and I are present for visit.    Duration: 1 week  Lasts for several seconds when she rises Pass out? No Spinning? No Recent illness/fever? No Headache? No Neurologic signs? No Change in PO intake? No  Increased thirst/urination.  Thinks skin cancer on back and nose has returned.   ROS:  Neuro: As noted in HPI Eyes: No vision changes  Past Medical History:  Diagnosis Date  . Essential hypertension   . Heart palpitations   . Hypercholesterolemia   . Mitral regurgitation   . Orthostatic hypotension   . Paroxysmal atrial tachycardia ([place])   . PVC's (premature ventricular contractions)   . Squamous cell carcinoma, leg, right   . Squamous cell carcinoma, leg, right     Family History  Problem Relation Age of Onset  . Heart attack Father   . Stroke Mother   . Kidney disease Mother   . Heart disease Brother   . Hypertension Brother   . Heart disease Sister   . Heart disease Sister   . Stroke Paternal Uncle   . Stroke Paternal Aunt    Exam No conversational dyspnea Age appropriate judgment and insight Nml affect and mood  Orthostasis - Plan: drink fluids w electrolytes in add'n to water. Get up slowly.   Increased thirst - Plan: will ck CMP, CBC, A1c at next visit.   Skin lesions - Plan: will plan for exam and biopsy next week  Total time spent: 15 min F/u prn. Pt voiced understanding and agreement to the plan.  [place], DO 06/21/19 11:42 AM

## 2019-06-25 ENCOUNTER — Ambulatory Visit (INDEPENDENT_AMBULATORY_CARE_PROVIDER_SITE_OTHER): Payer: PPO | Admitting: Family Medicine

## 2019-06-25 ENCOUNTER — Other Ambulatory Visit: Payer: Self-pay

## 2019-06-25 ENCOUNTER — Encounter: Payer: Self-pay | Admitting: Family Medicine

## 2019-06-25 VITALS — Temp 97.8°F | Resp 16 | Ht 65.0 in | Wt 84.0 lb

## 2019-06-25 DIAGNOSIS — R634 Abnormal weight loss: Secondary | ICD-10-CM

## 2019-06-25 DIAGNOSIS — R631 Polydipsia: Secondary | ICD-10-CM | POA: Diagnosis not present

## 2019-06-25 DIAGNOSIS — Z8669 Personal history of other diseases of the nervous system and sense organs: Secondary | ICD-10-CM

## 2019-06-25 DIAGNOSIS — H5462 Unqualified visual loss, left eye, normal vision right eye: Secondary | ICD-10-CM | POA: Diagnosis not present

## 2019-06-25 DIAGNOSIS — L989 Disorder of the skin and subcutaneous tissue, unspecified: Secondary | ICD-10-CM

## 2019-06-25 DIAGNOSIS — R2681 Unsteadiness on feet: Secondary | ICD-10-CM | POA: Diagnosis not present

## 2019-06-25 DIAGNOSIS — L820 Inflamed seborrheic keratosis: Secondary | ICD-10-CM

## 2019-06-25 DIAGNOSIS — C44321 Squamous cell carcinoma of skin of nose: Secondary | ICD-10-CM | POA: Diagnosis not present

## 2019-06-25 LAB — COMPREHENSIVE METABOLIC PANEL
ALT: 19 U/L (ref 0–35)
AST: 23 U/L (ref 0–37)
Albumin: 4 g/dL (ref 3.5–5.2)
Alkaline Phosphatase: 115 U/L (ref 39–117)
BUN: 24 mg/dL — ABNORMAL HIGH (ref 6–23)
CO2: 30 mEq/L (ref 19–32)
Calcium: 9 mg/dL (ref 8.4–10.5)
Chloride: 104 mEq/L (ref 96–112)
Creatinine, Ser: 0.78 mg/dL (ref 0.40–1.20)
GFR: 70.79 mL/min (ref >60.00)
Glucose, Bld: 76 mg/dL (ref 70–99)
Potassium: 4 mEq/L (ref 3.5–5.1)
Sodium: 142 mEq/L (ref 135–145)
Total Bilirubin: 0.7 mg/dL (ref 0.2–1.2)
Total Protein: 6.4 g/dL (ref 6.0–8.3)

## 2019-06-25 LAB — CBC
HCT: 35.1 % — ABNORMAL LOW (ref 36.0–46.0)
Hemoglobin: 11.8 g/dL — ABNORMAL LOW (ref 12.0–15.0)
MCHC: 33.6 g/dL (ref 30.0–36.0)
MCV: 89.9 fl (ref 78.0–100.0)
Platelets: 236 10*3/uL (ref 150.0–400.0)
RBC: 3.91 Mil/uL (ref 3.87–5.11)
RDW: 15.2 % (ref 11.5–15.5)
WBC: 4.5 10*3/uL (ref 4.0–10.5)

## 2019-06-25 LAB — TSH: TSH: 3.58 u[IU]/mL (ref 0.35–4.50)

## 2019-06-25 LAB — T4, FREE: Free T4: 0.69 ng/dL (ref 0.60–1.60)

## 2019-06-25 LAB — HEMOGLOBIN A1C: Hgb A1c MFr Bld: 5.5 % (ref 4.6–6.5)

## 2019-06-25 MED ORDER — TRIAMCINOLONE ACETONIDE 0.1 % EX CREA: 1.0000 "application " | TOPICAL_CREAM | Freq: Two times a day (BID) | CUTANEOUS | 0 refills | Status: DC

## 2019-06-25 NOTE — Addendum Note (Signed)
Addended by: Wynonia Musty A on: 06/25/2019 11:36 AM   Modules accepted: Orders

## 2019-06-25 NOTE — Progress Notes (Signed)
Chief Complaint  Patient presents with  . Dizziness    head swims    Samantha Gregory is a 84 y.o. female here for a skin complaint.  Duration: several months Location: nose and back Pruritic? Nose is not, back is Painful? No Drainage? No New soaps/lotions/topicals/detergents? No Sick contacts? No Other associated symptoms: nose will scab, peel and then return Therapies tried thus far: none +hx of SCC  Unsteadiness continues. +famhx of DM. Stays hydrated. Able to work in yard without issue. When she goes from laying to seated position, it happens.   Hx of cataracts and vision loss in L eye. Current eye provider is no longer accepting insurance, needs new referral.   ROS:  Const: No fevers Skin: As noted in HPI  Past Medical History:  Diagnosis Date  . Essential hypertension   . Heart palpitations   . Hypercholesterolemia   . Mitral regurgitation   . Orthostatic hypotension   . Paroxysmal atrial tachycardia ([place])   . PVC's (premature ventricular contractions)   . Squamous cell carcinoma, leg, right   . Squamous cell carcinoma, leg, right     BP 122/70 (BP Location: Right Arm, Patient Position: Sitting, Cuff Size: Normal)   Pulse 62   Temp 97.8 F (36.6 C) (Oral)   Resp 16   Ht 5\' 5"  (1.651 m)   Wt 84 lb (38.1 kg)   SpO2 98%   BMI 13.98 kg/m  Gen: awake, alert, appearing stated age Lungs: No accessory muscle use Skin: See below. No drainage, erythema, TTP, fluctuance, excessive warmth Psych: Age appropriate judgment and insight     R upper back  Procedure note; shave biopsy Informed consent was obtained. The areas on nose and back were cleaned with alcohol and the nose was injected with 0.5 mL of 1% lidocaine without epinephrine.  A Dermablade was slightly bent and used to cut under the area of interest. The specimen was placed in a sterile specimen cup and sent to the lab. The area was then cauterized ensuring adequate hemostasis. The area was  dressed with triple antibiotic ointment and a bandage. The back was injected with 1 cc of 1% lido w epi. The same process was repeated.  There were no complications noted. The patient tolerated the procedure well.   Unsteadiness - Plan: CBC, Comprehensive metabolic panel; orthostatics neg. If labs unremarkable, will consider referral to cards vs echo.   Skin lesion - Plan: Biopsies as above. I am worried for SCC on the nose. Less concerned about back, but given her hx, will check.    Increased thirst - Plan: Hemoglobin A1c  Weight loss - Plan: T4, free, TSH  Vision loss, left eye - Plan: Ambulatory referral to Ophthalmology  History of cataract - Plan: Ambulatory referral to Ophthalmology  Orders as above. F/u prn. The patient voiced understanding and agreement to the plan.  [place], DO 06/25/19 8:46 AM

## 2019-06-25 NOTE — Patient Instructions (Addendum)
If you do not hear anything about your referral in the next 1-2 weeks, call our office and ask for an update.  Give [place] 2-3 business days to get the results of your labs back.   Do not shower for the rest of the day. When you do wash it, use only soap and water. Do not vigorously scrub. Apply triple antibiotic ointment (like Neosporin) twice daily. Keep the area clean and dry.   Things to look out for: increasing pain not relieved by ibuprofen/acetaminophen, fevers, spreading redness, drainage of pus, or foul odor.  Give [place] a business week to get the results of your skin biopsies.   Let us know if you need anything.

## 2019-06-28 ENCOUNTER — Telehealth: Payer: Self-pay | Admitting: Family Medicine

## 2019-06-28 NOTE — Telephone Encounter (Signed)
Patient calling for lab results from 6/30.

## 2019-07-01 ENCOUNTER — Other Ambulatory Visit: Payer: Self-pay | Admitting: Family Medicine

## 2019-07-01 DIAGNOSIS — D649 Anemia, unspecified: Secondary | ICD-10-CM

## 2019-07-02 ENCOUNTER — Other Ambulatory Visit: Payer: Self-pay | Admitting: Family Medicine

## 2019-07-02 DIAGNOSIS — C44321 Squamous cell carcinoma of skin of nose: Secondary | ICD-10-CM

## 2019-07-02 NOTE — Progress Notes (Signed)
am

## 2019-07-04 ENCOUNTER — Other Ambulatory Visit (INDEPENDENT_AMBULATORY_CARE_PROVIDER_SITE_OTHER): Payer: PPO

## 2019-07-04 ENCOUNTER — Other Ambulatory Visit: Payer: Self-pay

## 2019-07-04 DIAGNOSIS — D649 Anemia, unspecified: Secondary | ICD-10-CM

## 2019-07-04 LAB — IBC + FERRITIN
Ferritin: 36.1 ng/mL (ref 10.0–291.0)
Iron: 87 ug/dL (ref 42–145)
Saturation Ratios: 26.7 % (ref 20.0–50.0)
Transferrin: 233 mg/dL (ref 212.0–360.0)

## 2019-07-05 ENCOUNTER — Encounter: Payer: PPO | Admitting: Family Medicine

## 2019-07-05 ENCOUNTER — Ambulatory Visit (INDEPENDENT_AMBULATORY_CARE_PROVIDER_SITE_OTHER): Payer: PPO | Admitting: Family Medicine

## 2019-07-05 ENCOUNTER — Encounter: Payer: Self-pay | Admitting: Family Medicine

## 2019-07-05 VITALS — BP 142/76 | HR 77 | Temp 98.5°F | Ht 64.0 in | Wt 85.0 lb

## 2019-07-05 DIAGNOSIS — R358 Other polyuria: Secondary | ICD-10-CM | POA: Diagnosis not present

## 2019-07-05 DIAGNOSIS — C44321 Squamous cell carcinoma of skin of nose: Secondary | ICD-10-CM | POA: Diagnosis not present

## 2019-07-05 DIAGNOSIS — D649 Anemia, unspecified: Secondary | ICD-10-CM | POA: Diagnosis not present

## 2019-07-05 DIAGNOSIS — R42 Dizziness and giddiness: Secondary | ICD-10-CM

## 2019-07-05 DIAGNOSIS — R3589 Other polyuria: Secondary | ICD-10-CM

## 2019-07-05 LAB — POCT URINALYSIS DIPSTICK
Bilirubin, UA: NEGATIVE
Blood, UA: NEGATIVE
Glucose, UA: NEGATIVE
Ketones, UA: NEGATIVE
Leukocytes, UA: NEGATIVE
Nitrite, UA: NEGATIVE
Protein, UA: POSITIVE — AB
Spec Grav, UA: >=1.03 — AB (ref 1.010–1.025)
Urobilinogen, UA: 0.2 E.U./dL
pH, UA: 5.5 (ref 5.0–8.0)

## 2019-07-05 LAB — CBC
HCT: 37.5 % (ref 36.0–46.0)
Hemoglobin: 12.1 g/dL (ref 12.0–15.0)
MCHC: 32.4 g/dL (ref 30.0–36.0)
MCV: 90.2 fl (ref 78.0–100.0)
Platelets: 233 10*3/uL (ref 150.0–400.0)
RBC: 4.16 Mil/uL (ref 3.87–5.11)
RDW: 15.2 % (ref 11.5–15.5)
WBC: 5.3 10*3/uL (ref 4.0–10.5)

## 2019-07-05 NOTE — Patient Instructions (Addendum)
If you do not hear anything about your referral in the next 1-2 weeks, call our office and ask for an update.  I would love to manage the area on your nose, but I think you will be in better hands with a dermatologist.  Give [place] 2-3 business days to get the results of your labs back.   I hope you are able to see Dr Clayton Bibles, but I want you seen by a cardiologist.  Stay hydrated.  Let us know if you need anything.

## 2019-07-05 NOTE — Progress Notes (Signed)
Chief Complaint  Patient presents with  . Dizziness    Subjective: Patient is a 84 y.o. female here for f/u dizziness.  She was seen 05/29/2019 for the same thing feels that is gotten worse.  Lab large amount of is more related to physical exertion but sometimes when she bends over she will have a headache.  She does follow-up with the cardiology team for mitral regurgitation and paroxysmal tachycardia.  She has been trying to make an appointment with a cardiologist but has not been available to do so.  Labs in 6/30 showed a low hemoglobin.  Iron studies have been unchanged from last study.  Patient has history of squamous cell carcinoma.  She recently had a biopsy on her nose.  She would like me to manage this further.  She does not want to go back to [facility] dermatology or her private practice provider in dermatology.   ROS: Heart: Denies chest pain  Lungs: Denies SOB   Past Medical History:  Diagnosis Date  . Essential hypertension   . Heart palpitations   . Hypercholesterolemia   . Mitral regurgitation   . Orthostatic hypotension   . Paroxysmal atrial tachycardia ([place])   . PVC's (premature ventricular contractions)   . Squamous cell carcinoma, leg, right   . Squamous cell carcinoma, leg, right     Objective: BP (!) 142/76 (BP Location: Left Arm, Patient Position: Sitting, Cuff Size: Normal)   Pulse 77   Temp 98.5 F (36.9 C) (Oral)   Ht 5\' 4"  (1.626 m)   Wt 85 lb (38.6 kg)   SpO2 98%   BMI 14.59 kg/m  General: Awake, appears stated age HEENT: MMM, EOMi Heart: RRR, no LE edema, I did not hear any bruits Lungs: CTAB, no rales, wheezes or rhonchi. No accessory muscle use Psych: Age appropriate judgment and insight, normal affect and mood  Assessment and Plan: Squamous cell carcinoma of nose - Plan: Ambulatory referral to Dermatology, I told her I am not comfortable with this since it is on nose. She may need Mohs.   Lightheadedness - Plan: Ambulatory referral to  Cardiology, needs cardio evaluation given exertional component.   Low hemoglobin - Plan: CBC, Denies rectal or GU bleeding  Polyuria- UA neg.   The patient voiced understanding and agreement to the plan.  [place], DO 07/05/19  12:41 PM

## 2019-07-07 NOTE — Progress Notes (Signed)
Error, please disregard.

## 2019-07-09 ENCOUNTER — Encounter: Payer: Self-pay | Admitting: Cardiology

## 2019-07-09 ENCOUNTER — Encounter: Payer: PPO | Admitting: Cardiology

## 2019-07-09 ENCOUNTER — Other Ambulatory Visit: Payer: Self-pay

## 2019-07-09 ENCOUNTER — Ambulatory Visit (INDEPENDENT_AMBULATORY_CARE_PROVIDER_SITE_OTHER): Payer: PPO | Admitting: Cardiology

## 2019-07-09 VITALS — BP 118/62 | HR 65 | Ht 64.0 in | Wt 86.0 lb

## 2019-07-09 DIAGNOSIS — I951 Orthostatic hypotension: Secondary | ICD-10-CM | POA: Diagnosis not present

## 2019-07-09 DIAGNOSIS — I351 Nonrheumatic aortic (valve) insufficiency: Secondary | ICD-10-CM | POA: Diagnosis not present

## 2019-07-09 DIAGNOSIS — R002 Palpitations: Secondary | ICD-10-CM | POA: Diagnosis not present

## 2019-07-09 DIAGNOSIS — I340 Nonrheumatic mitral (valve) insufficiency: Secondary | ICD-10-CM

## 2019-07-09 HISTORY — DX: Nonrheumatic mitral (valve) insufficiency: I34.0

## 2019-07-09 NOTE — Addendum Note (Signed)
Addended by: Beckey Rutter on: 07/09/2019 09:29 AM   Modules accepted: Orders

## 2019-07-09 NOTE — Progress Notes (Signed)
Cardiology Office Note:    Date:  07/09/2019   ID:  Samantha Gregory, [place] 1935/08/10, MRN [id number]  PCP:  Samantha Pal, DO  Cardiologist:  Jenean Lindau, MD   Referring MD: Samantha Gregory*    ASSESSMENT:    1. Moderate aortic insufficiency   2. Orthostatic hypotension   3. Mitral valve insufficiency, unspecified etiology   4. Palpitations    PLAN:    In order of problems listed above:  1. Valvular heart disease: I reviewed previous records.  She has significant moderate aortic regurgitation, mitral and tricuspid regurgitation.  We will do an echocardiogram to assess the progress on these valvular lesions and to assess her left ventricular systolic function and and cardiac anatomy and chamber size.  This will help [place] in management.  She is a frail lady and she is overall healthy. 2. With her issues of dizziness and palpitations I think there is a significant element of orthostatic hypotension specially working in the warm weather outside.  I advised her to keep herself well-hydrated and she is agreeable.. 3. I reviewed her lab work extensively and discussed this with her.  We will see her in follow-up appointment in 6 weeks or earlier if she has any concerns.  She knows to go to the nearest emergency room for any concerning symptoms.   Medication Adjustments/Labs and Tests Ordered: Current medicines are reviewed at length with the patient today.  Concerns regarding medicines are outlined above.  No orders of the defined types were placed in this encounter.  No orders of the defined types were placed in this encounter.    History of Present Illness:    Samantha Gregory is a 84 y.o. female who is being seen today for the evaluation of valvular heart disease at the request of Samantha Gregory*.  Patient is a pleasant 84 year old female.  She has past medical history of aortic, mitral and tricuspid regurgitation.  This has been followed closely  by her primary care physician.  She now complains of occasional palpitations and dizziness.  These occur mostly with postural changes.  No orthopnea or PND.  No chest pain.  For this reason she is here to be evaluated.  Predominantly palpitations and occasional dizziness especially suggesting postural hypotension is an issue and also she needs a follow-up for valvular heart disease as mentioned above.  She is an active woman and takes care of activities of daily living.  At the time of my evaluation, the patient is alert awake oriented and in no distress.  Past Medical History:  Diagnosis Date  . Essential hypertension   . Heart palpitations   . Hypercholesterolemia   . Mitral regurgitation   . Orthostatic hypotension   . Paroxysmal atrial tachycardia ([place])   . PVC's (premature ventricular contractions)   . Squamous cell carcinoma, leg, right   . Squamous cell carcinoma, leg, right     Past Surgical History:  Procedure Laterality Date  . APPENDECTOMY    . TEE WITHOUT CARDIOVERSION N/A 09/08/2015   Procedure: TRANSESOPHAGEAL ECHOCARDIOGRAM (TEE);  Surgeon: Lelon Perla, MD;  Location: [facility] ENDOSCOPY;  Service: Cardiovascular;  Laterality: N/A;    Current Medications: No outpatient medications have been marked as taking for the 07/09/19 encounter (Office Visit) with Samantha Gregory, Reita Cliche, MD.     Allergies:   Statins, Diazolidinyl urea [urea], Gold sodium thiosulfate, and Isothiazolinone chloride   Social History   Socioeconomic History  . Marital status: Widowed  Spouse name: Not on file  . Number of children: Not on file  . Years of education: Not on file  . Highest education level: Not on file  Occupational History  . Not on file  Social Needs  . Financial resource strain: Not on file  . Food insecurity    Worry: Not on file    Inability: Not on file  . Transportation needs    Medical: Not on file    Non-medical: Not on file  Tobacco Use  . Smoking status: Never Smoker   . Smokeless tobacco: Never Used  Substance and Sexual Activity  . Alcohol use: Yes  . Drug use: No  . Sexual activity: Not on file  Lifestyle  . Physical activity    Days per week: Not on file    Minutes per session: Not on file  . Stress: Not on file  Relationships  . Social Herbalist on phone: Not on file    Gets together: Not on file    Attends religious service: Not on file    Active member of club or organization: Not on file    Attends meetings of clubs or organizations: Not on file    Relationship status: Not on file  Other Topics Concern  . Not on file  Social History Narrative  . Not on file     Family History: The patient's family history includes Heart attack in her father; Heart disease in her brother, sister, and sister; Hypertension in her brother; Kidney disease in her mother; Stroke in her mother, paternal aunt, and paternal uncle.  ROS:   Please see the history of present illness.    All other systems reviewed and are negative.  EKGs/Labs/Other Studies Reviewed:    The following studies were reviewed today: EKG reveals sinus rhythm and nonspecific ST-T changes   Recent Labs: 06/25/2019: ALT 19; BUN 24; Creatinine, Ser 0.78; Potassium 4.0; Sodium 142; TSH 3.58 07/05/2019: Hemoglobin 12.1; Platelets 233.0  Recent Lipid Panel    Component Value Date/Time   CHOL 253 (H) 01/14/2019 0736   TRIG 95 01/14/2019 0736   HDL 96 01/14/2019 0736   CHOLHDL 2.6 01/14/2019 0736   CHOLHDL 2.3 12/30/2015 0741   VLDL 17 12/30/2015 0741   LDLCALC 138 (H) 01/14/2019 0736   LDLDIRECT 117.0 02/05/2014 0812    Physical Exam:    VS:  BP 118/62 (BP Location: Left Arm, Patient Position: Sitting, Cuff Size: Normal)   Pulse 65   Ht 5\' 4"  (1.626 m)   Wt 86 lb (39 kg)   SpO2 98%   BMI 14.76 kg/m     Wt Readings from Last 3 Encounters:  07/09/19 86 lb (39 kg)  07/05/19 85 lb (38.6 kg)  07/05/19 85 lb (38.6 kg)     GEN: Patient is in no acute distress  HEENT: Normal NECK: No JVD; No carotid bruits LYMPHATICS: No lymphadenopathy CARDIAC: S1 S2 regular, 2/6 systolic murmur at the apex. RESPIRATORY:  Clear to auscultation without rales, wheezing or rhonchi  ABDOMEN: Soft, non-tender, non-distended MUSCULOSKELETAL:  No edema; No deformity  SKIN: Warm and dry NEUROLOGIC:  Alert and oriented x 3 PSYCHIATRIC:  Normal affect    Signed, Jenean Lindau, MD  07/09/2019 9:10 AM    [place]

## 2019-07-09 NOTE — Patient Instructions (Addendum)
Medication Instructions:  Your physician recommends that you continue on your current medications as directed. Please refer to the Current Medication list given to you today.  If you need a refill on your cardiac medications before your next appointment, please call your pharmacy.   Lab work: NONE If you have labs (blood work) drawn today and your tests are completely normal, you will receive your results only by: Marland Kitchen MyChart Message (if you have MyChart) OR . A paper copy in the mail If you have any lab test that is abnormal or we need to change your treatment, we will call you to review the results.  Testing/Procedures: You had an EKG performed today.  Your physician has recommended that you wear a ZIO monitor. ZIO monitors are medical devices that record the heart's electrical activity. Doctors most often use these monitors to diagnose arrhythmias. Arrhythmias are problems with the speed or rhythm of the heartbeat. The monitor is a small, portable device. You can wear one while you do your normal daily activities. This is usually used to diagnose what is causing palpitations/syncope (passing out). This device will be mailed to you at your home. You will wear this device for 14 days.  Your physician has requested that you have an echocardiogram. Echocardiography is a painless test that uses sound waves to create images of your heart. It provides your doctor with information about the size and shape of your heart and how well your heart's chambers and valves are working. This procedure takes approximately one hour. There are no restrictions for this procedure.    Follow-Up: At [facility], you and your health needs are our priority.  As part of our continuing mission to provide you with exceptional heart care, we have created designated Provider Care Teams.  These Care Teams include your primary Cardiologist (physician) and Advanced Practice Providers (APPs -  Physician Assistants and Nurse  Practitioners) who all work together to provide you with the care you need, when you need it. You will need a follow up appointment in 6 weeks.     Any Other Special Instructions Will Be Listed Below   Echocardiogram An echocardiogram is a procedure that uses painless sound waves (ultrasound) to produce an image of the heart. Images from an echocardiogram can provide important information about:  Signs of coronary artery disease (CAD).  Aneurysm detection. An aneurysm is a weak or damaged part of an artery wall that bulges out from the normal force of blood pumping through the body.  Heart size and shape. Changes in the size or shape of the heart can be associated with certain conditions, including heart failure, aneurysm, and CAD.  Heart muscle function.  Heart valve function.  Signs of a past heart attack.  Fluid buildup around the heart.  Thickening of the heart muscle.  A tumor or infectious growth around the heart valves. Tell a health care provider about:  Any allergies you have.  All medicines you are taking, including vitamins, herbs, eye drops, creams, and over-the-counter medicines.  Any blood disorders you have.  Any surgeries you have had.  Any medical conditions you have.  Whether you are pregnant or may be pregnant. What are the risks? Generally, this is a safe procedure. However, problems may occur, including:  Allergic reaction to dye (contrast) that may be used during the procedure. What happens before the procedure? No specific preparation is needed. You may eat and drink normally. What happens during the procedure?   An IV tube  may be inserted into one of your veins.  You may receive contrast through this tube. A contrast is an injection that improves the quality of the pictures from your heart.  A gel will be applied to your chest.  A wand-like tool (transducer) will be moved over your chest. The gel will help to transmit the sound waves from  the transducer.  The sound waves will harmlessly bounce off of your heart to allow the heart images to be captured in real-time motion. The images will be recorded on a computer. The procedure may vary among health care providers and hospitals. What happens after the procedure?  You may return to your normal, everyday life, including diet, activities, and medicines, unless your health care provider tells you not to do that. Summary  An echocardiogram is a procedure that uses painless sound waves (ultrasound) to produce an image of the heart.  Images from an echocardiogram can provide important information about the size and shape of your heart, heart muscle function, heart valve function, and fluid buildup around your heart.  You do not need to do anything to prepare before this procedure. You may eat and drink normally.  After the echocardiogram is completed, you may return to your normal, everyday life, unless your health care provider tells you not to do that. This information is not intended to replace advice given to you by your health care provider. Make sure you discuss any questions you have with your health care provider. Document Released: 12/09/2000 Document Revised: 04/04/2019 Document Reviewed: 01/14/2017 Elsevier Patient Education  2020 [employer].

## 2019-07-10 ENCOUNTER — Telehealth: Payer: Self-pay

## 2019-07-10 NOTE — Telephone Encounter (Signed)
Rest and making sure she doesn't overdo it is most important. Ice/Tylenol otherwise. Ty.

## 2019-07-10 NOTE — Telephone Encounter (Signed)
Copied from [place] [phone]. Topic: General - Other >> Jul 10, 2019  9:08 AM Percell Belt A wrote: Reason for CRM: pt called stated that she hurt ribs again out doing yard work. She stated that Dr probably Lynnae Prude want to see her, just tell her what to do to help them heal.  She stated that she does this often  Best number [phone]

## 2019-07-10 NOTE — Telephone Encounter (Signed)
Called informed of instructions. She stated she has had another pain this afternoon that was not there this am, but does bother her from time to time and said she has discussed with PCP before. The patient is in the groin area and hurts down her leg and all afternoon has hurt a lot and not letting up, does not know what is wrong. She also mentioned she is drinking a lot os [employer] because she feels she needs to burp.

## 2019-07-10 NOTE — Telephone Encounter (Signed)
If it persists, we can evaluate her in the office.

## 2019-07-10 NOTE — Telephone Encounter (Signed)
Called left message to call back 

## 2019-07-11 DIAGNOSIS — C44321 Squamous cell carcinoma of skin of nose: Secondary | ICD-10-CM | POA: Diagnosis not present

## 2019-07-12 NOTE — Telephone Encounter (Signed)
Patient informed of PCP instructions. Scheduled appt for Monday 7/20

## 2019-07-15 ENCOUNTER — Encounter: Payer: Self-pay | Admitting: Family Medicine

## 2019-07-15 ENCOUNTER — Other Ambulatory Visit: Payer: Self-pay

## 2019-07-15 ENCOUNTER — Ambulatory Visit (INDEPENDENT_AMBULATORY_CARE_PROVIDER_SITE_OTHER): Payer: PPO | Admitting: Family Medicine

## 2019-07-15 VITALS — BP 118/80 | HR 73 | Temp 98.1°F | Wt 87.0 lb

## 2019-07-15 DIAGNOSIS — I8393 Asymptomatic varicose veins of bilateral lower extremities: Secondary | ICD-10-CM

## 2019-07-15 DIAGNOSIS — H5462 Unqualified visual loss, left eye, normal vision right eye: Secondary | ICD-10-CM | POA: Diagnosis not present

## 2019-07-15 DIAGNOSIS — M25552 Pain in left hip: Secondary | ICD-10-CM

## 2019-07-15 NOTE — Patient Instructions (Addendum)
The number for the [place] office for your nose procedure is [phone].  If you don't hear anything from me, there is nothing to do related to your varicose veins at this time. If you do, we will update you and refer you back to the vascular team.  If you do not hear anything about your referral in the next 1-2 weeks, call our office and ask for an update.  Let us know if you need anything.   Hip Exercises It is normal to feel mild stretching, pulling, tightness, or discomfort as you do these exercises, but you should stop right away if you feel sudden pain or your pain gets worse.  STRETCHING AND RANGE OF MOTION EXERCISES These exercises warm up your muscles and joints and improve the movement and flexibility of your hip. These exercises also help to relieve pain, numbness, and tingling. Exercise A: Hamstrings, Supine  1. Lie on your back. 2. Loop a belt or towel over the ball of your left / rightfoot. The ball of your foot is on the walking surface, right under your toes. 3. Straighten your left / rightknee and slowly pull on the belt to raise your leg. ? Do not let your left / right knee bend while you do this. ? Keep your other leg flat on the floor. ? Raise the left / right leg until you feel a gentle stretch behind your left / right knee or thigh. 4. Hold this position for 30 seconds. 5. Slowly return your leg to the starting position. Repeat2 times. Complete this stretch 3 times per week. Exercise B: Hip Rotators  1. Lie on your back on a firm surface. 2. Hold your left / right knee with your left / right hand. Hold your ankle with your other hand. 3. Gently pull your left / right knee and rotate your lower leg toward your other shoulder. ? Pull until you feel a stretch in your buttocks. ? Keep your hips and shoulders firmly planted while you do this stretch. 4. Hold this position for 30 seconds. Repeat 2 times. Complete this stretch 3 times per week. Exercise C:  V-Sit (Hamstrings and Adductors)  1. Sit on the floor with your legs extended in a large "V" shape. Keep your knees straight during this exercise. 2. Start with your head and chest upright, then bend at your waist to reach for your left foot (position A). You should feel a stretch in your right inner thigh. 3. Hold this position for 30 seconds. Then slowly return to the upright position. 4. Bend at your waist to reach forward (position B). You should feel a stretch behind both of your thighs and knees. 5. Hold this position for 30 seconds. Then slowly return to the upright position. 6. Bend at your waist to reach for your right foot (position C). You should feel a stretch in your left inner thigh. 7. Hold this position for 30 seconds. Then slowly return to the upright position. Repeat A, B, and C 2 times each. Complete this stretch 3 times per week. Exercise D: Lunge (Hip Flexors)  1. Place your left / right knee on the floor and bend your other knee so that is directly over your ankle. You should be half-kneeling. 2. Keep good posture with your head over your shoulders. 3. Tighten your buttocks to point your tailbone downward. This helps your back to keep from arching too much. 4. You should feel a gentle stretch in the front of your left /  right thigh and hip. If you do not feel any resistance, slightly slide your other foot forward and then slowly lunge forward so your knee once again lines up over your ankle. 5. Make sure your tailbone continues to point downward. 6. Hold this position for 30 seconds. Repeat 2 times. Complete this stretch 3 times per week.  STRENGTHENING EXERCISES These exercises build strength and endurance in your hip. Endurance is the ability to use your muscles for a long time, even after they get tired. Exercise E: Bridge (Hip Extensors)  1. Lie on your back on a firm surface with your knees bent and your feet flat on the floor. 2. Tighten your buttocks muscles and  lift your bottom off the floor until the trunk of your body is level with your thighs. ? Do not arch your back. ? You should feel the muscles working in your buttocks and the back of your thighs. If you do not feel these muscles, slide your feet 1-2 inches (2.5-5 cm) farther away from your buttocks. 3. Hold this position for 3 seconds. 4. Slowly lower your hips to the starting position. Repeat for a total of 10 repetitions. 5. Let your muscles relax completely between repetitions. 6. If this exercise is too easy, try doing it with your arms crossed over your chest. Repeat 2 times. Complete this exercise 3 times per week. Exercise F: Straight Leg Raises - Hip Abductors  1. Lie on your side with your left / right leg in the top position. Lie so your head, shoulder, knee, and hip line up with each other. You may bend your bottom knee to help you balance. 2. Roll your hips slightly forward, so your hips are stacked directly over each other and your left / right knee is facing forward. 3. Leading with your heel, lift your top leg 4-6 inches (10-15 cm). You should feel the muscles in your outer hip lifting. ? Do not let your foot drift forward. ? Do not let your knee roll toward the ceiling. 4. Hold this position for 1 second. 5. Slowly return to the starting position. 6. Let your muscles relax completely between repetitions. Repeat for a total of 10 repetitions.  Repeat 2 times. Complete this exercise 3 times per week. Exercise G: Straight Leg Raises - Hip Adductors  1. Lie on your side with your left / right leg in the bottom position. Lie so your head, shoulder, knee, and hip line up. You may place your upper foot in front to help you balance. 2. Roll your hips slightly forward, so your hips are stacked directly over each other and your left / right knee is facing forward. 3. Tense the muscles in your inner thigh and lift your bottom leg 4-6 inches (10-15 cm). 4. Hold this position for 1  second. 5. Slowly return to the starting position. 6. Let your muscles relax completely between repetitions. Repeat for a total of 10 repetitions. Repeat 2 times. Complete this exercise 3 times per week. Exercise H: Straight Leg Raises - Quadriceps  1. Lie on your back with your left / right leg extended and your other knee bent. 2. Tense the muscles in the front of your left / right thigh. When you do this, you should see your kneecap slide up or see increased dimpling just above your knee. 3. Tighten these muscles even more and raise your leg 4-6 inches (10-15 cm) off the floor. 4. Hold this position for 3 seconds. 5. Keep these muscles  tense as you lower your leg. 6. Relax the muscles slowly and completely between repetitions. Repeat for a total of 10 repetitions. Repeat 2 times. Complete this exercise 3 times per week. Exercise I: Hip Abductors, Standing 1. Tie one end of a rubber exercise band or tubing to a secure surface, such as a table or pole. 2. Loop the other end of the band or tubing around your left / right ankle. 3. Keeping your ankle with the band or tubing directly opposite of the secured end, step away until there is tension in the tubing or band. Hold onto a chair as needed for balance. 4. Lift your left / right leg out to your side. While you do this: ? Keep your back upright. ? Keep your shoulders over your hips. ? Keep your toes pointing forward. ? Make sure to use your hip muscles to lift your leg. Do not "throw" your leg or tip your body to lift your leg. 5. Hold this position for 1 second. 6. Slowly return to the starting position. Repeat for a total of 10 repetitions. Repeat 2 times. Complete this exercise 3 times per week. Exercise J: Squats (Quadriceps) 1. Stand in a door frame so your feet and knees are in line with the frame. You may place your hands on the frame for balance. 2. Slowly bend your knees and lower your hips like you are going to sit in a  chair. ? Keep your lower legs in a straight-up-and-down position. ? Do not let your hips go lower than your knees. ? Do not bend your knees lower than told by your health care provider. ? If your hip pain increases, do not bend as low. 3. Hold this position for 1 second. 4. Slowly push with your legs to return to standing. Do not use your hands to pull yourself to standing. Repeat for a total of 10 repetitions. Repeat 2 times. Complete this exercise 3 times per week. Make sure you discuss any questions you have with your health care provider. Document Released: 12/30/2005 Document Revised: 09/05/2016 Document Reviewed: 12/07/2015 Elsevier Interactive Patient Education  [employer].

## 2019-07-15 NOTE — Progress Notes (Signed)
Musculoskeletal Exam  Patient: Samantha Gregory DOB: 1935/07/17  DOS: 07/15/2019  SUBJECTIVE:  Chief Complaint:   Chief Complaint  Patient presents with  . Groin Pain    Samantha Gregory is a 84 y.o.  female for evaluation and treatment of L hip pain.   Onset:  Many years, most recent was 5 d Location: L anterior hip Character:  sharp; lasted for 2 hrs and then resolved Associated symptoms: gas Denies constipation, blood in stool, fevers, urinary complaints Treatment: to date has been acetaminophen.   Neurovascular symptoms: no  Has hx of cataracts and vision is struggling in L eye. Had been following specialist at [facility], would like another provider.  She has a history of varicose veins.  She also has itching legs.  She would like to know if this is causing her itching.  She has been referred to a vascular surgeon in the past.  She does not want it taken care of if it will not solve her itching. They do not hurt.   ROS: Musculoskeletal/Extremities: +R hip pain Eyes: +vision loss of L  Past Medical History:  Diagnosis Date  . Essential hypertension   . Heart palpitations   . Hypercholesterolemia   . Mitral regurgitation   . Orthostatic hypotension   . Paroxysmal atrial tachycardia ([place])   . PVC's (premature ventricular contractions)   . Squamous cell carcinoma, leg, right   . Squamous cell carcinoma, leg, right     Objective: VITAL SIGNS: BP 118/80 (BP Location: Left Arm, Patient Position: Sitting, Cuff Size: Small)   Pulse 73   Temp 98.1 F (36.7 C) (Oral)   Wt 87 lb (39.5 kg)   SpO2 96%   BMI 14.93 kg/m  Constitutional: Well formed, well developed. No acute distress. Cardiovascular: Brisk cap refill Thorax & Lungs: No accessory muscle use Musculoskeletal: L hip.   Normal active range of motion: yes.   Normal passive range of motion: yes Tenderness to palpation: yes over hip flexor Deformity: no Ecchymosis: no Tests positive:  Stinchfield Tests negative:log roll, FADDIR, FABER Neurologic: Normal sensory function. No focal deficits noted.  Skin: Varicose veins noted b/l Psychiatric: Normal mood. Age appropriate judgment and insight. Alert & oriented x 3.    Assessment:  Left hip pain - Plan: stretches/exercises. Heat. Tylenol.   Vision loss, left eye - Plan: Ambulatory referral to Ophthalmology  Varicose veins of both lower extremities, unspecified whether complicated - Plan: will curbside specialist, if this can help with itching, will refer after notifying pt, if not, will not inform patient. Pt was told she can expect call within week if anything is found to change the plan.  Plan: Orders as above. F/u as originally scheduled. The patient voiced understanding and agreement to the plan.   [place], DO 07/15/19  3:06 PM

## 2019-07-17 ENCOUNTER — Telehealth: Payer: Self-pay

## 2019-07-17 NOTE — Telephone Encounter (Signed)
I gave them the diagnosis in the referral: Squamous cell carcinoma. The dermatology specialists were supposed to set up a procedure there. Why are they asking for a diagnosis after this was allegedly already done?

## 2019-07-17 NOTE — Telephone Encounter (Signed)
Copied from [place] [id number]. Topic: Referral - Status >> Jul 17, 2019  9:01 AM Scherrie Gerlach wrote: Reason for CRM: pt states the skin surgery center will not give her an appt until for her nose/cancer until you tell them what type of cancer it is. Pt states Dr Nani Ravens diagnosed her, gave her referral, but they will not schedule until they have this information.

## 2019-07-17 NOTE — Telephone Encounter (Signed)
Pathology report has been faxed to both [facility]. [phone]---Derm  Specialist [place] Patient contacted to let know Path Report has been sent to [facility]

## 2019-07-17 NOTE — Telephone Encounter (Signed)
The surgical Center is needing a copy of the pathology report which did not come over when the original derm referral was done.  The copy has been sent to scan and is not in her chart yet.

## 2019-07-19 ENCOUNTER — Ambulatory Visit (HOSPITAL_BASED_OUTPATIENT_CLINIC_OR_DEPARTMENT_OTHER)
Admission: RE | Admit: 2019-07-19 | Discharge: 2019-07-19 | Disposition: A | Discharge status: Home / Self Care | Payer: PPO | Source: Ambulatory Visit | Attending: Cardiology | Admitting: Cardiology

## 2019-07-19 ENCOUNTER — Other Ambulatory Visit: Payer: Self-pay

## 2019-07-19 DIAGNOSIS — I351 Nonrheumatic aortic (valve) insufficiency: Secondary | ICD-10-CM | POA: Insufficient documentation

## 2019-07-19 DIAGNOSIS — I340 Nonrheumatic mitral (valve) insufficiency: Secondary | ICD-10-CM | POA: Diagnosis not present

## 2019-07-19 NOTE — Progress Notes (Signed)
  Echocardiogram 2D Echocardiogram has been performed.  Samantha Gregory 07/19/2019, 1:35 PM

## 2019-07-25 ENCOUNTER — Telehealth: Payer: Self-pay

## 2019-07-25 NOTE — Telephone Encounter (Signed)
-----   Message from Jenean Lindau, MD sent at 07/19/2019  2:17 PM EDT ----- Mild to moderate mitral and aortic regurgitation.  Continue medical treatment.  Normal systolic function.  [facility] primary care Jenean Lindau, MD 07/19/2019 2:17 PM

## 2019-07-25 NOTE — Telephone Encounter (Signed)
Results relayed to pt, copy sent to Dr. Nani Ravens per Dr. Docia Furl request.

## 2019-07-31 ENCOUNTER — Telehealth: Payer: Self-pay | Admitting: *Deleted

## 2019-07-31 NOTE — Telephone Encounter (Signed)
Left message for pt to call us back about the monitor from [employer] left up front here in the [place] office. Wanted to see why it was here.

## 2019-08-01 NOTE — Telephone Encounter (Signed)
Left follow-up message at [phone] requesting call back for information regarding returning [place].

## 2019-08-06 ENCOUNTER — Other Ambulatory Visit: Payer: Self-pay

## 2019-08-06 ENCOUNTER — Ambulatory Visit (HOSPITAL_BASED_OUTPATIENT_CLINIC_OR_DEPARTMENT_OTHER)
Admission: RE | Admit: 2019-08-06 | Discharge: 2019-08-06 | Disposition: A | Discharge status: Home / Self Care | Payer: PPO | Source: Ambulatory Visit | Attending: Family Medicine | Admitting: Family Medicine

## 2019-08-06 ENCOUNTER — Encounter: Payer: Self-pay | Admitting: Family Medicine

## 2019-08-06 ENCOUNTER — Ambulatory Visit (INDEPENDENT_AMBULATORY_CARE_PROVIDER_SITE_OTHER): Payer: PPO | Admitting: Family Medicine

## 2019-08-06 VITALS — BP 132/82 | HR 78 | Temp 98.6°F | Ht 65.0 in | Wt 84.2 lb

## 2019-08-06 DIAGNOSIS — M25552 Pain in left hip: Secondary | ICD-10-CM | POA: Insufficient documentation

## 2019-08-06 DIAGNOSIS — M1612 Unilateral primary osteoarthritis, left hip: Secondary | ICD-10-CM | POA: Diagnosis not present

## 2019-08-06 MED ORDER — MELOXICAM 7.5 MG PO TABS: 7.5000 mg | ORAL_TABLET | Freq: Every day | ORAL | 0 refills | Status: DC

## 2019-08-06 NOTE — Patient Instructions (Signed)
Heat (pad or rice pillow in microwave) over affected area, 10-15 minutes twice daily.   Ice/cold pack over area for 10-15 min twice daily.  Continue those stretches.  OK to take Tylenol 1000 mg (2 extra strength tabs) or 975 mg (3 regular strength tabs) every 6 hours as needed.  We will be in touch regarding your X-ray results.  Let us know if you need anything.

## 2019-08-06 NOTE — Progress Notes (Signed)
Musculoskeletal Exam  Patient: Samantha Gregory DOB: 09-26-1935  DOS: 08/06/2019  SUBJECTIVE:  Chief Complaint:   Chief Complaint  Patient presents with  . Groin Pain    Samantha Gregory is a 84 y.o.  female for evaluation and treatment of L hip pain.   Onset: several weeks. Has been doing heavy yard work Location: L groin Character:  sharp  Progression of issue:  has worsened Associated symptoms: lasts around 2 hrs each time Treatment: to date has been acetaminophen.   Neurovascular symptoms: no  ROS: Musculoskeletal/Extremities: +L hip pain  Past Medical History:  Diagnosis Date  . Essential hypertension   . Heart palpitations   . Hypercholesterolemia   . Mitral regurgitation   . Orthostatic hypotension   . Paroxysmal atrial tachycardia ([place])   . PVC's (premature ventricular contractions)   . Squamous cell carcinoma, leg, right   . Squamous cell carcinoma, leg, right     Objective: VITAL SIGNS: BP 132/82 (BP Location: Left Arm, Patient Position: Sitting, Cuff Size: Normal)   Pulse 78   Temp 98.6 F (37 C) (Axillary)   Ht 5\' 5"  (1.651 m)   Wt 84 lb 4 oz (38.2 kg)   SpO2 96%   BMI 14.02 kg/m  Constitutional: Well formed, well developed. No acute distress. Thorax & Lungs: No accessory muscle use Musculoskeletal: L hip.   Normal active range of motion: yes.   Normal passive range of motion: yes Tenderness to palpation: yes over L hip flexor Deformity: no Ecchymosis: no Tests positive: Stinchfield Tests negative: log roll, FADDIR, FABER Neurologic: Normal sensory function. No focal deficits noted. Psychiatric: Normal mood. Age appropriate judgment and insight. Alert & oriented x 3.    Assessment:  Left hip pain - Plan: DG Hip Unilat W OR W/O Pelvis 2-3 Views Left, meloxicam (MOBIC) 7.5 MG tablet, Ambulatory referral to Orthopedic Surgery, OA looks moderate-severe on imaging, will await official read. Be it surgery or injections, I think she would  benefit from seeing the ortho team.   Plan: Orders as above. F/u prn. The patient voiced understanding and agreement to the plan.   [place], DO 08/06/19  12:46 PM

## 2019-08-13 DIAGNOSIS — C44321 Squamous cell carcinoma of skin of nose: Secondary | ICD-10-CM | POA: Diagnosis not present

## 2019-08-15 ENCOUNTER — Ambulatory Visit: Payer: PPO | Admitting: Orthopaedic Surgery

## 2019-08-19 NOTE — Telephone Encounter (Signed)
Left another message on pt's machine to call us back about the unused monitor she dropped off up front in [place].

## 2019-08-20 ENCOUNTER — Ambulatory Visit (INDEPENDENT_AMBULATORY_CARE_PROVIDER_SITE_OTHER): Payer: PPO | Admitting: Orthopaedic Surgery

## 2019-08-20 ENCOUNTER — Encounter: Payer: Self-pay | Admitting: Orthopaedic Surgery

## 2019-08-20 VITALS — Ht 65.0 in | Wt 84.0 lb

## 2019-08-20 DIAGNOSIS — M25552 Pain in left hip: Secondary | ICD-10-CM

## 2019-08-20 NOTE — Progress Notes (Signed)
Office Visit Note   Patient: Samantha Gregory           Date of Birth: 1935-06-15           MRN: [id number] Visit Date: 08/20/2019              Requested by: [name], [address],  [place] [address] PCP: [name], DO   Assessment & Plan: Visit Diagnoses:  1. Pain in left hip     Plan: Impression is left hip pain of unknown etiology.  She is very active and may be overdoing it.  This could also be coming from her SI joint or lumbar spine.  She will back off of activity. She will follow-up with [place] as needed. Total face to face encounter time was greater than 45 minutes and over half of this time was spent in counseling and/or coordination of care.   Follow-Up Instructions: Return if symptoms worsen or fail to improve.   Orders:  No orders of the defined types were placed in this encounter.  No orders of the defined types were placed in this encounter.     Procedures: No procedures performed   Clinical Data: No additional findings.   Subjective: Chief Complaint  Patient presents with  . Left Hip - Pain    HPI Samantha Gregory is a pleasant 84 year old female who presents our clinic today with left hip pain.  This has been ongoing for about 50 years.  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  At that point, she felt that it was from some sort of food allergy.  Her pain has recently become more consistent.  Pain she has is to the groin and radiates down the left leg.  When this happens, she notes significant abdominal bloating and the need to pass gas.  She denies any bowel or bladder incontinence during this time.  She notes that her pain subsides after about 2 hours and after passing a significant amount of gas.  She has previously been seen by her primary care provider as well as a GI doctor in the past.  Review of Systems as detailed in HPI.  All others reviewed and are  negative.   Objective: Vital Signs: Ht 5\' 5"  (1.651 m)   Wt 84 lb (38.1 kg)   BMI 13.98 kg/m   Physical Exam well-developed well-nourished female in no acute distress.  Alert and oriented x3.  Ortho Exam examination of her left hip reveals full active range of motion and strength.  She has a negative logroll.  Negative straight leg raise.  No focal weakness.  She is neurovascularly intact distally.  Specialty Comments:  No specialty comments available.  Imaging: Images reviewed by me in canopy of the left hip reveal mild to moderate degenerative changes   PMFS History: Patient Active Problem List   Diagnosis Date Noted  . Mitral regurgitation 07/09/2019  . Compression fracture of body of thoracic vertebra ([place]) 11/28/2018  . Dry skin dermatitis 11/19/2018  . De Quervain's tenosynovitis, left 06/14/2018  . Chemical induced allergic contact dermatitis 05/28/2018  . Squamous cell carcinoma, leg, right   . Closed compression fracture of L5 lumbar vertebra 06/03/2017  . Abnormal cervical Papanicolaou smear 06/03/2017  . Stenosis of cervix 06/03/2017  . Tachycardia 06/03/2017  . PVC's (premature ventricular contractions)   . Moderate aortic insufficiency 09/02/2015  . Essential hypertension 09/02/2015  . Low hemoglobin  12/11/2014  . Orthostatic hypotension 03/31/2014  . Hypercholesterolemia 06/20/2011  . Osteoporosis 06/20/2011   Past Medical History:  Diagnosis Date  . Essential hypertension   . Heart palpitations   . Hypercholesterolemia   . Mitral regurgitation   . Orthostatic hypotension   . Paroxysmal atrial tachycardia ([place])   . PVC's (premature ventricular contractions)   . Squamous cell carcinoma, leg, right   . Squamous cell carcinoma, leg, right     Family History  Problem Relation Age of Onset  . Heart attack Father   . Stroke Mother   . Kidney disease Mother   . Heart disease Brother   . Hypertension Brother   . Heart disease Sister   . Heart disease  Sister   . Stroke Paternal Uncle   . Stroke Paternal Aunt     Past Surgical History:  Procedure Laterality Date  . APPENDECTOMY    . TEE WITHOUT CARDIOVERSION N/A 09/08/2015   Procedure: TRANSESOPHAGEAL ECHOCARDIOGRAM (TEE);  Surgeon: Lelon Perla, MD;  Location: [facility] ENDOSCOPY;  Service: Cardiovascular;  Laterality: N/A;   Social History   Occupational History  . Not on file  Tobacco Use  . Smoking status: Never Smoker  . Smokeless tobacco: Never Used  Substance and Sexual Activity  . Alcohol use: Yes  . Drug use: No  . Sexual activity: Not on file

## 2019-08-20 NOTE — Telephone Encounter (Signed)
Patient called back to say she had no intention of wearing the monitor so that is why she sent it back.

## 2019-08-22 NOTE — Telephone Encounter (Signed)
Pt will not be wearing this monitor. Will cancel registration for the monitor and send unused monitor back to company per their request.

## 2019-08-27 ENCOUNTER — Ambulatory Visit: Payer: PPO | Admitting: Cardiology

## 2019-09-11 ENCOUNTER — Telehealth: Payer: Self-pay | Admitting: Family Medicine

## 2019-09-11 NOTE — Telephone Encounter (Signed)
Unless something changes, I would recommend a high-dose flu shot in mid October for her.  Thank you.

## 2019-09-11 NOTE — Telephone Encounter (Signed)
Copied from [place] [id number]. Topic: General - Other >> Sep 11, 2019  9:45 AM Lennox Solders wrote: Reason for CRM: pt has never had a flu shot and they are free and the people are giving flu shot in her area. Pt would like md recommendation

## 2019-09-11 NOTE — Telephone Encounter (Signed)
Called left message to call back 

## 2019-09-12 NOTE — Telephone Encounter (Signed)
Called informed the patient of PCP instructions. She will give this some thought still not sure if she wants one as never had one before.

## 2019-09-16 ENCOUNTER — Ambulatory Visit: Payer: PPO | Admitting: Family Medicine

## 2019-09-16 ENCOUNTER — Ambulatory Visit (INDEPENDENT_AMBULATORY_CARE_PROVIDER_SITE_OTHER): Payer: PPO | Admitting: Cardiology

## 2019-09-16 ENCOUNTER — Other Ambulatory Visit: Payer: Self-pay

## 2019-09-16 ENCOUNTER — Telehealth: Payer: Self-pay | Admitting: Family Medicine

## 2019-09-16 ENCOUNTER — Encounter: Payer: Self-pay | Admitting: Cardiology

## 2019-09-16 VITALS — BP 164/100 | HR 61 | Ht 65.0 in | Wt 87.0 lb

## 2019-09-16 DIAGNOSIS — I340 Nonrheumatic mitral (valve) insufficiency: Secondary | ICD-10-CM

## 2019-09-16 DIAGNOSIS — I10 Essential (primary) hypertension: Secondary | ICD-10-CM

## 2019-09-16 DIAGNOSIS — Z13220 Encounter for screening for lipoid disorders: Secondary | ICD-10-CM | POA: Diagnosis not present

## 2019-09-16 DIAGNOSIS — I351 Nonrheumatic aortic (valve) insufficiency: Secondary | ICD-10-CM | POA: Diagnosis not present

## 2019-09-16 NOTE — Telephone Encounter (Signed)
Patient came into the office on 09/16/2019.  PCP was given a note regarding ongoing left sided pain that started from 8:30 pm to 11:30 last evening (09/15/19). The patient stated she was cold and too painful to lie down, used a heat pad, took 2 tylenol 325 , used 2 suppositories, got no relief from the pain. She would like to look into the possibility of kidney stones as her mom has a history of them. The patient has been seen for this problem previously (08/20/2019) and does not think it is hip pain. She also has constant thirst. We did schedule her an appt. Immediately for today 09/16/2019 as she was in the building seeing another MD, but later she did reschedule (she did not want to wait) today's appt  for September 30, 2019 and will keep in contact if anything changes//worsens. Will send this message for PCP to review.

## 2019-09-16 NOTE — Progress Notes (Signed)
Cardiology Office Note:    Date:  09/16/2019   ID:  Samantha Gregory, [place] Aug 11, 1935, MRN [id number]  PCP:  [name], DO  Cardiologist:  Jenean Lindau, MD   Referring MD: [name]*    ASSESSMENT:    1. Essential hypertension   2. Moderate aortic insufficiency   3. Mitral valve insufficiency, unspecified etiology    PLAN:    In order of problems listed above:  1. Essential hypertension: I discussed my findings with the patient at extensive length.  Salt intake issues were discussed with her at length and she promises to comply.  She will buy a blood pressure machine and check her blood pressures twice a day.  She will see our nurse practitioner in about 2 weeks.  At that time she will bring her alert log of her blood pressures and we will see if she needs to go on any medications.  If she needs a blood pressure medicine I would go with amlodipine 2.5 mg daily to start with and she will keep a track of her blood pressures.  I would also like to check her blood pressure machine when she comes back so that she is keeping a correct track of these issues.  She will also have fasting blood work on that day. 2. Valvular heart disease: Medical therapy at this point.  Managing her blood pressure and optimizing should help her valvular heart disease.  Patient had multiple questions which were answered to her satisfaction.   Medication Adjustments/Labs and Tests Ordered: Current medicines are reviewed at length with the patient today.  Concerns regarding medicines are outlined above.  No orders of the defined types were placed in this encounter.  No orders of the defined types were placed in this encounter.    Chief Complaint  Patient presents with  . Follow-up    Echo     History of Present Illness:    Samantha Gregory is a 84 y.o. female.  Patient has past medical history of essential hypertension, significant mitral and aortic regurgitation.   She denies any problems at this time and takes care of activities of daily living.  No chest pain orthopnea or PND.  Her blood pressure significantly elevated today.  She is not very careful with sodium intake in her diet and this was detailed to her today.  Past Medical History:  Diagnosis Date  . Essential hypertension   . Heart palpitations   . Hypercholesterolemia   . Mitral regurgitation   . Orthostatic hypotension   . Paroxysmal atrial tachycardia ([place])   . PVC's (premature ventricular contractions)   . Squamous cell carcinoma, leg, right   . Squamous cell carcinoma, leg, right     Past Surgical History:  Procedure Laterality Date  . APPENDECTOMY    . TEE WITHOUT CARDIOVERSION N/A 09/08/2015   Procedure: TRANSESOPHAGEAL ECHOCARDIOGRAM (TEE);  Surgeon: Lelon Perla, MD;  Location: [facility] ENDOSCOPY;  Service: Cardiovascular;  Laterality: N/A;    Current Medications: No outpatient medications have been marked as taking for the 09/16/19 encounter (Office Visit) with Revankar, Reita Cliche, MD.     Allergies:   Statins, Diazolidinyl urea [urea], Gold sodium thiosulfate, and Isothiazolinone chloride   Social History   Socioeconomic History  . Marital status: Widowed    Spouse name: Not on file  . Number of children: Not on file  . Years of education: Not on file  . Highest education level: Not on file  Occupational History  .  Not on file  Social Needs  . Financial resource strain: Not on file  . Food insecurity    Worry: Not on file    Inability: Not on file  . Transportation needs    Medical: Not on file    Non-medical: Not on file  Tobacco Use  . Smoking status: Never Smoker  . Smokeless tobacco: Never Used  Substance and Sexual Activity  . Alcohol use: Yes  . Drug use: No  . Sexual activity: Not on file  Lifestyle  . Physical activity    Days per week: Not on file    Minutes per session: Not on file  . Stress: Not on file  Relationships  . Social Product manager on phone: Not on file    Gets together: Not on file    Attends religious service: Not on file    Active member of club or organization: Not on file    Attends meetings of clubs or organizations: Not on file    Relationship status: Not on file  Other Topics Concern  . Not on file  Social History Narrative  . Not on file     Family History: The patient's family history includes Heart attack in her father; Heart disease in her brother, sister, and sister; Hypertension in her brother; Kidney disease in her mother; Stroke in her mother, paternal aunt, and paternal uncle.  ROS:   Please see the history of present illness.    All other systems reviewed and are negative.  EKGs/Labs/Other Studies Reviewed:    The following studies were reviewed today: IMPRESSIONS    1. The left ventricle has normal systolic function, with an ejection fraction of 55-60%. The cavity size was normal. There is mild concentric left ventricular hypertrophy. Left ventricular diastolic Doppler parameters are indeterminate. No evidence of  left ventricular regional wall motion abnormalities.  2. The right ventricle has normal systolic function. The cavity was normal. There is no increase in right ventricular wall thickness.  3. The mitral valve is degenerative. Mild thickening of the mitral valve leaflet. Mitral valve regurgitation is mild to moderate by color flow Doppler. No evidence of mitral valve stenosis.  4. The aortic valve is tricuspid. Mild thickening of the aortic valve. Aortic valve regurgitation is mild to moderate by color flow Doppler. No stenosis of the aortic valve.  5. The aorta is normal in size and structure.  6. The aortic root and ascending aorta are normal in size and structure.   Recent Labs: 06/25/2019: ALT 19; BUN 24; Creatinine, Ser 0.78; Potassium 4.0; Sodium 142; TSH 3.58 07/05/2019: Hemoglobin 12.1; Platelets 233.0  Recent Lipid Panel    Component Value Date/Time   CHOL 253  (H) 01/14/2019 0736   TRIG 95 01/14/2019 0736   HDL 96 01/14/2019 0736   CHOLHDL 2.6 01/14/2019 0736   CHOLHDL 2.3 12/30/2015 0741   VLDL 17 12/30/2015 0741   LDLCALC 138 (H) 01/14/2019 0736   LDLDIRECT 117.0 02/05/2014 0812    Physical Exam:    VS:  BP (!) 164/100 (BP Location: Right Arm, Patient Position: Sitting, Cuff Size: Normal)   Pulse 61   Ht 5\' 5"  (1.651 m)   Wt 87 lb (39.5 kg)   SpO2 99%   BMI 14.48 kg/m     Wt Readings from Last 3 Encounters:  09/16/19 87 lb (39.5 kg)  08/20/19 84 lb (38.1 kg)  08/06/19 84 lb 4 oz (38.2 kg)     GEN: Patient  is in no acute distress HEENT: Normal NECK: No JVD; No carotid bruits LYMPHATICS: No lymphadenopathy CARDIAC: Hear sounds regular, 2/6 systolic murmur at the apex. RESPIRATORY:  Clear to auscultation without rales, wheezing or rhonchi  ABDOMEN: Soft, non-tender, non-distended MUSCULOSKELETAL:  No edema; No deformity  SKIN: Warm and dry NEUROLOGIC:  Alert and oriented x 3 PSYCHIATRIC:  Normal affect   Signed, Jenean Lindau, MD  09/16/2019 3:01 PM    [facility]

## 2019-09-16 NOTE — Telephone Encounter (Signed)
Noted  

## 2019-09-16 NOTE — Patient Instructions (Signed)
Medication Instructions:  Your physician recommends that you continue on your current medications as directed. Please refer to the Current Medication list given to you today.  If you need a refill on your cardiac medications before your next appointment, please call your pharmacy.   Lab work: Your physician recommends that you return FASTING a couple days prior to f/u for BMP, hepatic and lipid.  If you have labs (blood work) drawn today and your tests are completely normal, you will receive your results only by: Marland Kitchen MyChart Message (if you have MyChart) OR . A paper copy in the mail If you have any lab test that is abnormal or we need to change your treatment, we will call you to review the results.  Testing/Procedures: NONE  Follow-Up: At [facility], you and your health needs are our priority.  As part of our continuing mission to provide you with exceptional heart care, we have created designated Provider Care Teams.  These Care Teams include your primary Cardiologist (physician) and Advanced Practice Providers (APPs -  Physician Assistants and Nurse Practitioners) who all work together to provide you with the care you need, when you need it. You will need a follow up appointment in 2 weeks.

## 2019-09-26 DIAGNOSIS — I351 Nonrheumatic aortic (valve) insufficiency: Secondary | ICD-10-CM | POA: Diagnosis not present

## 2019-09-26 DIAGNOSIS — I340 Nonrheumatic mitral (valve) insufficiency: Secondary | ICD-10-CM | POA: Diagnosis not present

## 2019-09-26 DIAGNOSIS — Z13220 Encounter for screening for lipoid disorders: Secondary | ICD-10-CM | POA: Diagnosis not present

## 2019-09-26 DIAGNOSIS — I10 Essential (primary) hypertension: Secondary | ICD-10-CM | POA: Diagnosis not present

## 2019-09-27 LAB — LIPID PANEL
Chol/HDL Ratio: 2.7 ratio (ref 0.0–4.4)
Cholesterol, Total: 244 mg/dL — ABNORMAL HIGH (ref 100–199)
HDL: 89 mg/dL (ref >39)
LDL Chol Calc (NIH): 139 mg/dL — ABNORMAL HIGH (ref 0–99)
Triglycerides: 97 mg/dL (ref 0–149)
VLDL Cholesterol Cal: 16 mg/dL (ref 5–40)

## 2019-09-27 LAB — HEPATIC FUNCTION PANEL
ALT: 24 IU/L (ref 0–32)
AST: 35 IU/L (ref 0–40)
Albumin: 4.5 g/dL (ref 3.6–4.6)
Alkaline Phosphatase: 85 IU/L (ref 39–117)
Bilirubin Total: 0.7 mg/dL (ref 0.0–1.2)
Bilirubin, Direct: 0.2 mg/dL (ref 0.00–0.40)
Total Protein: 6.6 g/dL (ref 6.0–8.5)

## 2019-09-27 LAB — BASIC METABOLIC PANEL
BUN/Creatinine Ratio: 22 (ref 12–28)
BUN: 18 mg/dL (ref 8–27)
CO2: 28 mmol/L (ref 20–29)
Calcium: 9.8 mg/dL (ref 8.7–10.3)
Chloride: 99 mmol/L (ref 96–106)
Creatinine, Ser: 0.81 mg/dL (ref 0.57–1.00)
GFR calc Af Amer: 79 mL/min/{1.73_m2} (ref >59)
GFR calc non Af Amer: 68 mL/min/{1.73_m2} (ref >59)
Glucose: 80 mg/dL (ref 65–99)
Potassium: 4.1 mmol/L (ref 3.5–5.2)
Sodium: 139 mmol/L (ref 134–144)

## 2019-09-29 NOTE — Progress Notes (Signed)
Office Visit    Patient Name: Samantha Gregory Date of Encounter: 09/30/2019  Primary Care Provider:  [name], DO Primary Cardiologist:  Jenean Lindau, MD Electrophysiologist:  None   Chief Complaint    Samantha Gregory is a 84 y.o. female with a hx of HTN, palpitations (PAT, PVC), HLD, mitral and aortic regurgitation presents today for 2 week follow up after adjustment of anti-hypertensive regimen.    Past Medical History    Past Medical History:  Diagnosis Date  . Essential hypertension   . Heart palpitations   . Hypercholesterolemia   . Mitral regurgitation   . Orthostatic hypotension   . Paroxysmal atrial tachycardia ([place])   . PVC's (premature ventricular contractions)   . Squamous cell carcinoma, leg, right   . Squamous cell carcinoma, leg, right    Past Surgical History:  Procedure Laterality Date  . APPENDECTOMY    . TEE WITHOUT CARDIOVERSION N/A 09/08/2015   Procedure: TRANSESOPHAGEAL ECHOCARDIOGRAM (TEE);  Surgeon: Lelon Perla, MD;  Location: [facility] ENDOSCOPY;  Service: Cardiovascular;  Laterality: N/A;    Allergies  Allergies  Allergen Reactions  . Statins Anxiety  . Diazolidinyl Urea [Urea]   . Gold Sodium Thiosulfate   . Isothiazolinone Chloride     History of Present Illness    Samantha Gregory is a 84 y.o. female with a hx of HTN, palpitations (PAT, PVC), HLD, mitral and aortic regurgitation last seen 09/16/19 by Dr. Geraldo Pitter.  She brings a blood pressure log for my review.  States she has been checking every day after resting for a few minutes while sitting down.  Blood pressures are 115-150/54-85.  Heart rate routinely 55-61.  She has eliminated all salt from her diet, drinks no sodas.  Tells me she is worried that her heart rate is so low.  She denies dizziness, lightheadedness.  We discussed that this is likely a function of her petite size and high activity level.  She is working very hard on her home doing lots  of yard work, cutting down trees, Social research officer, government.  She has questions regarding her cholesterol levels checked at last office visit.  Tells me she previously many years ago was on medication to bring down cholesterol and she would have trouble remembering things.  She is agreeable to try a low-dose cholesterol medication a few times per week.  Emphasizes that she wants to do what is best for her health.  When discussing diet she tells me she does not eat red meat, fats, fried foods.  We discussed that the etiology of her elevated LDL was likely due to to genetic factors and age.  She denies chest pain, S OB, DOE, edema, palpitations.  She takes only an over-the-counter "bone health " vitamin and calcium.  She has stopped her calcium at the direction of her PCP as she has been having some constipation. EKGs/Labs/Other Studies Reviewed:   The following studies were reviewed today:  Echo 07/19/19  1. The left ventricle has normal systolic function, with an ejection fraction of 55-60%. The cavity size was normal. There is mild concentric left ventricular hypertrophy. Left ventricular diastolic Doppler parameters are indeterminate. No evidence of  left ventricular regional wall motion abnormalities.  2. The right ventricle has normal systolic function. The cavity was normal. There is no increase in right ventricular wall thickness.  3. The mitral valve is degenerative. Mild thickening of the mitral valve leaflet. Mitral valve regurgitation is mild to moderate by color flow  Doppler. No evidence of mitral valve stenosis.  4. The aortic valve is tricuspid. Mild thickening of the aortic valve. Aortic valve regurgitation is mild to moderate by color flow Doppler. No stenosis of the aortic valve.  5. The aorta is normal in size and structure.  6. The aortic root and ascending aorta are normal in size and structure  EKG: No EKG today.   Recent Labs: 06/25/2019: TSH 3.58 07/05/2019: Hemoglobin 12.1; Platelets 233.0  09/26/2019: ALT 24; BUN 18; Creatinine, Ser 0.81; Potassium 4.1; Sodium 139  Recent Lipid Panel    Component Value Date/Time   CHOL 244 (H) 09/26/2019 0806   TRIG 97 09/26/2019 0806   HDL 89 09/26/2019 0806   CHOLHDL 2.7 09/26/2019 0806   CHOLHDL 2.3 12/30/2015 0741   VLDL 17 12/30/2015 0741   LDLCALC 139 (H) 09/26/2019 0806   LDLDIRECT 117.0 02/05/2014 0812    Home Medications   No outpatient medications have been marked as taking for the 09/30/19 encounter (Office Visit) with Loel Dubonnet, NP.     Review of Systems      Review of Systems  Constitution: Negative for chills, fever and malaise/fatigue.  Cardiovascular: Negative for chest pain, dyspnea on exertion, irregular heartbeat, leg swelling, near-syncope, orthopnea, palpitations and syncope.  Respiratory: Negative for cough, shortness of breath and wheezing.   Musculoskeletal: Positive for back pain.  Gastrointestinal: Positive for constipation. Negative for nausea and vomiting.  Neurological: Negative for dizziness, light-headedness and weakness.   All other systems reviewed and are otherwise negative except as noted above.  Physical Exam    VS:  BP 138/80 (BP Location: Left Arm, Patient Position: Sitting, Cuff Size: Small)   Pulse 65   Resp 20   Wt 86 lb 4 oz (39.1 kg)   SpO2 100%   BMI 14.35 kg/m  , BMI Body mass index is 14.35 kg/m. GEN: Well nourished, well developed, in no acute distress. HEENT: normal. Neck: Supple, no JVD, carotid bruits, or masses. Cardiac: RRR, no murmurs, rubs, or gallops. No clubbing, cyanosis, edema.  Radials/DP/PT 2+ and equal bilaterally.  Respiratory:  Respirations regular and unlabored, clear to auscultation bilaterally. GI: Soft, nontender, nondistended, BS + x 4. MS: No deformity or atrophy. Skin: Warm and dry, no rash. Neuro:  Strength and sensation are intact. Psych: Normal affect.  Assessment & Plan    1. HTN -Checking blood pressure at home with readings averaging  120s/60s.  No indication for antihypertensive therapy at this time.  Discussed that blood pressure can be labile based on activity, eating, stress.  Some element of whitecoat hypertension as her blood pressure is consistently higher in the office than it is at home.  She will call our office if her SBP is consistently >130.  She has cut salt out of her diet and was encouraged to continue.  2. HLD - Lipid panel 09/26/2019 with total cholesterol 244, triglycerides 97, HDL 89, LDL 139.  In absence of CAD would set LDL goal less than 100.  She is agreeable to start atorvastatin 10 mg 3 times per week.  Previous intolerance with noted "anxiety "and "memory impairment ".  These are not typical side effects of statin but will monitor carefully.  Repeat lipid panel and CMP in 6 weeks.  3. Mild to moderate aortic regurgitation - Echo 06/2019. Asymptomatic, continue monitoring, control of BP.  4. Mid to moderate mitral regurgitation - Echo 06/2019.  Asymptomatic, Continue monitoring, control of BP.  5. Palpitations - Previous Holter monitor  with PVC, PAT. Denies recurrence.  Continue to avoid proarrhythmic medications as well as caffeine.  6. Bilateral carotid stenosis - Duplex 06/2018 with R and L ICA 1-39% stenosis bilaterally. She has an intolerance of statin with noted reaction of 'anxiety'.  Trial low intensity statin 3 times per week, as above.  Disposition: Lipid profile and CMP in 6 weeks.  Follow up in 2 month(s) with Dr. Geraldo Pitter.   Loel Dubonnet, NP 09/30/2019, 11:04 AM

## 2019-09-30 ENCOUNTER — Ambulatory Visit (INDEPENDENT_AMBULATORY_CARE_PROVIDER_SITE_OTHER): Payer: PPO | Admitting: Family

## 2019-09-30 ENCOUNTER — Ambulatory Visit (INDEPENDENT_AMBULATORY_CARE_PROVIDER_SITE_OTHER): Payer: PPO | Admitting: Family Medicine

## 2019-09-30 ENCOUNTER — Encounter: Payer: Self-pay | Admitting: Family Medicine

## 2019-09-30 ENCOUNTER — Other Ambulatory Visit: Payer: Self-pay

## 2019-09-30 ENCOUNTER — Ambulatory Visit: Payer: PPO | Admitting: Family

## 2019-09-30 ENCOUNTER — Encounter: Payer: Self-pay | Admitting: Family

## 2019-09-30 VITALS — BP 122/86 | HR 77 | Temp 97.7°F | Ht 65.0 in | Wt 86.4 lb

## 2019-09-30 VITALS — BP 138/80 | HR 65 | Resp 20 | Wt 86.2 lb

## 2019-09-30 DIAGNOSIS — R030 Elevated blood-pressure reading, without diagnosis of hypertension: Secondary | ICD-10-CM | POA: Diagnosis not present

## 2019-09-30 DIAGNOSIS — I6523 Occlusion and stenosis of bilateral carotid arteries: Secondary | ICD-10-CM

## 2019-09-30 DIAGNOSIS — E7800 Pure hypercholesterolemia, unspecified: Secondary | ICD-10-CM | POA: Diagnosis not present

## 2019-09-30 DIAGNOSIS — I340 Nonrheumatic mitral (valve) insufficiency: Secondary | ICD-10-CM

## 2019-09-30 DIAGNOSIS — I10 Essential (primary) hypertension: Secondary | ICD-10-CM | POA: Diagnosis not present

## 2019-09-30 DIAGNOSIS — M533 Sacrococcygeal disorders, not elsewhere classified: Secondary | ICD-10-CM | POA: Diagnosis not present

## 2019-09-30 DIAGNOSIS — M25552 Pain in left hip: Secondary | ICD-10-CM

## 2019-09-30 DIAGNOSIS — I351 Nonrheumatic aortic (valve) insufficiency: Secondary | ICD-10-CM

## 2019-09-30 DIAGNOSIS — Z79899 Other long term (current) drug therapy: Secondary | ICD-10-CM | POA: Diagnosis not present

## 2019-09-30 HISTORY — DX: Occlusion and stenosis of bilateral carotid arteries: I65.23

## 2019-09-30 HISTORY — DX: Pain in left hip: M25.552

## 2019-09-30 MED ORDER — ATORVASTATIN CALCIUM 10 MG PO TABS: ORAL_TABLET | ORAL | 2 refills | Status: DC

## 2019-09-30 NOTE — Progress Notes (Signed)
Musculoskeletal Exam  Patient: Samantha Gregory DOB: 1935-06-16  DOS: 09/30/2019  SUBJECTIVE:  Chief Complaint:   Chief Complaint  Patient presents with  . Flank Pain    Samantha Gregory is a 84 y.o.  female for evaluation and treatment of L hip pain.    Onset:  3 hr episode of L groin pain. She is very active in the yard despite recs Location:  Character:  sharp  Progression of issue:  Feels fine now Associated symptoms: none Treatment: to date has been rest, ice and acetaminophen.   Neurovascular symptoms: no  Has been dealing with elevated BP. Checking BP at home, running 120-130s/80's on average. Sees cardiology. Has cut down on Na and has seen a drop in her pressures. No lightheadedness or CP.   ROS: Musculoskeletal/Extremities: +L hip pain Cardiac: no CP  Past Medical History:  Diagnosis Date  . Essential hypertension   . Heart palpitations   . Hypercholesterolemia   . Mitral regurgitation   . Orthostatic hypotension   . Paroxysmal atrial tachycardia ([place])   . PVC's (premature ventricular contractions)   . Squamous cell carcinoma, leg, right   . Squamous cell carcinoma, leg, right     Objective: VITAL SIGNS: BP 122/86 (BP Location: Left Arm, Patient Position: Sitting, Cuff Size: Small)   Pulse 77   Temp 97.7 F (36.5 C) (Temporal)   Ht 5\' 5"  (1.651 m)   Wt 86 lb 7 oz (39.2 kg)   SpO2 95%   BMI 14.38 kg/m  Constitutional: Well formed, well developed. No acute distress. Cardiovascular: RRR, Brisk cap refill Thorax & Lungs: No accessory muscle use Musculoskeletal: L hip.   Normal active range of motion: yes.   Normal passive range of motion: yes Tenderness to palpation: there is TTP over the L SI jt Deformity: no Ecchymosis: no Tests positive: none Tests negative: log roll, FABER/FADDIR Neurologic: Normal sensory function. No focal deficits noted. DTR's equal and symmetric in LE's. No clonus. Psychiatric: Normal mood. Age appropriate  judgment and insight. Alert & oriented x 3.    Assessment:  Left hip pain - Plan: Ambulatory referral to Physical Therapy  SI (sacroiliac) joint dysfunction - Plan: Ambulatory referral to Physical Therapy  Elevated blood pressure reading  Plan: 1/2- trial PT. She needs to take it a bit easier with her yardwork, but I don't think she will do this. Could be radiating from Samantha Gregory, if no improvement or if PT is too expensive, will consider injection. XR and ortho eval largely unremarkable.  3- Recheck is normal. Monitor Na intake. F/u w cards.  F/u prn w me. The patient voiced understanding and agreement to the plan.   [place], DO 09/30/19  10:09 AM

## 2019-09-30 NOTE — Patient Instructions (Signed)
Heat (pad or rice pillow in microwave) over affected area, 10-15 minutes twice daily.   Ice/cold pack over area for 10-15 min twice daily.  OK to take Tylenol 1000 mg (2 extra strength tabs) or 975 mg (3 regular strength tabs) every 6 hours as needed.  If you do not hear anything about your referral in the next 1-2 weeks, call our office and ask for an update. If the PT is too expensive, let us know and we can look into injections.   Let us know if you need anything.

## 2019-09-30 NOTE — Patient Instructions (Addendum)
Medication Instructions:   Your physician has recommended you make the following change in your medication:  START Atorvastatin 10 mg (one tablet) three times per week.  If you need a refill on your cardiac medications before your next appointment, please call your pharmacy.   Lab work: No lab work today.   Your physician recommends that you return for lab work in 6 weeks: lipid profile, CMET Stop by the [facility] anytime 8am - 4:30pm the week of November 9th-13th. You do not need an appointment. Please be fasting for this lab. Would avoid the 12p-1p hour for lunch.  If you have labs (blood work) drawn today and your tests are completely normal, you will receive your results only by: Marland Kitchen MyChart Message (if you have MyChart) OR . A paper copy in the mail If you have any lab test that is abnormal or we need to change your treatment, we will call you to review the results.  Testing/Procedures: No testing ordered today.   Follow-Up: At [facility], you and your health needs are our priority.  As part of our continuing mission to provide you with exceptional heart care, we have created designated Provider Care Teams.  These Care Teams include your primary Cardiologist (physician) and Advanced Practice Providers (APPs -  Physician Assistants and Nurse Practitioners) who all work together to provide you with the care you need, when you need it. . Follow up with Dr. Geraldo Pitter in 2 months.   Any Other Special Instructions Will Be Listed Below (If Applicable).    Fat and Cholesterol Restricted Eating Plan Getting too much fat and cholesterol in your diet may cause health problems. Choosing the right foods helps keep your fat and cholesterol at normal levels. This can keep you from getting certain diseases.  What are tips for following this plan? Meal planning  At meals, divide your plate into four equal parts: ? Fill one-half of your plate with vegetables and green salads. ? Fill  one-fourth of your plate with whole grains. ? Fill one-fourth of your plate with low-fat (lean) protein foods.  Eat fish that is high in omega-3 fats at least two times a week. This includes mackerel, tuna, sardines, and salmon.  Eat foods that are high in fiber, such as whole grains, beans, apples, broccoli, carrots, peas, and barley. General tips   Work with your doctor to lose weight if you need to.  Avoid: ? Foods with added sugar. ? Fried foods. ? Foods with partially hydrogenated oils.  Limit alcohol intake to no more than 1 drink a day for nonpregnant women and 2 drinks a day for men. One drink equals 12 oz of beer, 5 oz of wine, or 1 oz of hard liquor. Reading food labels  Check food labels for: ? Trans fats. ? Partially hydrogenated oils. ? Saturated fat (g) in each serving. ? Cholesterol (mg) in each serving. ? Fiber (g) in each serving.  Choose foods with healthy fats, such as: ? Monounsaturated fats. ? Polyunsaturated fats. ? Omega-3 fats.  Choose grain products that have whole grains. Look for the word "whole" as the first word in the ingredient list. Cooking  Cook foods using low-fat methods. These include baking, boiling, grilling, and broiling.  Eat more home-cooked foods. Eat at restaurants and buffets less often.  Avoid cooking using saturated fats, such as butter, cream, palm oil, palm kernel oil, and coconut oil. Recommended foods  Fruits  All fresh, canned (in natural juice), or frozen fruits.  Vegetables  Fresh or frozen vegetables (raw, steamed, roasted, or grilled). Green salads. Grains  Whole grains, such as whole wheat or whole grain breads, crackers, cereals, and pasta. Unsweetened oatmeal, bulgur, barley, quinoa, or brown rice. Corn or whole wheat flour tortillas. Meats and other protein foods  Ground beef (85% or leaner), grass-fed beef, or beef trimmed of fat. Skinless chicken or [place]. Ground chicken or [place]. Pork trimmed of fat.  All fish and seafood. Egg whites. Dried beans, peas, or lentils. Unsalted nuts or seeds. Unsalted canned beans. Nut butters without added sugar or oil. Dairy  Low-fat or nonfat dairy products, such as skim or 1% milk, 2% or reduced-fat cheeses, low-fat and fat-free ricotta or cottage cheese, or plain low-fat and nonfat yogurt. Fats and oils  Tub margarine without trans fats. Light or reduced-fat mayonnaise and salad dressings. Avocado. Olive, canola, sesame, or safflower oils. The items listed above may not be a complete list of foods and beverages you can eat. Contact a dietitian for more information. Foods to avoid Fruits  Canned fruit in heavy syrup. Fruit in cream or butter sauce. Fried fruit. Vegetables  Vegetables cooked in cheese, cream, or butter sauce. Fried vegetables. Grains  White bread. White pasta. White rice. Cornbread. Bagels, pastries, and croissants. Crackers and snack foods that contain trans fat and hydrogenated oils. Meats and other protein foods  Fatty cuts of meat. Ribs, chicken wings, bacon, sausage, bologna, salami, chitterlings, fatback, hot dogs, bratwurst, and packaged lunch meats. Liver and organ meats. Whole eggs and egg yolks. Chicken and [place] with skin. Fried meat. Dairy  Whole or 2% milk, cream, half-and-half, and cream cheese. Whole milk cheeses. Whole-fat or sweetened yogurt. Full-fat cheeses. Nondairy creamers and whipped toppings. Processed cheese, cheese spreads, and cheese curds. Beverages  Alcohol. Sugar-sweetened drinks such as sodas, lemonade, and fruit drinks. Fats and oils  Butter, stick margarine, lard, shortening, ghee, or bacon fat. Coconut, palm kernel, and palm oils. Sweets and desserts  Corn syrup, sugars, honey, and molasses. Candy. Jam and jelly. Syrup. Sweetened cereals. Cookies, pies, cakes, donuts, muffins, and ice cream. The items listed above may not be a complete list of foods and beverages you should avoid. Contact a  dietitian for more information. Summary  Choosing the right foods helps keep your fat and cholesterol at normal levels. This can keep you from getting certain diseases.  At meals, fill one-half of your plate with vegetables and green salads.  Eat high-fiber foods, like whole grains, beans, apples, carrots, peas, and barley.  Limit added sugar, saturated fats, alcohol, and fried foods. This information is not intended to replace advice given to you by your health care provider. Make sure you discuss any questions you have with your health care provider. Document Released: 06/12/2012 Document Revised: 08/15/2018 Document Reviewed: 08/29/2017 Elsevier Patient Education  2020 [employer].

## 2019-10-02 ENCOUNTER — Telehealth: Payer: Self-pay

## 2019-10-02 NOTE — Telephone Encounter (Signed)
I would do PT first.

## 2019-10-02 NOTE — Telephone Encounter (Signed)
Copied from [place] [id number]. Topic: General - Inquiry >> Oct 01, 2019  4:14 PM Virl Axe D wrote: Reason for CRM: Pt stated she has touched base with PT and would like to talk to Dr. Nani Ravens about the plan and if she should go with PT or injection for her hip.

## 2019-10-02 NOTE — Telephone Encounter (Signed)
She does not have the time for PT//plus $15 every trip to PT. 3 times per week is not cheap///plus her time

## 2019-10-04 ENCOUNTER — Telehealth: Payer: Self-pay

## 2019-10-04 NOTE — Telephone Encounter (Signed)
-----   Message from Jenean Lindau, MD sent at 09/27/2019  8:23 AM EDT ----- Low-cholesterol diet.  Lipitor 10 mg daily and liver lipid check in 6 weeks. Jenean Lindau, MD 09/27/2019 8:23 AM

## 2019-10-04 NOTE — Telephone Encounter (Signed)
Left message for patient to call office for results. °

## 2019-10-07 ENCOUNTER — Other Ambulatory Visit: Payer: Self-pay | Admitting: Family Medicine

## 2019-10-07 ENCOUNTER — Encounter: Payer: Self-pay | Admitting: Family Medicine

## 2019-10-07 ENCOUNTER — Other Ambulatory Visit: Payer: Self-pay

## 2019-10-07 ENCOUNTER — Ambulatory Visit (INDEPENDENT_AMBULATORY_CARE_PROVIDER_SITE_OTHER): Payer: PPO | Admitting: Family Medicine

## 2019-10-07 VITALS — BP 118/82 | HR 73 | Temp 97.9°F | Ht 65.0 in | Wt 87.4 lb

## 2019-10-07 DIAGNOSIS — M25532 Pain in left wrist: Secondary | ICD-10-CM

## 2019-10-07 DIAGNOSIS — D489 Neoplasm of uncertain behavior, unspecified: Secondary | ICD-10-CM | POA: Diagnosis not present

## 2019-10-07 DIAGNOSIS — L570 Actinic keratosis: Secondary | ICD-10-CM | POA: Diagnosis not present

## 2019-10-07 DIAGNOSIS — L308 Other specified dermatitis: Secondary | ICD-10-CM | POA: Diagnosis not present

## 2019-10-07 NOTE — Progress Notes (Signed)
Chief Complaint  Patient presents with  . Follow-up    places on legs to check  . Constipation    Samantha Gregory is a 84 y.o. female here for a skin complaint.  Duration: several weeks Location: upper lip, R hand/forearm, R shin Pruritic? Yes Painful? No Drainage? No New soaps/lotions/topicals/detergents? No Sick contacts? No Other associated symptoms: scaling +hx of scc Therapies tried thus far: Vaseline  ROS:  Const: No fevers Skin: As noted in HPI  Past Medical History:  Diagnosis Date  . Essential hypertension   . Heart palpitations   . Hypercholesterolemia   . Mitral regurgitation   . Orthostatic hypotension   . Paroxysmal atrial tachycardia ([place])   . PVC's (premature ventricular contractions)   . Squamous cell carcinoma, leg, right   . Squamous cell carcinoma, leg, right     BP 118/82 (BP Location: Left Arm, Patient Position: Sitting, Cuff Size: Normal)   Pulse 73   Temp 97.9 F (36.6 C) (Temporal)   Ht 5\' 5"  (1.651 m)   Wt 87 lb 6 oz (39.6 kg)   SpO2 97%   BMI 14.54 kg/m  Gen: awake, alert, appearing stated age Lungs: No accessory muscle use MSK:  Skin: scaly and slightly raised lesion on the RLE measuring 0.8 cm x 0.4 cm. There area flat and scaly lesions on R distal forearm and R hand, both posteriorly. There is a scaly and mildly erythematous area on the L upper lip. Psych: Age appropriate judgment and insight  Procedure note; shave biopsy Informed consent was obtained. The area was cleaned with alcohol and injected with 0.5 mL of 1% lidocaine with epinephrine. A Dermablade was slightly bent and used to cut under the area of interest. The specimen was placed in a sterile specimen cup and sent to the lab. The area was then cauterized ensuring adequate hemostasis. The area was dressed with triple antibiotic ointment and a bandage. There were no complications noted. The patient tolerated the procedure well.  Procedure note:  cryotherapy Verbal consent obtained 3 skin lesions treated Liquid nitrogen was applied via a thin spray creating an ice ball with 1-2 mm corona surrounding the lesion The patient tolerated the procedure well There were no immediate complications noted  Neoplasm of uncertain behavior - Plan: Dermatology pathology([place])  Actinic keratoses - Plan: PR DESTRUCTION BENIGN LESIONS 15 OR MORE  Left wrist pain  1- aftercare instructions verbalized and written down. Concern for another SCC. 2- cryotherapy today. F/u in 4 weeks if no better and will consider biopsy. 3- cont w brace, stretches/exercises. If no improvement by end of autumn, will refer to sports med. Pt opted to wait rather than proceed now as she has many things she would like to do prior to winter.  F/u prn. The patient voiced understanding and agreement to the plan.  [place], DO 10/07/19 8:49 AM

## 2019-10-07 NOTE — Patient Instructions (Addendum)
Continue wearing the splint at night and during activity. If no improvement over next few weeks/months (until you finish up before winter), let me know and we can set you up with our sports medicine team.  Do not shower for the rest of the day. When you do wash it, use only soap and water. Do not vigorously scrub. Apply triple antibiotic ointment (like Neosporin) twice daily. Keep the area clean and dry.   Things to look out for: increasing pain not relieved by ibuprofen/acetaminophen, fevers, spreading redness, drainage of pus, or foul odor.   Wrist and Forearm Exercises Do exercises exactly as told by your health care provider and adjust them as directed. It is normal to feel mild stretching, pulling, tightness, or discomfort as you do these exercises, but you should stop right away if you feel sudden pain or your pain gets worse.   RANGE OF MOTION EXERCISES These exercises warm up your muscles and joints and improve the movement and flexibility of your injured wrist and forearm. These exercises also help to relieve pain, numbness, and tingling. These exercises are done using the muscles in your injured wrist and forearm. Exercise A: Wrist Flexion, Active 1. With your fingers relaxed, bend your wrist forward as far as you can. 2. Hold this position for 30 seconds. Repeat 2 times. Complete this exercise 3 times per week. Exercise B: Wrist Extension, Active 1. With your fingers relaxed, bend your wrist backward as far as you can. 2. Hold this position for 30 seconds. Repeat 2 times. Complete this exercise 3 times per week. Exercise C: Supination, Active  1. Stand or sit with your arms at your sides. 2. Bend your left / right elbow to an "L" shape (90 degrees). 3. Turn your palm upward until you feel a gentle stretch on the inside of your forearm. 4. Hold this position for 30 seconds. 5. Slowly return your palm to the starting position. Repeat 2 times. Complete this exercise 3 times per  week. Exercise D: Pronation, Active  1. Stand or sit with your arms at your sides. 2. Bend your left / right elbow to an "L" shape (90 degrees). 3. Turn your palm downward until you feel a gentle stretch on the top of your forearm. 4. Hold this position for 30 seconds. 5. Slowly return your palm to the starting position. Repeat 2 times. Complete this exercise once a day.  STRETCHING EXERCISES These exercises warm up your muscles and joints and improve the movement and flexibility of your injured wrist and forearm. These exercises also help to relieve pain, numbness, and tingling. These exercises are done using your healthy wrist and forearm to help stretch the muscles in your injured wrist and forearm. Exercise E: Wrist Flexion, Passive  1. Extend your left / right arm in front of you, relax your wrist, and point your fingers downward. 2. Gently push on the back of your hand. Stop when you feel a gentle stretch on the top of your forearm. 3. Hold this position for 30 seconds. Repeat 2 times. Complete this exercise 3 times per week. Exercise F: Wrist Extension, Passive  1. Extend your left / right arm in front of you and turn your palm upward. 2. Gently pull your palm and fingertips back so your fingers point downward. You should feel a gentle stretch on the palm-side of your forearm. 3. Hold this position for 30 seconds. Repeat 2 times. Complete this exercise 3 times per week. Exercise G: Forearm Rotation, Supination, Passive  1. Sit with your left / right elbow bent to an "L" shape (90 degrees) with your forearm resting on a table. 2. Keeping your upper body and shoulder still, use your other hand to rotate your forearm palm-up until you feel a gentle to moderate stretch. 3. Hold this position for 30 seconds. 4. Slowly release the stretch and return to the starting position. Repeat 2 times. Complete this exercise 3 times per week. Exercise H: Forearm Rotation, Pronation, Passive 1. Sit  with your left / right elbow bent to an "L" shape (90 degrees) with your forearm resting on a table. 2. Keeping your upper body and shoulder still, use your other hand to rotate your forearm palm-down until you feel a gentle to moderate stretch. 3. Hold this position for 30 seconds. 4. Slowly release the stretch and return to the starting position. Repeat 2 times. Complete this exercise 3 times per week.  STRENGTHENING EXERCISES These exercises build strength and endurance in your wrist and forearm. Endurance is the ability to use your muscles for a long time, even after they get tired. Exercise I: Wrist Flexors  1. Sit with your left / right forearm supported on a table and your hand resting palm-up over the edge of the table. Your elbow should be bent to an "L" shape (about 90 degrees) and be below the level of your shoulder. 2. Hold a 3-5 lb weight in your left / right hand. Or, hold a rubber exercise band or tube in both hands, keeping your hands at the same level and hip distance apart. There should be a slight tension in the exercise band or tube. 3. Slowly curl your hand up toward your forearm. 4. Hold this position for 3 seconds. 5. Slowly lower your hand back to the starting position. Repeat 2 times. Complete this exercise 3 times per week. Exercise J: Wrist Extensors  1. Sit with your left / right forearm supported on a table and your hand resting palm-down over the edge of the table. Your elbow should be bent to an "L" shape (about 90 degrees) and be below the level of your shoulder. 2. Hold a 3-5 lb weight in your left / right hand. Or, hold a rubber exercise band or tube in both hands, keeping your hands at the same level and hip distance apart. There should be a slight tension in the exercise band or tube. 3. Slowly curl your hand up toward your forearm. 4. Hold this position for 3 seconds. 5. Slowly lower your hand back to the starting position. Repeat 2 times. Complete this  exercise 3 times per week. Exercise K: Forearm Rotation, Supination  1. Sit with your left / right forearm supported on a table and your hand resting palm-down. Your elbow should be at your side, bent to an "L" shape (about 90 degrees), and below the level of your shoulder. Keep your wrist stable and in a neutral position throughout the exercise. 2. Gently hold a lightweight hammer with your left / right hand. 3. Without moving your elbow or wrist, slowly rotate your palm upward to a thumbs-up position. 4. Hold this position for 3 seconds. 5. Slowly return your forearm to the starting position. Repeat 2 times. Complete this exercise 3 times per week. Exercise L: Forearm Rotation, Pronation  1. Sit with your left / right forearm supported on a table and your hand resting palm-up. Your elbow should be at your side, bent to an "L" shape (about 90 degrees), and below  the level of your shoulder. Keep your wrist stable. Do not allow it to move backward or forward during the exercise. 2. Gently hold a lightweight hammer with your left / right hand. 3. Without moving your elbow or wrist, slowly rotate your palm and hand upward to a thumbs-up position. 4. Hold this position for 3 seconds. 5. Slowly return your forearm to the starting position. Repeat 2 times. Complete this exercise 3 times per week. Exercise M: Grip Strengthening  1. Hold one of these items in your left / right hand: play dough, therapy putty, a dense sponge, a stress ball, or a large, rolled sock. 2. Squeeze as hard as you can without increasing pain. 3. Hold this position for 5 seconds. 4. Slowly release your grip. Repeat 2 times. Complete this exercise 3 times per week.  This information is not intended to replace advice given to you by your health care provider. Make sure you discuss any questions you have with your health care provider. Document Released: 10/26/2005 Document Revised: 09/05/2016 Document Reviewed:  09/06/2015 Elsevier Interactive Patient Education  [employer].

## 2019-10-15 NOTE — Telephone Encounter (Signed)
Left 2nd message for patient to call office for lab results.

## 2019-10-16 NOTE — Telephone Encounter (Signed)
Patient states her PCP gave her results

## 2019-11-04 DIAGNOSIS — Z79899 Other long term (current) drug therapy: Secondary | ICD-10-CM | POA: Diagnosis not present

## 2019-11-04 DIAGNOSIS — E7800 Pure hypercholesterolemia, unspecified: Secondary | ICD-10-CM | POA: Diagnosis not present

## 2019-11-05 ENCOUNTER — Telehealth: Payer: Self-pay

## 2019-11-05 LAB — COMPREHENSIVE METABOLIC PANEL
ALT: 24 IU/L (ref 0–32)
AST: 37 IU/L (ref 0–40)
Albumin/Globulin Ratio: 1.8 (ref 1.2–2.2)
Albumin: 4.2 g/dL (ref 3.6–4.6)
Alkaline Phosphatase: 81 IU/L (ref 39–117)
BUN/Creatinine Ratio: 29 — ABNORMAL HIGH (ref 12–28)
BUN: 20 mg/dL (ref 8–27)
Bilirubin Total: 0.8 mg/dL (ref 0.0–1.2)
CO2: 26 mmol/L (ref 20–29)
Calcium: 9.3 mg/dL (ref 8.7–10.3)
Chloride: 103 mmol/L (ref 96–106)
Creatinine, Ser: 0.7 mg/dL (ref 0.57–1.00)
GFR calc Af Amer: 94 mL/min/{1.73_m2} (ref >59)
GFR calc non Af Amer: 82 mL/min/{1.73_m2} (ref >59)
Globulin, Total: 2.3 g/dL (ref 1.5–4.5)
Glucose: 83 mg/dL (ref 65–99)
Potassium: 3.9 mmol/L (ref 3.5–5.2)
Sodium: 141 mmol/L (ref 134–144)
Total Protein: 6.5 g/dL (ref 6.0–8.5)

## 2019-11-05 LAB — LIPID PANEL
Chol/HDL Ratio: 2.5 ratio (ref 0.0–4.4)
Cholesterol, Total: 219 mg/dL — ABNORMAL HIGH (ref 100–199)
HDL: 87 mg/dL (ref >39)
LDL Chol Calc (NIH): 121 mg/dL — ABNORMAL HIGH (ref 0–99)
Triglycerides: 65 mg/dL (ref 0–149)
VLDL Cholesterol Cal: 11 mg/dL (ref 5–40)

## 2019-11-05 NOTE — Telephone Encounter (Signed)
Phoned patient and gave recent lab results. States she took the atorvastatin as ordered and finished it, is not taking any more.

## 2019-11-06 ENCOUNTER — Telehealth: Payer: Self-pay

## 2019-11-06 NOTE — Telephone Encounter (Signed)
Please send refill for Atorvastatin 10mg  three times per week 90 day supply with 3 refills. Call patient to let her know she needs to continue taking. Thank!

## 2019-11-06 NOTE — Telephone Encounter (Signed)
Phoned patient, informed that provider would like her to continue atorvastatin three times a week. She states she has terrible side effects to all medications and couldn't tolerate atorvastatin, it made her sick and she "couldn't think straight".  States she won't take it and will talk to Dr.at upcoming appt in December 2020.

## 2019-11-08 NOTE — Telephone Encounter (Signed)
Duplicate- see [id number] Note

## 2019-12-05 ENCOUNTER — Telehealth: Payer: Self-pay | Admitting: Cardiology

## 2019-12-05 NOTE — Telephone Encounter (Signed)
Pt. Would like to be called back regarding her lab test results. She says she would like a copy to keep in her files

## 2019-12-06 ENCOUNTER — Other Ambulatory Visit: Payer: Self-pay | Admitting: Family Medicine

## 2019-12-13 ENCOUNTER — Telehealth: Payer: Self-pay | Admitting: Family Medicine

## 2019-12-13 NOTE — Telephone Encounter (Signed)
Spoke to the patient and would like dupixent called in to her pharmacy. She is scratching all night//legs getting raw from all the scratching. She wanted to know if you approve this and would send in?

## 2019-12-13 NOTE — Telephone Encounter (Signed)
Could try some Claritin (loratadine) daily. This is over the counter. Ty.

## 2019-12-13 NOTE — Telephone Encounter (Signed)
She wants to know if there is an alternative//orally to dry it up? She is using the ointment PCP prescribed

## 2019-12-13 NOTE — Telephone Encounter (Signed)
I don't prescribe that medication. She should contact her dermatologist for this one. Ty.

## 2019-12-13 NOTE — Telephone Encounter (Signed)
Called left message to call back 

## 2019-12-13 NOTE — Telephone Encounter (Signed)
Copied from [place] [id number]. Topic: General - Other >> Dec 13, 2019  8:24 AM Keene Breath wrote: Reason for CRM: Patient called to talk with the doctor or nurse regarding taking a new medicine, dupixant, for Eczema.  Please advise and call patient to discuss at [phone]

## 2019-12-16 ENCOUNTER — Ambulatory Visit: Payer: PPO | Admitting: Cardiology

## 2019-12-16 NOTE — Telephone Encounter (Signed)
Called informed the patient of PCP instructions. She verbalized understanding Mailed her a copy of most recent lab results. Also she saw advertised premagen on the TV and wondered if this would be helpful to her.

## 2019-12-16 NOTE — Telephone Encounter (Signed)
Daily lotion and add Claritin daily for itching. That should help. If it doesn't, I will see her in the office. Ty.

## 2019-12-16 NOTE — Telephone Encounter (Signed)
She has taken this for allergies, but does not think it helps anything else. Offered her an appointment but she declined both in the office and virtual. She is just wanting something to dry this up?

## 2019-12-28 ENCOUNTER — Other Ambulatory Visit: Payer: Self-pay | Admitting: Family

## 2019-12-30 ENCOUNTER — Telehealth: Payer: Self-pay | Admitting: Family Medicine

## 2019-12-30 ENCOUNTER — Other Ambulatory Visit: Payer: Self-pay | Admitting: Family Medicine

## 2019-12-30 MED ORDER — ATORVASTATIN CALCIUM 10 MG PO TABS: ORAL_TABLET | ORAL | 2 refills | Status: DC

## 2019-12-30 NOTE — Telephone Encounter (Signed)
That's fine

## 2019-12-30 NOTE — Telephone Encounter (Signed)
Copied from [place] [id number]. Topic: Quick Communication - Rx Refill/Question >> Dec 30, 2019 12:31 PM Samantha Gregory, [place] A wrote: Medication: atorvastatin (LIPITOR) 10 MG tablet ,acetaminophen (TYLENOL) 325 MG tablet   Has the patient contacted their pharmacy? Yes (Agent: If no, request that the patient contact the pharmacy for the refill.) (Agent: If yes, when and what did the pharmacy advise?)Contact PCP  Preferred Pharmacy (with phone number or street name): [id number] - [place], [place]  Phone:  [phone] Fax:  [phone]     Agent: Please be advised that RX refills may take up to 3 business days. We ask that you follow-up with your pharmacy.

## 2019-12-30 NOTE — Telephone Encounter (Signed)
Refilled and called left message to call back

## 2019-12-30 NOTE — Telephone Encounter (Signed)
Requested medication (s) are due for refill today: yes  Requested medication (s) are on the active medication list: yes   Future visit scheduled:  no  Notes to clinic:  Last ordered: 3 months ago by Loel Dubonnet, NP Patient also requesting acetaminophen  Review for refill last filled by different provider    Requested Prescriptions  Pending Prescriptions Disp Refills   atorvastatin (LIPITOR) 10 MG tablet 12 tablet 2    Sig: Take one tablet three times per week.      Cardiovascular:  Antilipid - Statins Failed - 12/30/2019 12:45 PM      Failed - Total Cholesterol in normal range and within 360 days    Cholesterol, Total  Date Value Ref Range Status  11/04/2019 219 (H) 100 - 199 mg/dL Final          Failed - LDL in normal range and within 360 days    LDL Chol Calc (NIH)  Date Value Ref Range Status  11/04/2019 121 (H) 0 - 99 mg/dL Final   Direct LDL  Date Value Ref Range Status  02/05/2014 117.0 mg/dL Final    Comment:    Optimal:  <100 mg/dLNear or Above Optimal:  100-129 mg/dLBorderline High:  130-159 mg/dLHigh:  160-189 mg/dLVery High:  >190 mg/dL          Passed - HDL in normal range and within 360 days    HDL  Date Value Ref Range Status  11/04/2019 87 >39 mg/dL Final          Passed - Triglycerides in normal range and within 360 days    Triglycerides  Date Value Ref Range Status  11/04/2019 65 0 - 149 mg/dL Final          Passed - Patient is not pregnant      Passed - Valid encounter within last 12 months    Recent Outpatient Visits           2 months ago Neoplasm of uncertain behavior   [facility] at [facility], [place], DO   3 months ago Left hip pain   [facility], [place], DO   4 months ago Left hip pain   [facility]ivist at [facility], [place], DO   5 months ago Left hip pain   [facility]ers [facility] at [facility], [place], DO   5 months ago Squamous cell carcinoma of Scientist, research (medical) at [facility], [place], [place]

## 2019-12-30 NOTE — Telephone Encounter (Signed)
Patient informed. 

## 2019-12-30 NOTE — Telephone Encounter (Signed)
Copied from [address] [phone]. Topic: General - Other >> Dec 30, 2019 12:28 PM Rainey Pines A wrote: Patient would like a callback from Dr. Nani Ravens  nurse in regards to a nose bleed and the covid vaccine. Please advise   Called the patient left message to call back.

## 2019-12-30 NOTE — Telephone Encounter (Signed)
I don't know.

## 2019-12-30 NOTE — Telephone Encounter (Signed)
Spoke to the patient and she had a nose bleed this morning but since with pressure has stopped. She just wanted to do yard work outside but was afraid to do so

## 2019-12-30 NOTE — Telephone Encounter (Signed)
Copied from [place] [id number]. Topic: General - Other >> Dec 26, 2019 11:59 AM Greggory Keen D wrote: Reason for CRM: pt called asking Dr. Irene Limbo nurse to call her.  She is wanting to know if she [facility]   CB#  [phone]   Advise

## 2019-12-30 NOTE — Addendum Note (Signed)
Addended by: Sharon Seller B on: 12/30/2019 02:38 PM   Modules accepted: Orders

## 2019-12-30 NOTE — Telephone Encounter (Signed)
Informed of PCP instructions. Also the patient had labs completed through cardiology and was prescribed atorvastatin. Would you be willing to take over this refill? She does not want to return to cardiology

## 2019-12-31 MED ORDER — ATORVASTATIN CALCIUM 10 MG PO TABS: ORAL_TABLET | ORAL | 5 refills | Status: DC

## 2020-01-13 NOTE — Progress Notes (Deleted)
Subjective:   Samantha Gregory is a 85 y.o. female who presents for an Initial Medicare Annual Wellness Visit.  Review of Systems      Home Safety/Smoke Alarms: Feels safe in home. Smoke alarms in place.     Female:       Mammo-       Dexa scan-   11/29/18     Objective:    There were no vitals filed for this visit. There is no height or weight on file to calculate BMI.  Advanced Directives 08/17/2017 08/10/2017 08/03/2017 08/01/2017 07/25/2017 09/08/2015  Does Patient Have a Medical Advance Directive? No No No No No Yes  Type of Advance Directive - - - - - Press photographer;Living will  Copy of [place] in Chart? - - - - - No - copy requested    Current Medications (verified) Outpatient Encounter Medications as of 01/14/2020  Medication Sig  . atorvastatin (LIPITOR) 10 MG tablet Take one tablet three times per week.  Marland Kitchen atorvastatin (LIPITOR) 10 MG tablet Take one tablet three times per week.  . triamcinolone cream (KENALOG) 0.1 % APPLY TO AFFECTED AREA TWICE A DAY   No facility-administered encounter medications on file as of 01/14/2020.    Allergies (verified) Statins, Diazolidinyl urea [urea], Gold sodium thiosulfate, and Isothiazolinone chloride   History: Past Medical History:  Diagnosis Date  . Essential hypertension   . Heart palpitations   . Hypercholesterolemia   . Mitral regurgitation   . Orthostatic hypotension   . Paroxysmal atrial tachycardia ([place])   . PVC's (premature ventricular contractions)   . Squamous cell carcinoma, leg, right   . Squamous cell carcinoma, leg, right    Past Surgical History:  Procedure Laterality Date  . APPENDECTOMY    . TEE WITHOUT CARDIOVERSION N/A 09/08/2015   Procedure: TRANSESOPHAGEAL ECHOCARDIOGRAM (TEE);  Surgeon: Lelon Perla, MD;  Location: [facility] ENDOSCOPY;  Service: Cardiovascular;  Laterality: N/A;   Family History  Problem Relation Age of Onset  . Heart attack Father   . Stroke  Mother   . Kidney disease Mother   . Heart disease Brother   . Hypertension Brother   . Heart disease Sister   . Heart disease Sister   . Stroke Paternal Uncle   . Stroke Paternal Aunt    Social History   Socioeconomic History  . Marital status: Widowed    Spouse name: Not on file  . Number of children: Not on file  . Years of education: Not on file  . Highest education level: Not on file  Occupational History  . Not on file  Tobacco Use  . Smoking status: Never Smoker  . Smokeless tobacco: Never Used  Substance and Sexual Activity  . Alcohol use: Yes  . Drug use: No  . Sexual activity: Not on file  Other Topics Concern  . Not on file  Social History Narrative  . Not on file   Social Determinants of Health   Financial Resource Strain:   . Difficulty of Paying Living Expenses: Not on file  Food Insecurity:   . Worried About Charity fundraiser in the Last Year: Not on file  . Ran Out of Food in the Last Year: Not on file  Transportation Needs:   . Lack of Transportation (Medical): Not on file  . Lack of Transportation (Non-Medical): Not on file  Physical Activity:   . Days of Exercise per Week: Not on file  . Minutes  of Exercise per Session: Not on file  Stress:   . Feeling of Stress : Not on file  Social Connections:   . Frequency of Communication with Friends and Family: Not on file  . Frequency of Social Gatherings with Friends and Family: Not on file  . Attends Religious Services: Not on file  . Active Member of Clubs or Organizations: Not on file  . Attends Archivist Meetings: Not on file  . Marital Status: Not on file    Tobacco Counseling Counseling given: Not Answered   Clinical Intake:                        Activities of Daily Living No flowsheet data found.   Immunizations and Health Maintenance Immunization History  Administered Date(s) Administered  . Tdap 11/19/2018   There are no preventive care reminders to  display for this patient.  Patient Care Team: [name], DO as PCP - General (Family Medicine) Revankar, Reita Cliche, MD as PCP - Cardiology (Cardiology)  Indicate any recent Medical Services you may have received from other than [facility] providers in the past year (date may be approximate).     Assessment:   This is a routine wellness examination for Samantha Gregory. Physical assessment deferred to PCP.  Hearing/Vision screen No exam data present  Dietary issues and exercise activities discussed:   Diet (meal preparation, eat out, water intake, caffeinated beverages, dairy products, fruits and vegetables): {Desc; diets:16563} Breakfast: Lunch:  Dinner:      Goals   None    Depression Screen No flowsheet data found.  Fall Risk No flowsheet data found.   Cognitive Function:   Ad8 score reviewed for issues:  Issues making decisions:  Less interest in hobbies / activities:  Repeats questions, stories (family complaining):  Trouble using ordinary gadgets (microwave, computer, phone):  Forgets the month or year:   Mismanaging finances:   Remembering appts:  Daily problems with thinking and/or memory: Ad8 score is=       Screening Tests Health Maintenance  Topic Date Due  . TETANUS/TDAP  11/19/2028  . DEXA SCAN  Completed  . INFLUENZA VACCINE  Discontinued  . PNA vac Low Risk Adult  Discontinued     Plan:   ***  I have personally reviewed and noted the following in the patient's chart:   . Medical and social history . Use of alcohol, tobacco or illicit drugs  . Current medications and supplements . Functional ability and status . Nutritional status . Physical activity . Advanced directives . List of other physicians . Hospitalizations, surgeries, and ER visits in previous 12 months . Vitals . Screenings to include cognitive, depression, and falls . Referrals and appointments  In addition, I have reviewed and discussed with patient certain  preventive protocols, quality metrics, and best practice recommendations. A written personalized care plan for preventive services as well as general preventive health recommendations were provided to patient.     Shela Nevin, [place]   01/13/2020

## 2020-01-14 ENCOUNTER — Other Ambulatory Visit: Payer: Self-pay

## 2020-01-14 ENCOUNTER — Ambulatory Visit: Payer: PPO | Admitting: *Deleted

## 2020-01-15 ENCOUNTER — Telehealth: Payer: Self-pay | Admitting: Family Medicine

## 2020-01-15 NOTE — Telephone Encounter (Signed)
Called her back and she will try again another number through [place]

## 2020-01-15 NOTE — Telephone Encounter (Signed)
Patient informed and she will try to schedule her an appt. Will call her back to see if she got an appt.

## 2020-01-15 NOTE — Telephone Encounter (Signed)
OK to get vaccine. Ty.

## 2020-01-15 NOTE — Telephone Encounter (Signed)
Called the patient back and no answer/no voice mail 

## 2020-01-15 NOTE — Telephone Encounter (Signed)
Pt called in stating she needs Dr. Irene Limbo approval for getting the COVID-19 vaccine. She called [facility] and was advised to add herself to the waitlist but she doesn't have a computer. I have added pt to the waitlist for the vaccine today. She is asking if he approves of her getting the vaccine due to a high number of allergies and asking for return call.   Ph# [phone]

## 2020-02-06 ENCOUNTER — Telehealth: Payer: Self-pay

## 2020-02-06 NOTE — Telephone Encounter (Signed)
Patient called in to see if Dr. Nani Ravens can send in a prescription for [facility]) [place] Please follow up with the Patient at [phone]   Thanks,

## 2020-02-07 IMAGING — DX DG HIP (WITH OR WITHOUT PELVIS) 2-3V LEFT
3 series · 3 of 3 positions shown · non-contrast
Comparison: 05/31/2017

CLINICAL DATA: Left hip and groin pain

EXAM:
DG HIP (WITH OR WITHOUT PELVIS) 2-3V LEFT

[pelvis ap]
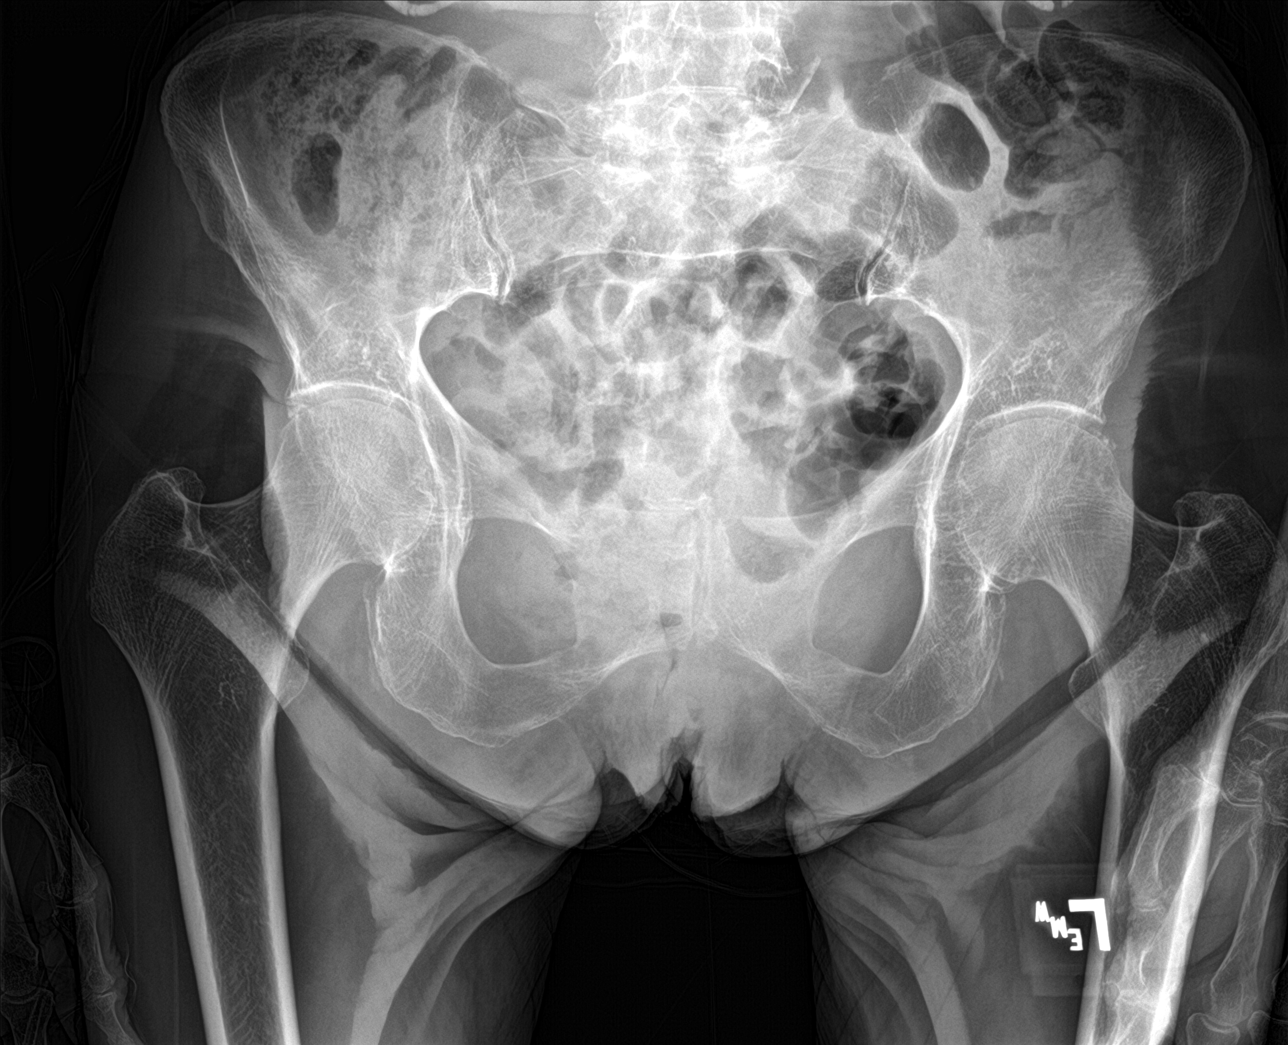

[hip ap]
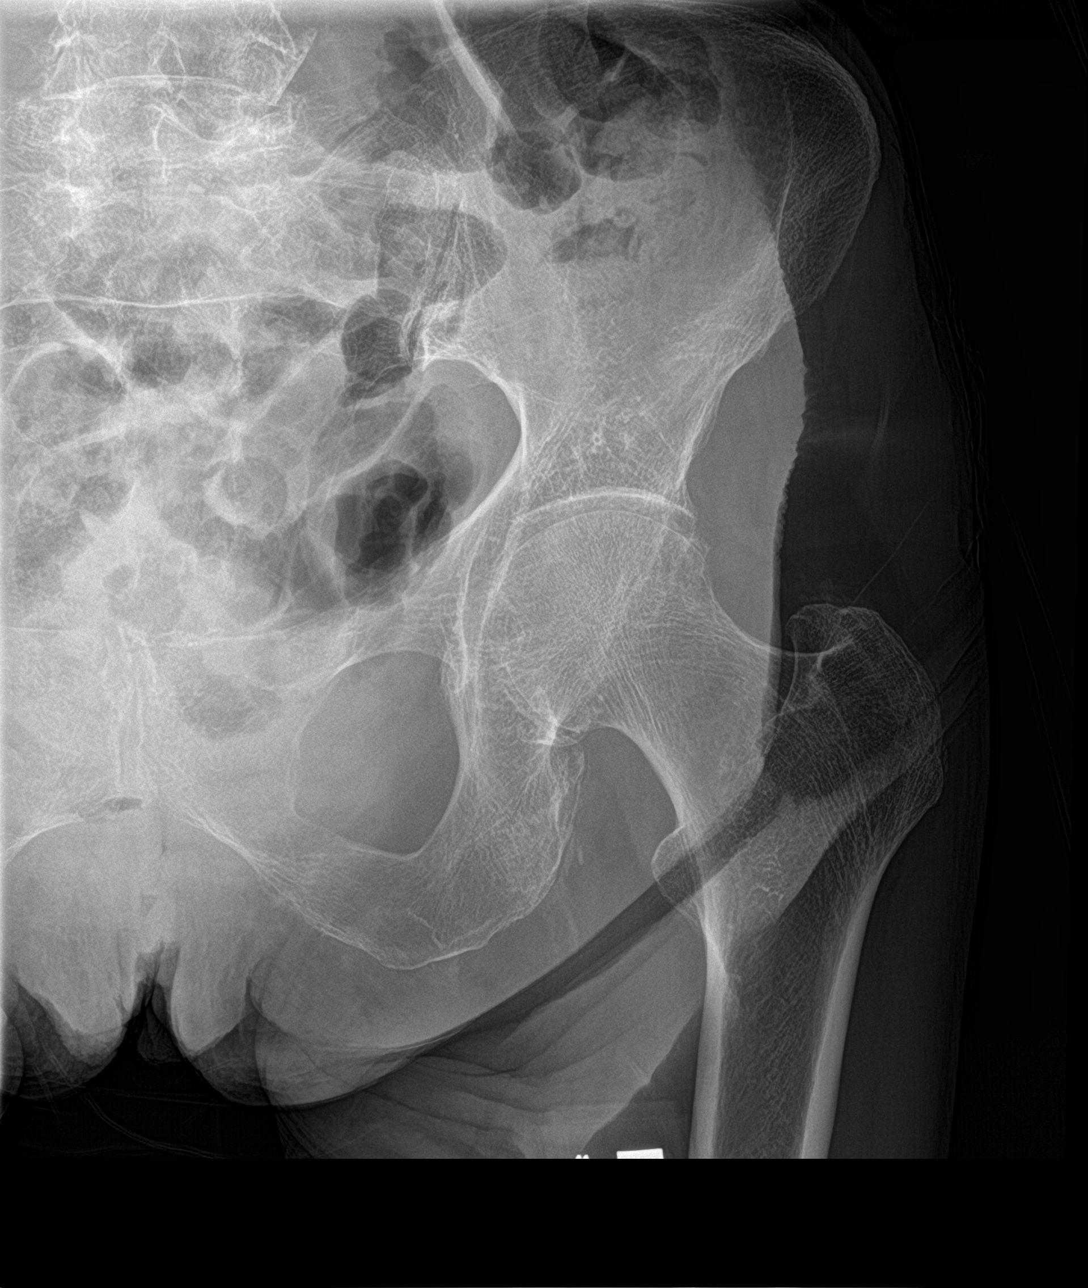

[hip lat]
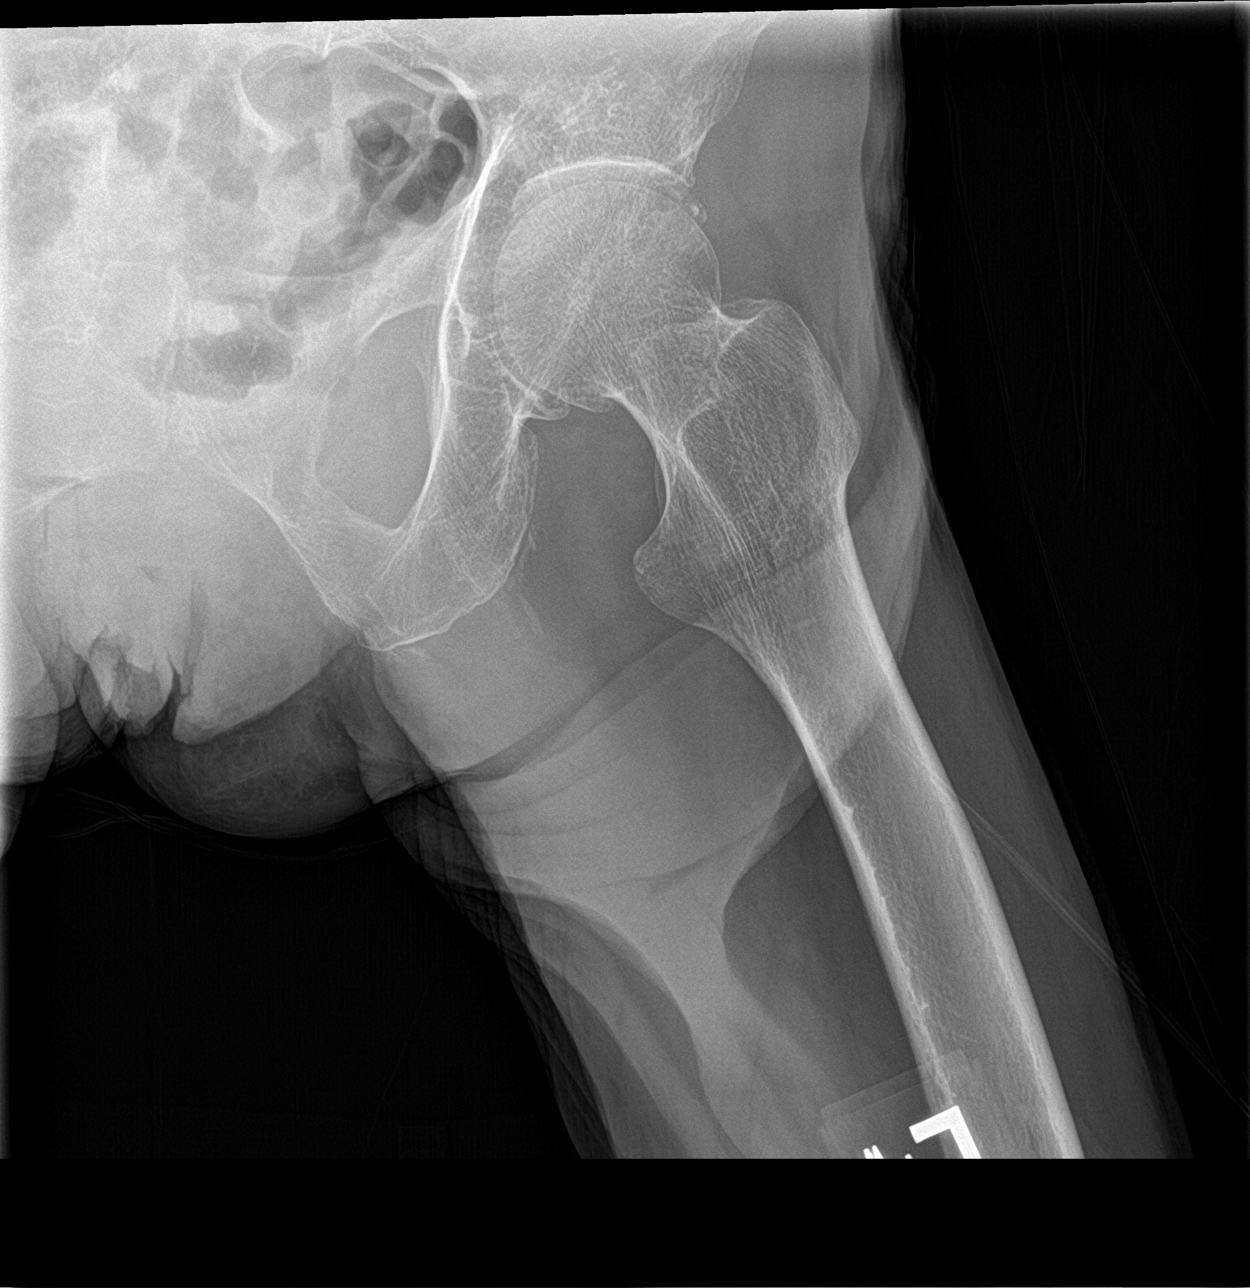

[3 of 3 positions shown; findings below may reference images not displayed]

FINDINGS: There is no evidence of hip fracture or dislocation. Mild bilateral
hip osteoarthritis. There is mild joint space narrowing with
subchondral sclerosis and marginal spur formation.
IMPRESSION: 1. No acute findings.
2. Mild osteoarthritis.

## 2020-02-07 NOTE — Telephone Encounter (Signed)
Lvm for Patient to call about this medication request

## 2020-02-18 ENCOUNTER — Telehealth: Payer: Self-pay

## 2020-02-18 MED ORDER — PREVAGEN 10 MG PO CAPS: 1.0000 | ORAL_CAPSULE | Freq: Every day | ORAL | 1 refills | Status: DC

## 2020-02-18 NOTE — Addendum Note (Signed)
Addended by: Sharon Seller B on: 02/18/2020 03:30 PM   Modules accepted: Orders

## 2020-02-18 NOTE — Telephone Encounter (Signed)
Pt is wanting to try Trevaten.  If you approve without having the pt come in for an appt, [facility]

## 2020-02-18 NOTE — Telephone Encounter (Signed)
I called the patient and clarified she is wanting to start Ransom for memory. It is OTC, but if called in would be less expensive for her. Let me know

## 2020-02-18 NOTE — Telephone Encounter (Signed)
Sent in to her pharmacy Called to inform the patient sent to her pharmacy

## 2020-02-18 NOTE — Telephone Encounter (Signed)
OK 

## 2020-03-17 ENCOUNTER — Telehealth: Payer: Self-pay | Admitting: Family Medicine

## 2020-03-17 NOTE — Telephone Encounter (Signed)
Caller : Samantha Gregory Call Back # [phone]   Subject: Prolia injection  Patient is wondering whether or not she can take the medication above. Please advise   Patient states that she ran into some problems with her teeth and that she may lose them b/c of bone condition.

## 2020-03-17 NOTE — Telephone Encounter (Signed)
Let's see if they will cover it. She did not tolerate Fosamax, right?

## 2020-03-17 NOTE — Telephone Encounter (Signed)
Last Bone Density 01/30/2018

## 2020-03-17 NOTE — Telephone Encounter (Signed)
Called and this patient has taken fosamax and other medications as well years ago Yes she did have problems with the fosamax. She would like to look into Prolia. Please look into the authorization for this patient

## 2020-03-18 NOTE — Telephone Encounter (Signed)
FYI sent patients information in. Waiting on summary of benefits.

## 2020-03-23 ENCOUNTER — Telehealth: Payer: Self-pay

## 2020-03-25 ENCOUNTER — Telehealth: Payer: Self-pay | Admitting: Family Medicine

## 2020-03-25 DIAGNOSIS — R002 Palpitations: Secondary | ICD-10-CM

## 2020-03-25 MED ORDER — METOPROLOL TARTRATE 25 MG PO TABS: 25.0000 mg | ORAL_TABLET | Freq: Two times a day (BID) | ORAL | 5 refills | Status: DC | PRN

## 2020-03-25 NOTE — Telephone Encounter (Signed)
Sent in to the pharmacy and patient informed.

## 2020-03-25 NOTE — Addendum Note (Signed)
Addended by: Sharon Seller B on: 03/25/2020 02:43 PM   Modules accepted: Orders

## 2020-03-25 NOTE — Telephone Encounter (Signed)
The patient has been prescribed metoprolol tartrate  25 mg years ago from her Cardiologist. She is in need of a refill/has upcoming appt with cardiologist on 04/01/2020 but has only 2 pills left and would like a refill if possible for today. Last time filled was 2018 with Dr. Irish Lack and works well to stop her rapid HR. She does not take everyday

## 2020-03-25 NOTE — Telephone Encounter (Signed)
The patient informed of medication sent in for her.

## 2020-03-25 NOTE — Telephone Encounter (Signed)
That's fine

## 2020-04-01 ENCOUNTER — Ambulatory Visit: Payer: PPO | Admitting: Cardiology

## 2020-04-03 ENCOUNTER — Other Ambulatory Visit: Payer: Self-pay

## 2020-04-03 ENCOUNTER — Encounter: Payer: Self-pay | Admitting: Cardiology

## 2020-04-03 ENCOUNTER — Ambulatory Visit (INDEPENDENT_AMBULATORY_CARE_PROVIDER_SITE_OTHER): Payer: PPO | Admitting: Cardiology

## 2020-04-03 VITALS — BP 148/80 | HR 74 | Ht 65.0 in | Wt 88.0 lb

## 2020-04-03 DIAGNOSIS — I351 Nonrheumatic aortic (valve) insufficiency: Secondary | ICD-10-CM

## 2020-04-03 DIAGNOSIS — I340 Nonrheumatic mitral (valve) insufficiency: Secondary | ICD-10-CM | POA: Diagnosis not present

## 2020-04-03 NOTE — Patient Instructions (Signed)

## 2020-04-03 NOTE — Progress Notes (Signed)
Cardiology Office Note:    Date:  04/03/2020   ID:  Samantha Gregory, [place] 09-04-1935, MRN [id number]  PCP:  [name], DO  Cardiologist:  Jenean Lindau, MD   Referring MD: [name]*    ASSESSMENT:    1. Mitral valve insufficiency, unspecified etiology   2. Moderate aortic insufficiency    PLAN:    In order of problems listed above:  1. Primary prevention stressed with the patient.  Importance of compliance with diet and medication stressed and she vocalized understanding. 2. Aortic regurgitation: Echocardiogram report was discussed with her and answer stable and she will continue current 3. Mitral regurgitation: Stable.  She has multiple blood pressure readings from home and they are stable to it in the range of [id number] systolic 4. I reviewed her blood work and discussed it with her and questions were answered to her satisfaction. 5. She has hurt her right ankle and she tells me that when she does a lot of her right ankle is sore.  I evaluated it could be best for her to evaluated by her primary care physician for evaluation of possible hairline fracture or such.  She will set up an appointment to see her primary care physician.  It does not look very acute in its presentation. 6. Patient will be seen in follow-up appointment in 6 months or earlier if the patient has any concerns 7.    Medication Adjustments/Labs and Tests Ordered: Current medicines are reviewed at length with the patient today.  Concerns regarding medicines are outlined above.  No orders of the defined types were placed in this encounter.  No orders of the defined types were placed in this encounter.    Chief Complaint  Patient presents with  . Follow-up     History of Present Illness:    Samantha Gregory is a 85 y.o. female.  Patient has past medical history of mitral and takes.  She denies numbness at this time and takes care of activities of daily living.  No  chest pain orthopnea or PND.  She is a very active lady and appears younger than her age.  She denies any complaints at this time.  She is very active.  Past Medical History:  Diagnosis Date  . Essential hypertension   . Heart palpitations   . Hypercholesterolemia   . Mitral regurgitation   . Orthostatic hypotension   . Paroxysmal atrial tachycardia ([place])   . PVC's (premature ventricular contractions)   . Squamous cell carcinoma, leg, right   . Squamous cell carcinoma, leg, right     Past Surgical History:  Procedure Laterality Date  . APPENDECTOMY    . TEE WITHOUT CARDIOVERSION N/A 09/08/2015   Procedure: TRANSESOPHAGEAL ECHOCARDIOGRAM (TEE);  Surgeon: Lelon Perla, MD;  Location: [facility] ENDOSCOPY;  Service: Cardiovascular;  Laterality: N/A;    Current Medications: Current Meds  Medication Sig  . Apoaequorin (PREVAGEN) 10 MG CAPS Take 1 capsule by mouth daily.  . metoprolol tartrate (LOPRESSOR) 25 MG tablet Take 1 tablet (25 mg total) by mouth 2 (two) times daily as needed (Use as needed for rapid heart beat.).  . [DISCONTINUED] atorvastatin (LIPITOR) 10 MG tablet Take one tablet three times per week.     Allergies:   Statins, Diazolidinyl urea [urea], Gold sodium thiosulfate, and Isothiazolinone chloride   Social History   Socioeconomic History  . Marital status: Widowed    Spouse name: Not on file  . Number of children: Not on  file  . Years of education: Not on file  . Highest education level: Not on file  Occupational History  . Not on file  Tobacco Use  . Smoking status: Never Smoker  . Smokeless tobacco: Never Used  Substance and Sexual Activity  . Alcohol use: Yes  . Drug use: No  . Sexual activity: Not on file  Other Topics Concern  . Not on file  Social History Narrative  . Not on file   Social Determinants of Health   Financial Resource Strain:   . Difficulty of Paying Living Expenses:   Food Insecurity:   . Worried About Charity fundraiser in the  Last Year:   . Arboriculturist in the Last Year:   Transportation Needs:   . Film/video editor (Medical):   Marland Kitchen Lack of Transportation (Non-Medical):   Physical Activity:   . Days of Exercise per Week:   . Minutes of Exercise per Session:   Stress:   . Feeling of Stress :   Social Connections:   . Frequency of Communication with Friends and Family:   . Frequency of Social Gatherings with Friends and Family:   . Attends Religious Services:   . Active Member of Clubs or Organizations:   . Attends Archivist Meetings:   Marland Kitchen Marital Status:      Family History: The patient's family history includes Heart attack in her father; Heart disease in her brother, sister, and sister; Hypertension in her brother; Kidney disease in her mother; Stroke in her mother, paternal aunt, and paternal uncle.  ROS:   Please see the history of present illness.    All other systems reviewed and are negative.  EKGs/Labs/Other Studies Reviewed:    The following studies were reviewed today: IMPRESSIONS    1. The left ventricle has normal systolic function, with an ejection  fraction of 55-60%. The cavity size was normal. There is mild concentric  left ventricular hypertrophy. Left ventricular diastolic Doppler  parameters are indeterminate. No evidence of  left ventricular regional wall motion abnormalities.  2. The right ventricle has normal systolic function. The cavity was  normal. There is no increase in right ventricular wall thickness.  3. The mitral valve is degenerative. Mild thickening of the mitral valve  leaflet. Mitral valve regurgitation is mild to moderate by color flow  Doppler. No evidence of mitral valve stenosis.  4. The aortic valve is tricuspid. Mild thickening of the aortic valve.  Aortic valve regurgitation is mild to moderate by color flow Doppler. No  stenosis of the aortic valve.  5. The aorta is normal in size and structure.  6. The aortic root and ascending  aorta are normal in size and structure.    Recent Labs: 06/25/2019: TSH 3.58 07/05/2019: Hemoglobin 12.1; Platelets 233.0 11/04/2019: ALT 24; BUN 20; Creatinine, Ser 0.70; Potassium 3.9; Sodium 141  Recent Lipid Panel    Component Value Date/Time   CHOL 219 (H) 11/04/2019 0813   TRIG 65 11/04/2019 0813   HDL 87 11/04/2019 0813   CHOLHDL 2.5 11/04/2019 0813   CHOLHDL 2.3 12/30/2015 0741   VLDL 17 12/30/2015 0741   LDLCALC 121 (H) 11/04/2019 0813   LDLDIRECT 117.0 02/05/2014 0812    Physical Exam:    VS:  BP (!) 148/80   Pulse 74   Ht 5\' 5"  (1.651 m)   Wt 88 lb (39.9 kg)   SpO2 100%   BMI 14.64 kg/m     Wt Readings  from Last 3 Encounters:  04/03/20 88 lb (39.9 kg)  10/07/19 87 lb 6 oz (39.6 kg)  09/30/19 86 lb 4 oz (39.1 kg)     GEN: Patient is in no acute distress HEENT: Normal NECK: No JVD; No carotid bruits LYMPHATICS: No lymphadenopathy CARDIAC: Hear sounds regular, 2/6 systolic murmur at the apex. RESPIRATORY:  Clear to auscultation without rales, wheezing or rhonchi  ABDOMEN: Soft, non-tender, non-distended MUSCULOSKELETAL:  No edema; No deformity  SKIN: Warm and dry NEUROLOGIC:  Alert and oriented x 3 PSYCHIATRIC:  Normal affect   Signed, Jenean Lindau, MD  04/03/2020 1:31 PM    [facility]

## 2020-04-06 ENCOUNTER — Ambulatory Visit (HOSPITAL_BASED_OUTPATIENT_CLINIC_OR_DEPARTMENT_OTHER)
Admission: RE | Admit: 2020-04-06 | Discharge: 2020-04-06 | Disposition: A | Discharge status: Home / Self Care | Payer: PPO | Source: Ambulatory Visit | Attending: Family Medicine | Admitting: Family Medicine

## 2020-04-06 ENCOUNTER — Ambulatory Visit (INDEPENDENT_AMBULATORY_CARE_PROVIDER_SITE_OTHER): Payer: PPO | Admitting: Family Medicine

## 2020-04-06 ENCOUNTER — Encounter: Payer: Self-pay | Admitting: Family Medicine

## 2020-04-06 ENCOUNTER — Other Ambulatory Visit: Payer: Self-pay

## 2020-04-06 VITALS — BP 142/82 | HR 60 | Temp 98.2°F | Resp 12 | Ht 64.0 in | Wt 88.4 lb

## 2020-04-06 DIAGNOSIS — M25571 Pain in right ankle and joints of right foot: Secondary | ICD-10-CM | POA: Insufficient documentation

## 2020-04-06 DIAGNOSIS — M810 Age-related osteoporosis without current pathological fracture: Secondary | ICD-10-CM

## 2020-04-06 DIAGNOSIS — E440 Moderate protein-calorie malnutrition: Secondary | ICD-10-CM | POA: Insufficient documentation

## 2020-04-06 DIAGNOSIS — M898X6 Other specified disorders of bone, lower leg: Secondary | ICD-10-CM

## 2020-04-06 DIAGNOSIS — H6121 Impacted cerumen, right ear: Secondary | ICD-10-CM | POA: Diagnosis not present

## 2020-04-06 HISTORY — DX: Pain in right ankle and joints of right foot: M25.571

## 2020-04-06 HISTORY — DX: Moderate protein-calorie malnutrition: E44.0

## 2020-04-06 LAB — VITAMIN D 25 HYDROXY (VIT D DEFICIENCY, FRACTURES): VITD: 34.55 ng/mL (ref 30.00–100.00)

## 2020-04-06 MED ORDER — DENOSUMAB 60 MG/ML ~~LOC~~ SOSY
60.0000 mg | PREFILLED_SYRINGE | Freq: Once | SUBCUTANEOUS | Status: AC
  Administered 2020-04-06: 60 mg via SUBCUTANEOUS

## 2020-04-06 NOTE — Patient Instructions (Signed)
Give [place] 2-3 business days to get the results of your labs back.   We will be in touch regarding your X-ray results.   Heat (pad or rice pillow in microwave) over affected area, 10-15 minutes twice daily.   Ice/cold pack over area for 10-15 min twice daily.  Ankle Exercises It is normal to feel mild stretching, pulling, tightness, or discomfort as you do these exercises, but you should stop right away if you feel sudden pain or your pain gets worse.  Stretching and range of motion exercises These exercises warm up your muscles and joints and improve the movement and flexibility of your ankle. These exercises also help to relieve pain, numbness, and tingling. Exercise A: Dorsiflexion/Plantar Flexion   1. Sit with your affected knee straight or bent. Do not rest your foot on anything. 2. Flex your affected ankle to tilt the top of your foot toward your shin. 3. Hold this position for 5 seconds. 4. Point your toes downward to tilt the top of your foot away from your shin. 5. Hold this position for 5 seconds. Repeat 2 times. Complete this exercise 3 times per week. Exercise B: Ankle Alphabet   1. Sit with your affected foot supported at your lower leg. ? Do not rest your foot on anything. ? Make sure your foot has room to move freely. 2. Think of your affected foot as a paintbrush, and move your foot to trace each letter of the alphabet in the air. Keep your hip and knee still while you trace. Make the letters as large as you can without increasing any discomfort. 3. Trace every letter from A to Z. Repeat 2 times. Complete this exercise 3 times per week. Strengthening exercises These exercises build strength and endurance in your ankle. Endurance is the ability to use your muscles for a long time, even after they get tired. Exercise D: Dorsiflexors   1. Secure a rubber exercise band or tube to an object, such as a table leg, that will stay still when the band is pulled. Secure the  other end around your affected foot. 2. Sit on the floor, facing the object with your affected leg extended. The band or tube should be slightly tense when your foot is relaxed. 3. Slowly flex your affected ankle and toes to bring your foot toward you. 4. Hold this position for 3 seconds.  5. Slowly return your foot to the starting position, controlling the band as you do that. Do a total of 10 repetitions. Repeat 2 times. Complete this exercise 3 times per week. Exercise E: Plantar Flexors   1. Sit on the floor with your affected leg extended. 2. Loop a rubber exercise band or tube around the ball of your affected foot. The ball of your foot is on the walking surface, right under your toes. The band or tube should be slightly tense when your foot is relaxed. 3. Slowly point your toes downward, pushing them away from you. 4. Hold this position for 3 seconds. 5. Slowly release the tension in the band or tube, controlling smoothly until your foot is back in the starting position. Repeat for a total of 10 repetitions. Repeat 2 times. Complete this exercise 3 times per week. Exercise F: Towel Curls   1. Sit in a chair on a non-carpeted surface, and put your feet on the floor. 2. Place a towel in front of your feet.  3. Keeping your heel on the floor, put your affected foot on the  towel. 4. Pull the towel toward you by grabbing the towel with your toes and curling them under. Keep your heel on the floor. 5. Let your toes relax. 6. Grab the towel again. Keep going until the towel is completely underneath your foot. Repeat for a total of 10 repetitions. Repeat 2 times. Complete this exercise 3 times per week. Exercise G: Heel Raise ( Plantar Flexors, Standing)    1. Stand with your feet shoulder-width apart. 2. Keep your weight spread evenly over the width of your feet while you rise up on your toes. Use a wall or table to steady yourself, but try not to use it for support. 3. If this  exercise is too easy, try these options: ? Shift your weight toward your affected leg until you feel challenged. ? If told by your health care provider, lift your uninjured leg off the floor. 4. Hold this position for 3 seconds. Repeat for a total of 10 repetitions. Repeat 2 times. Complete this exercise 3 times per week. Exercise H: Tandem Walking 1. Stand with one foot directly in front of the other. 2. Slowly raise your back foot up, lifting your heel before your toes, and place it directly in front of your other foot. 3. Continue to walk in this heel-to-toe way for 10 steps or for as long as told by your health care provider. Have a countertop or wall nearby to use if needed to keep your balance, but try not to hold onto anything for support. Repeat 2 times. Complete this exercises 3 times per week. Make sure you discuss any questions you have with your health care provider. Document Released: 10/26/2005 Document Revised: 08/11/2016 Document Reviewed: 08/30/2015 Elsevier Interactive Patient Education  2018 [employer].

## 2020-04-06 NOTE — Progress Notes (Addendum)
Musculoskeletal Exam  Patient: Samantha Gregory DOB: 01/26/1935  DOS: 04/06/2020  SUBJECTIVE:  Chief Complaint:   Chief Complaint  Patient presents with  . Ankle Pain    right    Rhilynn Nevaya Alessandra is a 85 y.o.  female for evaluation and treatment of R ankle pain.   Onset:  1 month ago. No known inj or change in activity.  Location: R outer ankle Character:  aching and dull  Progression of issue:  is unchanged Associated symptoms: some swelling if she bears weight; no bruising, redness Treatment: to date has been rest.   Neurovascular symptoms: no    Past Medical History:  Diagnosis Date  . Essential hypertension   . Heart palpitations   . Hypercholesterolemia   . Mitral regurgitation   . Orthostatic hypotension   . Paroxysmal atrial tachycardia ([place])   . PVC's (premature ventricular contractions)   . Squamous cell carcinoma, leg, right   . Squamous cell carcinoma, leg, right     Objective: VITAL SIGNS: BP (!) 142/82 (BP Location: Left Arm, Cuff Size: Normal)   Pulse 60   Temp 98.2 F (36.8 C) (Temporal)   Resp 12   Ht 5\' 4"  (1.626 m)   Wt 88 lb 6.4 oz (40.1 kg)   SpO2 100%   BMI 15.17 kg/m  Constitutional: Well formed, well developed. No acute distress. Ears: L ear neg, R ear w cerumen, TM neg after removal Thorax & Lungs: No accessory muscle use Musculoskeletal: R ankle.   Normal active range of motion: yes.   Normal passive range of motion: yes Tenderness to palpation: Yes; lateral ankle Deformity: no Ecchymosis: no Tests positive: None Tests negative: Squeeze, ant drawer Neurologic: Normal sensory function. No focal deficits noted.  Psychiatric: Normal mood. Age appropriate judgment and insight. Alert & oriented x 3.    Assessment:  Acute right ankle pain - Plan: DG Ankle Complete Right  Tibial pain - Plan: VITAMIN D 25 Hydroxy (Vit-D Deficiency, Fractures)  Impacted cerumen of right ear  Moderate protein-calorie malnutrition  (HCC)  Plan: 1- Stretches/exercises. Ck XR. Ice, heat. 2- Ck Vit D. Cont supp pending results.  3- Flushed today. OTC options discussed with self flushes.  F/u pending above. The patient voiced understanding and agreement to the plan.   [place], DO 04/06/20  9:20 AM

## 2020-04-06 NOTE — Addendum Note (Signed)
Addended by: Kem Boroughs D on: 04/06/2020 10:25 AM   Modules accepted: Orders

## 2020-05-22 ENCOUNTER — Encounter: Payer: Self-pay | Admitting: Family Medicine

## 2020-05-22 ENCOUNTER — Ambulatory Visit (INDEPENDENT_AMBULATORY_CARE_PROVIDER_SITE_OTHER): Payer: PPO | Admitting: Family Medicine

## 2020-05-22 ENCOUNTER — Other Ambulatory Visit: Payer: Self-pay

## 2020-05-22 VITALS — BP 130/80 | HR 67 | Temp 96.0°F | Ht 65.0 in | Wt 85.5 lb

## 2020-05-22 DIAGNOSIS — T50905A Adverse effect of unspecified drugs, medicaments and biological substances, initial encounter: Secondary | ICD-10-CM

## 2020-05-22 DIAGNOSIS — M810 Age-related osteoporosis without current pathological fracture: Secondary | ICD-10-CM

## 2020-05-22 DIAGNOSIS — R64 Cachexia: Secondary | ICD-10-CM

## 2020-05-22 DIAGNOSIS — K009 Disorder of tooth development, unspecified: Secondary | ICD-10-CM

## 2020-05-22 DIAGNOSIS — D489 Neoplasm of uncertain behavior, unspecified: Secondary | ICD-10-CM | POA: Diagnosis not present

## 2020-05-22 HISTORY — DX: Cachexia: R64

## 2020-05-22 MED ORDER — TRIAMCINOLONE ACETONIDE 0.1 % EX CREA: 1.0000 "application " | TOPICAL_CREAM | Freq: Two times a day (BID) | CUTANEOUS | 0 refills | Status: DC

## 2020-05-22 MED ORDER — LEVOCETIRIZINE DIHYDROCHLORIDE 5 MG PO TABS: 5.0000 mg | ORAL_TABLET | Freq: Every evening | ORAL | 2 refills | Status: DC

## 2020-05-22 NOTE — Patient Instructions (Addendum)
Call your skin doctor for an appointment in the next 2-3 weeks.  If you need a referral, please let me know.  Try not to itch.  Cold can be helpful in controlling the itching.   Try not to use scented products.   Let us know if you need anything.

## 2020-05-22 NOTE — Progress Notes (Signed)
Chief Complaint  Patient presents with  . Rash    Samantha Gregory is a 85 y.o. female here for a skin complaint.  Duration: 1 month Location: Face, scalp, upper body Pruritic? Yes Painful? No Drainage? No New soaps/lotions/topicals/detergents? No Sick contacts? No Other associated symptoms: scabs and comes back Therapies tried thus far: topical anti-itch creams  03/14/91 and stopped 5 years later for osteoporosis. Currently on Prolia injections, first was around 1 mo ago and rash followed. She is taking Ca and Vit D.   She has a hx of dental decay. Saw a dentist who took XR's and sent her to another provider who wanted to pull the bottom row of her teeth and place a bridge. She did do this due to weather. She is requesting a dentist. She has not been able to eat normally, but does not want to see a nutritionist in the meanwhile.   Past Medical History:  Diagnosis Date  . Essential hypertension   . Heart palpitations   . Hypercholesterolemia   . Mitral regurgitation   . Orthostatic hypotension   . Paroxysmal atrial tachycardia ([place])   . PVC's (premature ventricular contractions)   . Squamous cell carcinoma, leg, right   . Squamous cell carcinoma, leg, right     BP 130/80 (BP Location: Left Arm, Patient Position: Sitting, Cuff Size: Normal)   Pulse 67   Temp (!) 96 F (35.6 C) (Temporal)   Ht 5\' 5"  (1.651 m)   Wt 85 lb 8 oz (38.8 kg)   SpO2 98%   BMI 14.23 kg/m  Gen: awake, alert, appearing stated age Lungs: No accessory muscle use Skin: various areas of excoriated patches with slight hyperpigmentation, scaling on legs, forearms. On the R dorsal forearm, there is a raised and excoriated lesion measuring approx 0.8 cm in diameter. No fluctuance or ttp. There is a pinkish and slightly raised lesion on the skin just above her R upper lip. It measures approx 0.3 cm in diameter. No fluctuance or ttp.  Psych: Age appropriate judgment and insight  Adverse effect of drug,  initial encounter - Plan: levocetirizine (XYZAL) 5 MG tablet, triamcinolone cream (KENALOG) 0.1 %  Osteoporosis, unspecified osteoporosis type, unspecified pathological fracture presence  Neoplasm of uncertain behavior  Cachexia (HCC)  Dental anomaly  1- I think this is related to the Prolia. I will try some topical steroid prn and Xyzal to mitigate histamine. She is to follow up with drem in 2-3 weeks as she did bring up [place], which I do not rx.  2- May have to go back to Fosamax pending ability to control AE's.  3- I think she has SCC on her forearm. I think she has another lesion on concern on her lip. I would rather she follow up with her dermatologist on these issues.  4- Offered referral to nutritionist given her dental issues but she declined. 5- We will complete a general dentistry referral for her, but noted that I am not aware specifically of any general dentists who can manage her specific issues.  I will see her as needed/pending AE management for the above at this point. The patient voiced understanding and agreement to the plan.  Greater than 45 minutes were spent face to face with the patient with greater than 50% of this time spent counseling on skin follow up, derm f/u, dental resources, and osteoporosis management. [name], DO 05/22/20 9:59 AM

## 2020-06-08 DIAGNOSIS — C44622 Squamous cell carcinoma of skin of right upper limb, including shoulder: Secondary | ICD-10-CM | POA: Diagnosis not present

## 2020-06-08 DIAGNOSIS — D485 Neoplasm of uncertain behavior of skin: Secondary | ICD-10-CM | POA: Diagnosis not present

## 2020-06-08 DIAGNOSIS — L821 Other seborrheic keratosis: Secondary | ICD-10-CM | POA: Diagnosis not present

## 2020-06-08 DIAGNOSIS — D692 Other nonthrombocytopenic purpura: Secondary | ICD-10-CM | POA: Diagnosis not present

## 2020-06-08 DIAGNOSIS — L570 Actinic keratosis: Secondary | ICD-10-CM | POA: Diagnosis not present

## 2020-06-18 ENCOUNTER — Telehealth: Payer: Self-pay | Admitting: Family Medicine

## 2020-06-18 NOTE — Telephone Encounter (Signed)
Sched appt to discuss plz. Ty.

## 2020-06-18 NOTE — Telephone Encounter (Signed)
Caller : Eulanda  Call Back # [phone]  [facility]) 10 MG CAPS [[id number]  Patient states she would like to change prescription dosage.   Please Advise

## 2020-06-18 NOTE — Telephone Encounter (Signed)
Please advise 

## 2020-06-24 ENCOUNTER — Ambulatory Visit: Payer: PPO | Admitting: Family Medicine

## 2020-06-24 DIAGNOSIS — L820 Inflamed seborrheic keratosis: Secondary | ICD-10-CM | POA: Diagnosis not present

## 2020-06-24 DIAGNOSIS — L821 Other seborrheic keratosis: Secondary | ICD-10-CM | POA: Diagnosis not present

## 2020-06-24 DIAGNOSIS — C44622 Squamous cell carcinoma of skin of right upper limb, including shoulder: Secondary | ICD-10-CM | POA: Diagnosis not present

## 2020-06-26 ENCOUNTER — Other Ambulatory Visit: Payer: Self-pay | Admitting: Family Medicine

## 2020-06-26 DIAGNOSIS — T50905A Adverse effect of unspecified drugs, medicaments and biological substances, initial encounter: Secondary | ICD-10-CM

## 2020-09-02 ENCOUNTER — Ambulatory Visit: Payer: PPO | Admitting: Family Medicine

## 2020-09-04 ENCOUNTER — Ambulatory Visit (INDEPENDENT_AMBULATORY_CARE_PROVIDER_SITE_OTHER): Payer: PPO | Admitting: Family Medicine

## 2020-09-04 ENCOUNTER — Encounter: Payer: Self-pay | Admitting: Family Medicine

## 2020-09-04 ENCOUNTER — Other Ambulatory Visit: Payer: Self-pay

## 2020-09-04 VITALS — BP 120/72 | HR 59 | Temp 97.9°F | Ht 64.0 in | Wt 86.4 lb

## 2020-09-04 DIAGNOSIS — L603 Nail dystrophy: Secondary | ICD-10-CM

## 2020-09-04 DIAGNOSIS — L309 Dermatitis, unspecified: Secondary | ICD-10-CM | POA: Diagnosis not present

## 2020-09-04 MED ORDER — FEXOFENADINE HCL 180 MG PO TABS: 180.0000 mg | ORAL_TABLET | Freq: Every day | ORAL | 3 refills | Status: DC

## 2020-09-04 NOTE — Patient Instructions (Addendum)
I do not recommend Prevagen.   Use fragrance free lotion twice daily.   Try not to itch.  Cold/ice can be helpful.   Take Allegra (fexofenadine) 180 mg daily. This may help with the itch. I sent some in, but it is also over the counter.   Let us know if you need anything.

## 2020-09-04 NOTE — Progress Notes (Signed)
Chief Complaint  Patient presents with  . Follow-up    problem with lip and discuss Prolia  . Nail Problem  . Allergies    Subjective: Patient is a 85 y.o. female here for f/u.  Having itching of the skin. She has it worse on the legs. Has been using fragrance free lotion nightly. She was given Kenalog that has not been helpful.  She was prescribed Xyzal several months ago but was unable to take it because she did not like the way it made her feel.  She has not tried anything else since then.  The lesion above her lip is unchanged.  Her dermatologist saw and stated that nothing else needs to be done.  She is worried because it has not gone away or changed.  No pain, drainage, or itching.  The patient has noticed that her nails have started to split.  It is longitudinal.  There is no pain and she denies any trauma.  Past Medical History:  Diagnosis Date  . Essential hypertension   . Heart palpitations   . Hypercholesterolemia   . Mitral regurgitation   . Orthostatic hypotension   . Paroxysmal atrial tachycardia ([place])   . PVC's (premature ventricular contractions)   . Squamous cell carcinoma, leg, right   . Squamous cell carcinoma, leg, right     Objective: BP 120/72 (BP Location: Left Arm, Patient Position: Sitting, Cuff Size: Normal)   Pulse (!) 59   Temp 97.9 F (36.6 C) (Oral)   Ht 5\' 4"  (1.626 m)   Wt 86 lb 6 oz (39.2 kg)   SpO2 98%   BMI 14.83 kg/m  General: Awake, appears stated age Skin: Varicose veins noted in the lower extremities bilaterally; there is no tenderness to palpation ulcerations over these.;  Scattered areas of scaling over the lower extremities bilaterally.  There is a small red raised lesion above the left upper lip measuring 3 mm in diameter.  No tenderness, drainage, or scaling.  The second and third digit and first and second digit of her right hand and right foot respectively have a split centrally in a longitudinal fashion.  There is no tenderness  or subungual hematoma. Lungs: No accessory muscle use Psych: Age appropriate judgment and insight, normal affect and mood  Assessment and Plan: Dermatitis  Splitting of nail - Plan: TSH, Iron, Iron Binding Cap (TIBC)([facility]), Ferritin  I want her to use her fragrance free lotion twice daily.  Try not to itch.  Cold packs/ice can be helpful.  Start taking Allegra/fexofenadine daily.  If no improvement, I want her to return. Check labs.  May be idiopathic. Reassurance for her lip lesion. I want to see her in 1 month if no improvement. The patient voiced understanding and agreement to the plan.  [place], DO 09/04/20  3:20 PM

## 2020-09-05 LAB — IRON, TOTAL/TOTAL IRON BINDING CAP
%SAT: 13 % (calc) — ABNORMAL LOW (ref 16–45)
Iron: 45 ug/dL (ref 45–160)
TIBC: 337 mcg/dL (calc) (ref 250–450)

## 2020-09-05 LAB — FERRITIN: Ferritin: 28 ng/mL (ref 16–288)

## 2020-09-05 LAB — TSH: TSH: 3.91 mIU/L (ref 0.40–4.50)

## 2020-10-02 DIAGNOSIS — I4719 Other supraventricular tachycardia: Secondary | ICD-10-CM | POA: Insufficient documentation

## 2020-10-02 DIAGNOSIS — I471 Supraventricular tachycardia: Secondary | ICD-10-CM | POA: Insufficient documentation

## 2020-10-02 DIAGNOSIS — R002 Palpitations: Secondary | ICD-10-CM | POA: Insufficient documentation

## 2020-10-02 DIAGNOSIS — I340 Nonrheumatic mitral (valve) insufficiency: Secondary | ICD-10-CM | POA: Insufficient documentation

## 2020-10-05 ENCOUNTER — Encounter: Payer: Self-pay | Admitting: Family Medicine

## 2020-10-05 ENCOUNTER — Ambulatory Visit (INDEPENDENT_AMBULATORY_CARE_PROVIDER_SITE_OTHER): Payer: PPO | Admitting: Family Medicine

## 2020-10-05 ENCOUNTER — Other Ambulatory Visit: Payer: Self-pay

## 2020-10-05 VITALS — BP 120/78 | HR 50 | Temp 98.0°F | Ht 65.0 in | Wt 86.0 lb

## 2020-10-05 DIAGNOSIS — M533 Sacrococcygeal disorders, not elsewhere classified: Secondary | ICD-10-CM

## 2020-10-05 DIAGNOSIS — R21 Rash and other nonspecific skin eruption: Secondary | ICD-10-CM | POA: Diagnosis not present

## 2020-10-05 DIAGNOSIS — K921 Melena: Secondary | ICD-10-CM

## 2020-10-05 DIAGNOSIS — R413 Other amnesia: Secondary | ICD-10-CM

## 2020-10-05 DIAGNOSIS — M810 Age-related osteoporosis without current pathological fracture: Secondary | ICD-10-CM

## 2020-10-05 DIAGNOSIS — I8393 Asymptomatic varicose veins of bilateral lower extremities: Secondary | ICD-10-CM

## 2020-10-05 MED ORDER — ALENDRONATE SODIUM 70 MG PO TABS: 70.0000 mg | ORAL_TABLET | ORAL | 11 refills | Status: DC

## 2020-10-05 NOTE — Patient Instructions (Addendum)
Give [place] 2-3 business days to get the results of your labs back.   Consider taking fish oil to see if it helps your memory. There is no strong data to support this though. Let me know if you would like to have a formal evaluation regarding this. Stay active and keep the diet healthy.   Use 1% hydrocortisone cream on your face. Stop using scented products and see how things go.   Let us know if you need anything.

## 2020-10-05 NOTE — Progress Notes (Signed)
Chief Complaint  Patient presents with   skin check    Subjective: Patient is a 85 y.o. female here for f/u.  Pt w hx of osteoporosis, currently on Prolia. It was $250 out of pocket. She has been on Fosamax in past, but it was many years ago.  She does take a calcium and vitamin D supplement daily.  She is very active in her yard.  She did notice some pain over her left tailbone area.  No injury or change in activity.  She wonders if it is related to her osteoporosis.  A few weeks ago, she had diarrhea that was black. This is slowly normalizing. She is no longer having lightheadedness.  She is not currently taking any anti-inflammatories or anticoagulation.  No injury to her bottom.  She does take an iron supplement daily.  Skin is breaking out on forehead. Only taking Allegra, Vit D/Ca supp and Fe supp. No new topicals. Has not tried anything on it so far.   Past Medical History:  Diagnosis Date   Abnormal cervical Papanicolaou smear 06/03/2017   Acute right ankle pain 04/06/2020   Bilateral carotid artery stenosis 09/30/2019   Cachexia ([place]) 05/22/2020   Chemical induced allergic contact dermatitis 05/28/2018   Closed compression fracture of L5 lumbar vertebra 06/03/2017   Pain control with scheduled Tylenol, prn Tramadol. Calcitonin x 2 months. PT. If not with adequate improvement in pain, to IR.    Compression fracture of body of thoracic vertebra ([place]) 11/28/2018   De Quervain's tenosynovitis, left 06/14/2018   Dry skin dermatitis 11/19/2018   Essential hypertension    Heart palpitations    Hypercholesterolemia    Left hip pain 09/30/2019   Low hemoglobin 12/11/2014   Patient was found at her gynecologic exam with Dr. Ulanda Edison to have a mildly low hemoglobin.  We will plan to check it next time along with an anemia panel.  In the meantime she will add a multivitamin with iron once a day    Mitral regurgitation    Mitral valve insufficiency 07/09/2019   Moderate aortic  insufficiency 09/02/2015   Echo 9/16: Normal LV function; mild LAE; trileaflet aortic valve with moderate (3+) central AI; mild to moderate MR; moderate TR.    Moderate protein-calorie malnutrition ([place]) 04/06/2020   Orthostatic hypotension    Osteoporosis 06/20/2011   Paroxysmal atrial tachycardia (HCC)    PVC's (premature ventricular contractions)    Squamous cell carcinoma, leg, right    Squamous cell carcinoma, leg, right    Stenosis of cervix 06/03/2017   Tachycardia 06/03/2017    Objective: BP 120/78 (BP Location: Left Arm, Patient Position: Sitting, Cuff Size: Normal)    Pulse (!) 50    Temp 98 F (36.7 C) (Oral)    Ht 5\' 5"  (1.651 m)    Wt 86 lb (39 kg)    SpO2 95%    BMI 14.31 kg/m  General: Awake, appears stated age HEENT: MMM, EOMi Heart: RRR, no lower extremity edema Lungs: CTAB, no rales, wheezes or rhonchi. No accessory muscle use Skin: Erythematous papules with slight excoriation on the face; varicose veins noted on the lower extremities MSK: No ttp over SI jt on right, there is tenderness on the left SI joint Psych: Age appropriate judgment and insight, normal affect and mood  Assessment and Plan: Osteoporosis, unspecified osteoporosis type, unspecified pathological fracture presence - Plan: alendronate (FOSAMAX) 70 MG tablet  [place] stool - Plan: CBC, Ferritin, Iron  Memory difficulties  Asymptomatic varicose veins  of both lower extremities  SI (sacroiliac) joint dysfunction   Rash  1.  Change Prolia to Fosamax 70 mg/week.  Continue calcium and vitamin D supplement.  Counseled on weightbearing activity. 2.  Check above blood work.  Likely resolving.  Continue iron supplement for now. 3.  Recommended fish oil though stated this is likely not going to help significantly.  Will consider neuropsychology evaluation if no improvement. 4.  Continue to wear compression stockings. 5.  Watchful waiting, injection if no improvement. 6.  Avoid scented products,  hydrocortisone cream on the face twice daily. F/u in 2 mo.  The patient voiced understanding and agreement to the plan.  [place], DO 10/05/20  10:49 AM

## 2020-10-06 ENCOUNTER — Ambulatory Visit: Payer: PPO | Admitting: Cardiology

## 2020-10-06 ENCOUNTER — Encounter: Payer: Self-pay | Admitting: Cardiology

## 2020-10-06 ENCOUNTER — Telehealth: Payer: Self-pay | Admitting: Family Medicine

## 2020-10-06 VITALS — BP 138/88 | HR 58 | Ht 65.0 in | Wt 87.0 lb

## 2020-10-06 DIAGNOSIS — I340 Nonrheumatic mitral (valve) insufficiency: Secondary | ICD-10-CM

## 2020-10-06 DIAGNOSIS — I10 Essential (primary) hypertension: Secondary | ICD-10-CM | POA: Diagnosis not present

## 2020-10-06 DIAGNOSIS — I351 Nonrheumatic aortic (valve) insufficiency: Secondary | ICD-10-CM | POA: Diagnosis not present

## 2020-10-06 DIAGNOSIS — E7800 Pure hypercholesterolemia, unspecified: Secondary | ICD-10-CM

## 2020-10-06 LAB — CBC
HCT: 38.4 % (ref 35.0–45.0)
Hemoglobin: 12.5 g/dL (ref 11.7–15.5)
MCH: 28.9 pg (ref 27.0–33.0)
MCHC: 32.6 g/dL (ref 32.0–36.0)
MCV: 88.7 fL (ref 80.0–100.0)
MPV: 10.4 fL (ref 7.5–12.5)
Platelets: 218 10*3/uL (ref 140–400)
RBC: 4.33 10*6/uL (ref 3.80–5.10)
RDW: 13.2 % (ref 11.0–15.0)
WBC: 5.5 10*3/uL (ref 3.8–10.8)

## 2020-10-06 LAB — FERRITIN: Ferritin: 33 ng/mL (ref 16–288)

## 2020-10-06 LAB — IRON: Iron: 84 ug/dL (ref 45–160)

## 2020-10-06 NOTE — Telephone Encounter (Signed)
Patient informed of PCP response °

## 2020-10-06 NOTE — Telephone Encounter (Signed)
We are close enough to the Prolia injection where we can wait until her dental work is complete. Ty.

## 2020-10-06 NOTE — Telephone Encounter (Signed)
Pt came in office stating was seen by provider yesterday (10-05-2020) and pt was given a rx of alendronate (FOSAMAX) 70 MG tablet, pt mentioned that can not take meds since she read rx bottle information that states  meds should not be taking if having any dental procedures - pt is having teeth transplant, pt would like to know if she can get different rx. Please advise. Pt tel [phone].

## 2020-10-06 NOTE — Progress Notes (Signed)
Cardiology Office Note:    Date:  10/06/2020   ID:  Samantha Gregory, [place] 1935-11-25, MRN [id number]  PCP:  [name], DO  Cardiologist:  Jenean Lindau, MD   Referring MD: [name]*    ASSESSMENT:    1. Hypercholesterolemia   2. Moderate aortic insufficiency   3. Essential hypertension   4. Mitral valve insufficiency, unspecified etiology    PLAN:    In order of problems listed above:  1. Primary prevention stressed with the patient.  Importance of compliance with diet medication stressed and she vocalized understanding. 2. Essential hypertension: Blood pressure stable and diet was emphasized 3. Aortic regurgitation: Asymptomatic.  Mild to moderate.  Will review with echocardiogram in the next few days 4. Mitral regurgitation: Mild to moderate and echocardiogram will help assess this.  Her blood pressure control is optimized and that certainly helps her valvular heart disease.  She had multiple questions which were answered to her satisfaction.Patient will be seen in follow-up appointment in 6 months or earlier if the patient has any concerns    Medication Adjustments/Labs and Tests Ordered: Current medicines are reviewed at length with the patient today.  Concerns regarding medicines are outlined above.  No orders of the defined types were placed in this encounter.  No orders of the defined types were placed in this encounter.    No chief complaint on file.    History of Present Illness:    Samantha Gregory is a 85 y.o. female.  Patient has past medical history of essential hypertension, moderate aortic and mitral insufficiency.  He denies any problems at this time and takes care of activities of daily living.  No chest pain orthopnea or PND.  She lives by herself and takes care of her needs by herself.  She has an independent home and she takes care of her yard.  No symptoms.  No chest pain orthopnea or PND.  At the time of my  evaluation, the patient is alert awake oriented and in no distress.  Past Medical History:  Diagnosis Date  . Abnormal cervical Papanicolaou smear 06/03/2017  . Acute right ankle pain 04/06/2020  . Bilateral carotid artery stenosis 09/30/2019  . Cachexia ([place]) 05/22/2020  . Chemical induced allergic contact dermatitis 05/28/2018  . Closed compression fracture of L5 lumbar vertebra 06/03/2017   Pain control with scheduled Tylenol, prn Tramadol. Calcitonin x 2 months. PT. If not with adequate improvement in pain, to IR.   Marland Kitchen Compression fracture of body of thoracic vertebra ([place]) 11/28/2018  . De Quervain's tenosynovitis, left 06/14/2018  . Dry skin dermatitis 11/19/2018  . Essential hypertension   . Heart palpitations   . Hypercholesterolemia   . Left hip pain 09/30/2019  . Low hemoglobin 12/11/2014   Patient was found at her gynecologic exam with Dr. Ulanda Edison to have a mildly low hemoglobin.  We will plan to check it next time along with an anemia panel.  In the meantime she will add a multivitamin with iron once a day   . Mitral regurgitation   . Mitral valve insufficiency 07/09/2019  . Moderate aortic insufficiency 09/02/2015   Echo 9/16: Normal LV function; mild LAE; trileaflet aortic valve with moderate (3+) central AI; mild to moderate MR; moderate TR.   . Moderate protein-calorie malnutrition ([place]) 04/06/2020  . Orthostatic hypotension   . Osteoporosis 06/20/2011  . Paroxysmal atrial tachycardia ([place])   . PVC's (premature ventricular contractions)   . Squamous cell carcinoma, leg, right   .  Squamous cell carcinoma, leg, right   . Stenosis of cervix 06/03/2017  . Tachycardia 06/03/2017    Past Surgical History:  Procedure Laterality Date  . APPENDECTOMY    . TEE WITHOUT CARDIOVERSION N/A 09/08/2015   Procedure: TRANSESOPHAGEAL ECHOCARDIOGRAM (TEE);  Surgeon: Lelon Perla, MD;  Location: [facility] ENDOSCOPY;  Service: Cardiovascular;  Laterality: N/A;    Current Medications: Current Meds    Medication Sig  . fexofenadine (ALLEGRA ALLERGY) 180 MG tablet Take 1 tablet (180 mg total) by mouth daily.  . metoprolol tartrate (LOPRESSOR) 25 MG tablet Take 1 tablet (25 mg total) by mouth 2 (two) times daily as needed (Use as needed for rapid heart beat.).  Marland Kitchen triamcinolone cream (KENALOG) 0.1 % APPLY TO AFFECTED AREA TWICE A DAY     Allergies:   Statins, Diazolidinyl urea [urea], Gold sodium thiosulfate, and Isothiazolinone chloride   Social History   Socioeconomic History  . Marital status: Widowed    Spouse name: Not on file  . Number of children: Not on file  . Years of education: Not on file  . Highest education level: Not on file  Occupational History  . Not on file  Tobacco Use  . Smoking status: Never Smoker  . Smokeless tobacco: Never Used  Vaping Use  . Vaping Use: Never used  Substance and Sexual Activity  . Alcohol use: Yes  . Drug use: No  . Sexual activity: Not on file  Other Topics Concern  . Not on file  Social History Narrative  . Not on file   Social Determinants of Health   Financial Resource Strain:   . Difficulty of Paying Living Expenses: Not on file  Food Insecurity:   . Worried About Charity fundraiser in the Last Year: Not on file  . Ran Out of Food in the Last Year: Not on file  Transportation Needs:   . Lack of Transportation (Medical): Not on file  . Lack of Transportation (Non-Medical): Not on file  Physical Activity:   . Days of Exercise per Week: Not on file  . Minutes of Exercise per Session: Not on file  Stress:   . Feeling of Stress : Not on file  Social Connections:   . Frequency of Communication with Friends and Family: Not on file  . Frequency of Social Gatherings with Friends and Family: Not on file  . Attends Religious Services: Not on file  . Active Member of Clubs or Organizations: Not on file  . Attends Archivist Meetings: Not on file  . Marital Status: Not on file     Family History: The patient's  family history includes Heart attack in her father; Heart disease in her brother, sister, and sister; Hypertension in her brother; Kidney disease in her mother; Stroke in her mother, paternal aunt, and paternal uncle.  ROS:   Please see the history of present illness.    All other systems reviewed and are negative.  EKGs/Labs/Other Studies Reviewed:    The following studies were reviewed today: EKG reveals sinus bradycardia nonspecific ST-T changes   Recent Labs: 11/04/2019: ALT 24; BUN 20; Creatinine, Ser 0.70; Potassium 3.9; Sodium 141 09/04/2020: TSH 3.91 10/05/2020: Hemoglobin 12.5; Platelets 218  Recent Lipid Panel    Component Value Date/Time   CHOL 219 (H) 11/04/2019 0813   TRIG 65 11/04/2019 0813   HDL 87 11/04/2019 0813   CHOLHDL 2.5 11/04/2019 0813   CHOLHDL 2.3 12/30/2015 0741   VLDL 17 12/30/2015 0741  LDLCALC 121 (H) 11/04/2019 0813   LDLDIRECT 117.0 02/05/2014 0812    Physical Exam:    VS:  BP 138/88   Pulse (!) 58   Ht 5\' 5"  (1.651 m)   Wt 87 lb 0.6 oz (39.5 kg)   SpO2 99%   BMI 14.48 kg/m     Wt Readings from Last 3 Encounters:  10/06/20 87 lb 0.6 oz (39.5 kg)  10/05/20 86 lb (39 kg)  09/04/20 86 lb 6 oz (39.2 kg)     GEN: Patient is in no acute distress HEENT: Normal NECK: No JVD; No carotid bruits LYMPHATICS: No lymphadenopathy CARDIAC: Hear sounds regular, 2/6 systolic murmur at the apex. RESPIRATORY:  Clear to auscultation without rales, wheezing or rhonchi  ABDOMEN: Soft, non-tender, non-distended MUSCULOSKELETAL:  No edema; No deformity  SKIN: Warm and dry NEUROLOGIC:  Alert and oriented x 3 PSYCHIATRIC:  Normal affect   Signed, Jenean Lindau, MD  10/06/2020 8:33 AM    [place]

## 2020-10-06 NOTE — Patient Instructions (Signed)
Medication Instructions:  No medication changes. *If you need a refill on your cardiac medications before your next appointment, please call your pharmacy*   Lab Work: None ordered If you have labs (blood work) drawn today and your tests are completely normal, you will receive your results only by: . MyChart Message (if you have MyChart) OR . A paper copy in the mail If you have any lab test that is abnormal or we need to change your treatment, we will call you to review the results.   Testing/Procedures: Your physician has requested that you have an echocardiogram. Echocardiography is a painless test that uses sound waves to create images of your heart. It provides your doctor with information about the size and shape of your heart and how well your heart's chambers and valves are working. This procedure takes approximately one hour. There are no restrictions for this procedure.     Follow-Up: At [facility], you and your health needs are our priority.  As part of our continuing mission to provide you with exceptional heart care, we have created designated Provider Care Teams.  These Care Teams include your primary Cardiologist (physician) and Advanced Practice Providers (APPs -  Physician Assistants and Nurse Practitioners) who all work together to provide you with the care you need, when you need it.  We recommend signing up for the patient portal called "MyChart".  Sign up information is provided on this After Visit Summary.  MyChart is used to connect with patients for Virtual Visits (Telemedicine).  Patients are able to view lab/test results, encounter notes, upcoming appointments, etc.  Non-urgent messages can be sent to your provider as well.   To learn more about what you can do with MyChart, go to [email or web address].    Your next appointment:   6 month(s)  The format for your next appointment:   In Person  Provider:   Rajan Revankar, MD   Other  Instructions  Echocardiogram An echocardiogram is a procedure that uses painless sound waves (ultrasound) to produce an image of the heart. Images from an echocardiogram can provide important information about:  Signs of coronary artery disease (CAD).  Aneurysm detection. An aneurysm is a weak or damaged part of an artery wall that bulges out from the normal force of blood pumping through the body.  Heart size and shape. Changes in the size or shape of the heart can be associated with certain conditions, including heart failure, aneurysm, and CAD.  Heart muscle function.  Heart valve function.  Signs of a past heart attack.  Fluid buildup around the heart.  Thickening of the heart muscle.  A tumor or infectious growth around the heart valves. Tell a health care provider about:  Any allergies you have.  All medicines you are taking, including vitamins, herbs, eye drops, creams, and over-the-counter medicines.  Any blood disorders you have.  Any surgeries you have had.  Any medical conditions you have.  Whether you are pregnant or may be pregnant. What are the risks? Generally, this is a safe procedure. However, problems may occur, including:  Allergic reaction to dye (contrast) that may be used during the procedure. What happens before the procedure? No specific preparation is needed. You may eat and drink normally. What happens during the procedure?   An IV tube may be inserted into one of your veins.  You may receive contrast through this tube. A contrast is an injection that improves the quality of the pictures from your heart.  A   gel will be applied to your chest.  A wand-like tool (transducer) will be moved over your chest. The gel will help to transmit the sound waves from the transducer.  The sound waves will harmlessly bounce off of your heart to allow the heart images to be captured in real-time motion. The images will be recorded on a computer. The  procedure may vary among health care providers and hospitals. What happens after the procedure?  You may return to your normal, everyday life, including diet, activities, and medicines, unless your health care provider tells you not to do that. Summary  An echocardiogram is a procedure that uses painless sound waves (ultrasound) to produce an image of the heart.  Images from an echocardiogram can provide important information about the size and shape of your heart, heart muscle function, heart valve function, and fluid buildup around your heart.  You do not need to do anything to prepare before this procedure. You may eat and drink normally.  After the echocardiogram is completed, you may return to your normal, everyday life, unless your health care provider tells you not to do that. This information is not intended to replace advice given to you by your health care provider. Make sure you discuss any questions you have with your health care provider. Document Revised: 04/04/2019 Document Reviewed: 01/14/2017 Elsevier Patient Education  2020 [employer] Inc[employer]   

## 2020-10-12 ENCOUNTER — Telehealth: Payer: Self-pay | Admitting: Family Medicine

## 2020-10-12 NOTE — Telephone Encounter (Signed)
Called left message to call back 

## 2020-10-12 NOTE — Telephone Encounter (Signed)
Patient states pharmacy is calling her to pick up medication. She would like to speak to you in regards to medication

## 2020-10-12 NOTE — Telephone Encounter (Signed)
Informed that as of telephone note dated 10/06/20 PCP agreed due to dental work do not take Fosamax at this time and was the medication pharmacy was calling in regards to. She will let the pharmacy know will not be picking up.

## 2020-10-19 ENCOUNTER — Ambulatory Visit (HOSPITAL_BASED_OUTPATIENT_CLINIC_OR_DEPARTMENT_OTHER)
Admission: RE | Admit: 2020-10-19 | Discharge: 2020-10-19 | Disposition: A | Discharge status: Home / Self Care | Payer: PPO | Source: Ambulatory Visit | Attending: Cardiology | Admitting: Cardiology

## 2020-10-19 DIAGNOSIS — I351 Nonrheumatic aortic (valve) insufficiency: Secondary | ICD-10-CM | POA: Insufficient documentation

## 2020-10-19 DIAGNOSIS — I340 Nonrheumatic mitral (valve) insufficiency: Secondary | ICD-10-CM | POA: Insufficient documentation

## 2020-10-20 LAB — ECHOCARDIOGRAM COMPLETE
Area-P 1/2: 3.93 cm2
P 1/2 time: 462 msec
S' Lateral: 2.71 cm

## 2020-10-22 ENCOUNTER — Telehealth: Payer: Self-pay | Admitting: Cardiology

## 2020-10-22 NOTE — Telephone Encounter (Signed)
Spoke with patient regarding results and recommendation.  Patient verbalizes understanding and is agreeable to plan of care. Advised patient to call back with any issues or concerns.  

## 2020-10-22 NOTE — Telephone Encounter (Signed)
° °  Pt is returning call to get echo result °

## 2020-12-07 ENCOUNTER — Ambulatory Visit: Payer: PPO | Attending: Internal Medicine

## 2020-12-07 ENCOUNTER — Other Ambulatory Visit (HOSPITAL_BASED_OUTPATIENT_CLINIC_OR_DEPARTMENT_OTHER): Payer: Self-pay | Admitting: Internal Medicine

## 2020-12-07 DIAGNOSIS — Z23 Encounter for immunization: Secondary | ICD-10-CM

## 2020-12-14 MED FILL — PFIZER-BIONTECH COVID-19 VA: 30 | 1 days supply | Qty: 0 | Fill #0

## 2021-03-09 ENCOUNTER — Ambulatory Visit (HOSPITAL_BASED_OUTPATIENT_CLINIC_OR_DEPARTMENT_OTHER)
Admission: RE | Admit: 2021-03-09 | Discharge: 2021-03-09 | Disposition: A | Discharge status: Home / Self Care | Payer: PPO | Source: Ambulatory Visit | Attending: Family Medicine | Admitting: Family Medicine

## 2021-03-09 ENCOUNTER — Other Ambulatory Visit: Payer: Self-pay

## 2021-03-09 ENCOUNTER — Encounter: Payer: Self-pay | Admitting: Family Medicine

## 2021-03-09 ENCOUNTER — Ambulatory Visit (INDEPENDENT_AMBULATORY_CARE_PROVIDER_SITE_OTHER): Payer: PPO | Admitting: Family Medicine

## 2021-03-09 VITALS — BP 132/80 | HR 66 | Temp 97.5°F | Resp 15 | Ht 65.0 in | Wt 82.0 lb

## 2021-03-09 DIAGNOSIS — R64 Cachexia: Secondary | ICD-10-CM | POA: Diagnosis not present

## 2021-03-09 DIAGNOSIS — S3992XA Unspecified injury of lower back, initial encounter: Secondary | ICD-10-CM

## 2021-03-09 DIAGNOSIS — I471 Supraventricular tachycardia: Secondary | ICD-10-CM | POA: Diagnosis not present

## 2021-03-09 DIAGNOSIS — M545 Low back pain, unspecified: Secondary | ICD-10-CM | POA: Diagnosis not present

## 2021-03-09 DIAGNOSIS — M546 Pain in thoracic spine: Secondary | ICD-10-CM | POA: Diagnosis not present

## 2021-03-09 MED ORDER — IBUPROFEN 200 MG PO TABS
200.0000 mg | ORAL_TABLET | Freq: Once | ORAL | Status: AC
  Administered 2021-03-09: 200 mg via ORAL

## 2021-03-09 NOTE — Progress Notes (Signed)
Musculoskeletal Exam  Patient: Samantha Gregory DOB: 07-17-1935  DOS: 03/09/2021  SUBJECTIVE:  Chief Complaint:   Chief Complaint  Patient presents with  . Back Pain    1 week ago today, picked up heavy item and strained muscle, lower back pain/hip, would like xray     Samantha Gregory is a 86 y.o.  female for evaluation and treatment of back pain.   Onset:  1 week ago.  Picked up something heavy.  Location: lower back/hip Character:  aching and sharp  Progression of issue:  is unchanged Associated symptoms: no bruising, redness, swelling Denies bowel/bladder incontinence or weakness Treatment: to date has been rest and OTC NSAIDS.   Neurovascular symptoms: no  Still having splitting nails and concern about memory.  She is questioning whether she should take prednisone or not.  She is taking 1200 mg of fish oil, vitamin D, vitamin B12, and vitamin C.  She is wondering if she needs to take allergies.  Past Medical History:  Diagnosis Date  . Abnormal cervical Papanicolaou smear 06/03/2017  . Acute right ankle pain 04/06/2020  . Bilateral carotid artery stenosis 09/30/2019  . Cachexia ([employer]) 05/22/2020  . Chemical induced allergic contact dermatitis 05/28/2018  . Closed compression fracture of L5 lumbar vertebra 06/03/2017   Pain control with scheduled Tylenol, prn Tramadol. Calcitonin x 2 months. PT. If not with adequate improvement in pain, to IR.   Marland Kitchen Compression fracture of body of thoracic vertebra ([place]) 11/28/2018  . De Quervain's tenosynovitis, left 06/14/2018  . Dry skin dermatitis 11/19/2018  . Essential hypertension   . Heart palpitations   . Hypercholesterolemia   . Left hip pain 09/30/2019  . Low hemoglobin 12/11/2014   Patient was found at her gynecologic exam with Dr. Ulanda Edison to have a mildly low hemoglobin.  We will plan to check it next time along with an anemia panel.  In the meantime she will add a multivitamin with iron once a day   . Mitral regurgitation   .  Mitral valve insufficiency 07/09/2019  . Moderate aortic insufficiency 09/02/2015   Echo 9/16: Normal LV function; mild LAE; trileaflet aortic valve with moderate (3+) central AI; mild to moderate MR; moderate TR.   . Moderate protein-calorie malnutrition ([place]) 04/06/2020  . Orthostatic hypotension   . Osteoporosis 06/20/2011  . Paroxysmal atrial tachycardia ([place])   . PVC's (premature ventricular contractions)   . Squamous cell carcinoma, leg, right   . Squamous cell carcinoma, leg, right   . Stenosis of cervix 06/03/2017  . Tachycardia 06/03/2017    Objective:  VITAL SIGNS: BP 132/80 (BP Location: Left Arm, Patient Position: Sitting, Cuff Size: Normal)   Pulse 66   Temp (!) 97.5 F (36.4 C)   Resp 15   Ht 5\' 5"  (1.651 m)   Wt 82 lb (37.2 kg)   SpO2 99%   BMI 13.65 kg/m  Constitutional: Well formed, well developed. No acute distress. HENT: Normocephalic, atraumatic.  Thorax & Lungs:  No accessory muscle use Musculoskeletal: low back.   Tenderness to palpation: There is tenderness to palpation over the mid thoracic paraspinal musculature bilaterally, worse on the right; there is no midline tenderness Deformity: no Ecchymosis: no Straight leg test: negative for Poor hamstring flexibility b/l. Neurologic: Normal sensory function. No focal deficits noted. DTR's equal and symmetric in UE's/LE's. No clonus. Psychiatric: Normal mood. Age appropriate judgment and insight. Alert & oriented x 3.    Assessment:  Acute midline low back pain, unspecified whether sciatica  present - Plan: DG Lumbar Spine 2-3 Views, ibuprofen (ADVIL) tablet 200 mg  Injury of back, initial encounter - Plan: DG Thoracic Spine 2 View  Cachexia [facility]), Chronic  PAT (paroxysmal atrial tachycardia) ([place]), Chronic  Plan: Stretches/exercises, heat, ice, Tylenol.  Compression fractures noted on the films.  Interestingly, her areas of pain or not where she has a compression fractures.  I did call the patient after the  report came back and will offer referral to the neurosurgery team, though she will not likely take me up on this. She can continue vitamin D for bone health, unless evidence for vitamin C and B vitamins for dementia prevention. F/u prn. The patient voiced understanding and agreement to the plan.   [place], DO 03/09/21  1:26 PM

## 2021-03-09 NOTE — Patient Instructions (Addendum)
Heat (pad or rice pillow in microwave) over affected area, 10-15 minutes twice daily.   Ice/cold pack over area for 10-15 min twice daily.  OK to take Tylenol 1000 mg (2 extra strength tabs) or 975 mg (3 regular strength tabs) every 6 hours as needed.  Let us know if you need anything.   Mid-Back Strain Rehab It is normal to feel mild stretching, pulling, tightness, or discomfort as you do these exercises, but you should stop right away if you feel sudden pain or your pain gets worse.   Stretching and range of motion exercises This exercise warms up your muscles and joints and improves the movement and flexibility of your back and shoulders. This exercise also help to relieve pain. Exercise A: Chest and spine stretch    1. Lie down on your back on a firm surface. 2. Roll a towel or a small blanket so it is about 4 inches (10 cm) in diameter. 3. Put the towel lengthwise under the middle of your back so it is under your spine, but not under your shoulder blades. 4. To increase the stretch, you may put your hands behind your head and let your elbows fall to your sides. 5. Hold for 30 seconds. Repeat exercise 2 times. Complete this exercise 3 times per week.  Strengthening exercises These exercises build strength and endurance in your back and your shoulder blade muscles. Endurance is the ability to use your muscles for a long time, even after they get tired. Exercise B: Alternating arm and leg raises    1. Get on your hands and knees on a firm surface. If you are on a hard floor, you may want to use padding to cushion your knees, such as an exercise mat. 2. Line up your arms and legs. Your hands should be below your shoulders, and your knees should be below your hips. 3. Lift your left leg behind you. At the same time, raise your right arm and straighten it in front of you. ? Do not lift your leg higher than your hip. ? Do not lift your arm higher than your shoulder. ? Keep your  abdominal and back muscles tight. ? Keep your hips facing the ground. ? Do not arch your back. ? Keep your balance carefully, and do not hold your breath. 4. Hold for 3 seconds. 5. Slowly return to the starting position and repeat with your right leg and your left arm. Repeat 2 times. Complete this exercise 3 times per week. Exercise C: Straight arm rows (shoulder extension)     1. Stand with your feet shoulder width apart. 2. Secure an exercise band to a stable object in front of you so the band is at or above shoulder height. 3. Hold one end of the exercise band in each hand. 4. Straighten your elbows and lift your hands up to shoulder height. 5. Step back, away from the secured end of the exercise band, until the band stretches. 6. Squeeze your shoulder blades together and pull your hands down to the sides of your thighs. Stop when your hands are straight down by your sides. Do not let your hands go behind your body. 7. Hold for 3 seconds. 8. Slowly return to the starting position. Repeat 2 times. Complete this exercise 3 times per week. Exercise D: Shoulder external rotation, prone 1. Lie on your abdomen on a firm bed so your left / right forearm hangs over the edge of the bed and your upper arm  is on the bed, straight out from your body. ? Your elbow should be bent. ? Your palm should be facing your feet. 2. If instructed, hold a 5 lb weight in your hand. 3. Squeeze your shoulder blade toward the middle of your back. Do not let your shoulder lift toward your ear. 4. Keep your elbow bent in an "L" shape (90 degrees) while you slowly move your forearm up toward the ceiling. Move your forearm up to the height of the bed, toward your head. ? Your upper arm should not move. ? At the top of the movement, your palm should face the floor. 5. Hold for 3 seconds. 6. Slowly return to the starting position and relax your muscles. Repeat 3 times. Complete this exercise 3 times per  week. Exercise E: Scapular retraction and external rotation, rowing    1. Sit in a stable chair without armrests, or stand. 2. Secure an exercise band to a stable object in front of you so it is at shoulder height. 3. Hold one end of the exercise band in each hand. 4. Bring your arms out straight in front of you. 5. Step back, away from the secured end of the exercise band, until the band stretches. 6. Pull the band backward. As you do this, bend your elbows and squeeze your shoulder blades together, but avoid letting the rest of your body move. Do not let your shoulders lift up toward your ears. 7. Stop when your elbows are at your sides or slightly behind your body. 8. Hold for 5 seconds. 9. Slowly straighten your arms to return to the starting position. Repeat 2 times. Complete this exercise 3 times per week. Posture and body mechanics    Body mechanics refers to the movements and positions of your body while you do your daily activities. Posture is part of body mechanics. Good posture and healthy body mechanics can help to relieve stress in your body's tissues and joints. Good posture means that your spine is in its natural S-curve position (your spine is neutral), your shoulders are pulled back slightly, and your head is not tipped forward. The following are general guidelines for applying improved posture and body mechanics to your everyday activities. Standing     When standing, keep your spine neutral and your feet about hip-width apart. Keep a slight bend in your knees. Your ears, shoulders, and hips should line up.  When you do a task in which you lean forward while standing in one place for a long time, place one foot up on a stable object that is 2-4 inches (5-10 cm) high, such as a footstool. This helps keep your spine neutral. Sitting     When sitting, keep your spine neutral and keep your feet flat on the floor. Use a footrest, if necessary, and keep your thighs parallel  to the floor. Avoid rounding your shoulders, and avoid tilting your head forward.  When working at a desk or a computer, keep your desk at a height where your hands are slightly lower than your elbows. Slide your chair under your desk so you are close enough to maintain good posture.  When working at a computer, place your monitor at a height where you are looking straight ahead and you do not have to tilt your head forward or downward to look at the screen. Resting    When lying down and resting, avoid positions that are most painful for you.  If you have pain with activities such  as sitting, bending, stooping, or squatting (flexion-based activities), lie in a position in which your body does not bend very much. For example, avoid curling up on your side with your arms and knees near your chest (fetal position).  If you have pain with activities such as standing for a long time or reaching with your arms (extension-based activities), lie with your spine in a neutral position and bend your knees slightly. Try the following positions:  Lying on your side with a pillow between your knees.  Lying on your back with a pillow under your knees.   Lifting     When lifting objects, keep your feet at least shoulder-width apart and tighten your abdominal muscles.  Bend your knees and hips and keep your spine neutral. It is important to lift using the strength of your legs, not your back. Do not lock your knees straight out.  Always ask for help to lift heavy or awkward objects. Make sure you discuss any questions you have with your health care provider.

## 2021-03-15 ENCOUNTER — Other Ambulatory Visit: Payer: Self-pay

## 2021-03-15 ENCOUNTER — Ambulatory Visit (INDEPENDENT_AMBULATORY_CARE_PROVIDER_SITE_OTHER): Payer: PPO | Admitting: Family Medicine

## 2021-03-15 ENCOUNTER — Ambulatory Visit (HOSPITAL_BASED_OUTPATIENT_CLINIC_OR_DEPARTMENT_OTHER): Admission: RE | Admit: 2021-03-15 | Payer: PPO | Source: Ambulatory Visit

## 2021-03-15 ENCOUNTER — Ambulatory Visit (HOSPITAL_BASED_OUTPATIENT_CLINIC_OR_DEPARTMENT_OTHER)
Admission: RE | Admit: 2021-03-15 | Discharge: 2021-03-15 | Disposition: A | Discharge status: Home / Self Care | Payer: PPO | Source: Ambulatory Visit | Attending: Family Medicine | Admitting: Family Medicine

## 2021-03-15 ENCOUNTER — Telehealth: Payer: Self-pay

## 2021-03-15 ENCOUNTER — Encounter: Payer: Self-pay | Admitting: Family Medicine

## 2021-03-15 ENCOUNTER — Encounter (HOSPITAL_BASED_OUTPATIENT_CLINIC_OR_DEPARTMENT_OTHER): Payer: Self-pay

## 2021-03-15 VITALS — BP 162/90 | HR 62 | Temp 98.0°F | Ht 65.0 in | Wt 83.4 lb

## 2021-03-15 DIAGNOSIS — S22000A Wedge compression fracture of unspecified thoracic vertebra, initial encounter for closed fracture: Secondary | ICD-10-CM | POA: Diagnosis not present

## 2021-03-15 DIAGNOSIS — M25559 Pain in unspecified hip: Secondary | ICD-10-CM | POA: Diagnosis not present

## 2021-03-15 DIAGNOSIS — S32000A Wedge compression fracture of unspecified lumbar vertebra, initial encounter for closed fracture: Secondary | ICD-10-CM

## 2021-03-15 DIAGNOSIS — M160 Bilateral primary osteoarthritis of hip: Secondary | ICD-10-CM | POA: Diagnosis not present

## 2021-03-15 MED ORDER — TIZANIDINE HCL 4 MG PO TABS
2.0000 mg | ORAL_TABLET | Freq: Four times a day (QID) | ORAL | 0 refills | Status: DC | PRN
Start: 2021-03-15 — End: 2021-06-08

## 2021-03-15 MED ORDER — TRAMADOL HCL 50 MG PO TABS
50.0000 mg | ORAL_TABLET | Freq: Three times a day (TID) | ORAL | 0 refills | Status: AC | PRN
Start: 2021-03-15 — End: 2021-03-20

## 2021-03-15 NOTE — Telephone Encounter (Signed)
Caller states she needs x-rays on both hips. She has a lot of pain with trouble getting up and down

## 2021-03-15 NOTE — Progress Notes (Signed)
Chief Complaint  Patient presents with  . Hip Pain    Subjective: Patient is a 86 y.o. female here for f/u hip pain.  The patient was seen on 3/15 and diagnosed with thoracic and lumbar vertebral compression fractures.  She was not having significant pain in the low back region but was having some of the thoracic spine area.  It seems more over the musculature and not overly to the bony midline.  Thus, the decision to partake in watchful waiting was made.  She feels that her hips are more painful in addition to the lower back area, but not over the midline.  She has not been active in the last week.  This initially started after she carried several cinderblocks from her shed to her house.  She denies any bruising, redness, or swelling.  She has great difficulty standing up straight or getting up from a seated or lying down position without pain.  She has been taking Tylenol without relief.  She declined any further medication at last visit.  No bowel or bladder incontinence, no neurologic signs or symptoms.  Past Medical History:  Diagnosis Date  . Abnormal cervical Papanicolaou smear 06/03/2017  . Acute right ankle pain 04/06/2020  . Bilateral carotid artery stenosis 09/30/2019  . Cachexia ([place]) 05/22/2020  . Chemical induced allergic contact dermatitis 05/28/2018  . Closed compression fracture of L5 lumbar vertebra 06/03/2017   Pain control with scheduled Tylenol, prn Tramadol. Calcitonin x 2 months. PT. If not with adequate improvement in pain, to IR.   Marland Kitchen Compression fracture of body of thoracic vertebra ([place]) 11/28/2018  . De Quervain's tenosynovitis, left 06/14/2018  . Dry skin dermatitis 11/19/2018  . Essential hypertension   . Heart palpitations   . Hypercholesterolemia   . Left hip pain 09/30/2019  . Low hemoglobin 12/11/2014   Patient was found at her gynecologic exam with Dr. Ulanda Edison to have a mildly low hemoglobin.  We will plan to check it next time along with an anemia panel.  In the  meantime she will add a multivitamin with iron once a day   . Mitral regurgitation   . Mitral valve insufficiency 07/09/2019  . Moderate aortic insufficiency 09/02/2015   Echo 9/16: Normal LV function; mild LAE; trileaflet aortic valve with moderate (3+) central AI; mild to moderate MR; moderate TR.   . Moderate protein-calorie malnutrition ([place]) 04/06/2020  . Orthostatic hypotension   . Osteoporosis 06/20/2011  . Paroxysmal atrial tachycardia ([place])   . PVC's (premature ventricular contractions)   . Squamous cell carcinoma, leg, right   . Squamous cell carcinoma, leg, right   . Stenosis of cervix 06/03/2017  . Tachycardia 06/03/2017    Objective: BP (!) 162/90 (BP Location: Left Arm, Patient Position: Sitting, Cuff Size: Normal)   Pulse 62   Temp 98 F (36.7 C) (Oral)   Ht 5\' 5"  (1.651 m)   Wt 83 lb 6 oz (37.8 kg)   SpO2 98%   BMI 13.87 kg/m  General: Awake, appears stated age MSK: Significant pain over the paravertebral gutter on the right in the thoracic region, no midline tenderness throughout the lumbar or thoracic region.  There is minimal tenderness over the iliac crests and SI joint bilaterally.  I do not appreciate any deformity.   Neuro: Antalgic gait, DTRs equal and symmetric without clonus in the lower extremities Lungs: No accessory muscle use Psych: Age appropriate judgment and insight, normal affect and mood  Assessment and Plan: Compression fracture of body of  thoracic vertebra ([place]) - Plan: Ambulatory referral to Neurosurgery  Compression fracture of lumbar vertebra, initial encounter ([place]) - Plan: Ambulatory referral to Neurosurgery, MR Lumbar Spine Wo Contrast  Hip pain - Plan: DG HIPS BILAT WITH PELVIS MIN 5 VIEWS  1/2.  Check MRI.  I would like her to see a neurosurgeon for their opinion.  Warnings about tramadol and tizanidine verbalized. 3.  Check x-ray. Follow-up pending the above. The patient voiced understanding and agreement to the plan.  [place], DO 03/15/21  11:47 AM

## 2021-03-15 NOTE — Patient Instructions (Addendum)
No lifting.  Ice/cold pack over area for 10-15 min twice daily.  Heat (pad or rice pillow in microwave) over affected area, 10-15 minutes twice daily.   Continue Tylenol.  The new medicines may cause drowsiness.  If you do not hear anything about your referral in the next 1-2 weeks, call our office and ask for an update.  Someone will reach out regarding your MRI.   Let us know if you need anything.

## 2021-03-15 NOTE — Telephone Encounter (Signed)
Appt. With PCP is scheduled for today 03/15/21

## 2021-03-15 NOTE — Telephone Encounter (Signed)
Nurse Assessment Nurse: Laurann Montana, RN, Fransico Meadow Date/Time Eilene Ghazi Time): 03/14/2021 10:15:13 AM Confirm and document reason for call. If symptomatic, describe symptoms. ---Caller is having bilateral hip pain. Caller is requesting a xray for this. Caller states no other symptoms. Caller states she is going to call office tomorrow to speak directly to MD. Does the patient have any new or worsening symptoms? ---Yes Will a triage be completed? ---Yes Related visit to physician within the last 2 weeks? ---No Does the PT have any chronic conditions? (i.e. diabetes, asthma, this includes High risk factors for pregnancy, etc.) ---No Is this a behavioral health or substance abuse call? ---No Guidelines Guideline Title Affirmed Question Affirmed Notes Nurse Date/Time (Eastern Time) Hip Pain [1] MODERATE pain (e.g., interferes with normal activities, limping) AND [2] present > 3 days Laurann Montana, RN, Fransico Meadow 03/14/2021 10:16:01 AM Disp. Time Eilene Ghazi Time) Disposition Final User 03/14/2021 10:22:18 AM SEE PCP WITHIN 3 DAYS Yes Laurann Montana, RN, Fransico Meadow PLEASE NOTE: All timestamps contained within this report are represented as [place] Standard Time. CONFIDENTIALTY NOTICE: This fax transmission is intended only for the addressee. It contains information that is legally privileged, confidential or otherwise protected from use or disclosure. If you are not the intended recipient, you are strictly prohibited from reviewing, disclosing, copying using or disseminating any of this information or taking any action in reliance on or regarding this information. If you have received this fax in error, please notify us immediately by telephone so that we can arrange for its return to [place]. Phone: [phone], Toll-Free: [phone], Fax: [phone] Page: 2 of 2 Call Id: [id number] Caller Disagree/Comply Comply Caller Understands Yes PreDisposition Call Doctor Care Advice Given Per Guideline SEE  PCP WITHIN 3 DAYS: * You need to be seen within 2 or 3 days. REST YOUR HIP FOR THE NEXT COUPLE DAYS: * Avoid activities that worsen your pain. LOCAL HEAT: * Apply a warm wet washcloth or heating pad for 10 minutes three times daily. PAIN MEDICINES: * For pain relief, you can take either acetaminophen, ibuprofen, or naproxen. * ACETAMINOPHEN - REGULAR STRENGTH TYLENOL: Take 650 mg (two 325 mg pills) by mouth every 4 to 6 hours as needed. Each Regular Strength Tylenol pill has 325 mg of acetaminophen. The most you should take each day is 3,250 mg (10 pills a day). * This will help to increase circulation and improve healing. CALL BACK IF: * Fever or severe pain occurs * Skin redness or a rash appears * You become worse CARE ADVICE given per Hip Pain (Adult) guideline

## 2021-03-17 ENCOUNTER — Telehealth: Payer: Self-pay | Admitting: Family Medicine

## 2021-03-17 DIAGNOSIS — S32000A Wedge compression fracture of unspecified lumbar vertebra, initial encounter for closed fracture: Secondary | ICD-10-CM

## 2021-03-17 DIAGNOSIS — S22000A Wedge compression fracture of unspecified thoracic vertebra, initial encounter for closed fracture: Secondary | ICD-10-CM

## 2021-03-17 NOTE — Telephone Encounter (Signed)
Called the patient informed of PCP instructions regarding Tramadol. She verbalized understanding and agreed to do so. She has eaten breakfast and is feeling better She is calling her neighbor now and for the remainder of the day will rest on the couch on her heating pad. She did verbalize appreciation for the call and thanked her PCP for all his help

## 2021-03-17 NOTE — Telephone Encounter (Signed)
PCP stated for her to stop muscle relaxer. Patient took at 6 AM--tylenol 250 mg, tizanidine (she took 1/2 tablet), tramadol (took whole pill-50 mg), Informed her to not take the tizanidine again/PCP instructions. She is now sitting down on a heating pad. She is going to eat some breakfast/drink something--cinnamon bun, sausage pattie and coffee. She is very aware she should not have taken the meds on an empty stomach. She is going to call her neighbor to sit with her/check on her right now.

## 2021-03-17 NOTE — Telephone Encounter (Signed)
[  8:36 AM] Samantha Gregory [facility]  [place] ladies.Marland KitchenMarland KitchenI'm very concerned about MRN [id number] Samantha Gregory. I just spoke to her about setting up her MRI for Saturday. She said that she took all her medications at once and she's very nauseated and lathargic. She lives alone. She barely got through the phone call with me with out nodding off and them coming back to. Is there something we can do to maybe check on her or have a welfare check on her?

## 2021-03-17 NOTE — Telephone Encounter (Signed)
Please have her take 1/2 tab of tramadol moving forward as well. Ty.

## 2021-03-18 NOTE — Telephone Encounter (Signed)
Patient said to do the referral and hopefully she can get at ride.  She would like to keep the MRI scheduled just in case.

## 2021-03-18 NOTE — Telephone Encounter (Signed)
See if we can place an urgent referral to ortho spine or neurosurgery for compression fractures (same dx as in my notes) to get her in today or tomorrow. Ty.

## 2021-03-18 NOTE — Telephone Encounter (Signed)
Caller : Azuree  Call Back @ [phone]  Patient states her hip is hurting, Patient states the Tramadol is not touching the pain. Patient believes she can not wait until Saturday for scan of her hip.  Please Advise

## 2021-03-18 NOTE — Telephone Encounter (Addendum)
Spoke with the [facility] and they will not schedule her until she gets MRI.   Patient is taking only 1/2 tablet.  She stated that she was not sure how often to take it.  Advised 1/2 tablet tid prn.    Advised her that neurosurgery will be calling her to schedule for possibly next week as the MRI scans should hopefully be back.

## 2021-03-20 ENCOUNTER — Ambulatory Visit (HOSPITAL_BASED_OUTPATIENT_CLINIC_OR_DEPARTMENT_OTHER)
Admission: RE | Admit: 2021-03-20 | Discharge: 2021-03-20 | Disposition: A | Discharge status: Home / Self Care | Payer: PPO | Source: Ambulatory Visit | Attending: Family Medicine | Admitting: Family Medicine

## 2021-03-20 ENCOUNTER — Ambulatory Visit (HOSPITAL_BASED_OUTPATIENT_CLINIC_OR_DEPARTMENT_OTHER): Admission: RE | Admit: 2021-03-20 | Payer: PPO | Source: Ambulatory Visit

## 2021-03-20 ENCOUNTER — Other Ambulatory Visit: Payer: Self-pay

## 2021-03-20 DIAGNOSIS — M545 Low back pain, unspecified: Secondary | ICD-10-CM | POA: Diagnosis not present

## 2021-03-20 DIAGNOSIS — S32000A Wedge compression fracture of unspecified lumbar vertebra, initial encounter for closed fracture: Secondary | ICD-10-CM | POA: Diagnosis not present

## 2021-03-22 DIAGNOSIS — S22080A Wedge compression fracture of T11-T12 vertebra, initial encounter for closed fracture: Secondary | ICD-10-CM | POA: Diagnosis not present

## 2021-06-08 ENCOUNTER — Other Ambulatory Visit: Payer: Self-pay

## 2021-06-08 ENCOUNTER — Ambulatory Visit (INDEPENDENT_AMBULATORY_CARE_PROVIDER_SITE_OTHER): Payer: PPO | Admitting: Family Medicine

## 2021-06-08 ENCOUNTER — Ambulatory Visit (HOSPITAL_BASED_OUTPATIENT_CLINIC_OR_DEPARTMENT_OTHER)
Admission: RE | Admit: 2021-06-08 | Discharge: 2021-06-08 | Disposition: A | Discharge status: Home / Self Care | Payer: PPO | Source: Ambulatory Visit | Attending: Family Medicine | Admitting: Family Medicine

## 2021-06-08 ENCOUNTER — Encounter: Payer: Self-pay | Admitting: Family Medicine

## 2021-06-08 VITALS — BP 120/78 | HR 78 | Temp 97.9°F | Ht 65.0 in | Wt 78.2 lb

## 2021-06-08 DIAGNOSIS — M4854XA Collapsed vertebra, not elsewhere classified, thoracic region, initial encounter for fracture: Secondary | ICD-10-CM | POA: Diagnosis not present

## 2021-06-08 DIAGNOSIS — S299XXA Unspecified injury of thorax, initial encounter: Secondary | ICD-10-CM | POA: Diagnosis not present

## 2021-06-08 DIAGNOSIS — R0789 Other chest pain: Secondary | ICD-10-CM

## 2021-06-08 DIAGNOSIS — M546 Pain in thoracic spine: Secondary | ICD-10-CM

## 2021-06-08 DIAGNOSIS — R64 Cachexia: Secondary | ICD-10-CM

## 2021-06-08 DIAGNOSIS — L309 Dermatitis, unspecified: Secondary | ICD-10-CM

## 2021-06-08 MED ORDER — TRIAMCINOLONE ACETONIDE 0.1 % EX CREA: 1.0000 "application " | TOPICAL_CREAM | Freq: Two times a day (BID) | CUTANEOUS | 0 refills | Status: AC

## 2021-06-08 NOTE — Patient Instructions (Addendum)
Eczema-Use Aveeno, Aquaphor Aveeno. Use the cream I send in as needed.  There are no over the counter pills that help with brain health. If you notice a decline after stopping, please resume it.   We will be in touch regarding your X-ray results.  If you do not hear anything about your referral in the next 1-2 weeks, call our office and ask for an update.  Heat (pad or rice pillow in microwave) over affected area, 10-15 minutes twice daily.   Ice/cold pack over area for 10-15 min twice daily.  Let us know if you need anything.

## 2021-06-08 NOTE — Progress Notes (Signed)
Chief Complaint  Patient presents with   Back Pain    Rib pain     Samantha Gregory is a 86 y.o. female here for a skin complaint.  Duration: several months Location: legs Pruritic? Yes Painful? No Drainage? No New soaps/lotions/topicals/detergents? No Sick contacts? No Other associated symptoms: scaling Therapies tried thus far: Alcohol, OTC cream that she cannot remember the name of  Pt had an MRI a few weeks ago. She was jostled around as she had to re-enter the device several times. After that, she has had mid-thoracic back pain. No bruising or redness. Worse on the R. Was told by NS that she is not a candidate for procedures and to avoid lifting things. She has not fully been compliant with that rec as she does live alone. No new neuro s/s's. Tylenol helps.   She was picking something up a week ago and felt a pain in her R upper lateral chest area. No bruising, redness, swelling, decreased ROM or neuro s/s's.   Pt's appetite is not what it it used to be. She drinks protein shakes twice daily (1/2 in morning and evening). She feels her belly is protruding.   Past Medical History:  Diagnosis Date   Abnormal cervical Papanicolaou smear 06/03/2017   Acute right ankle pain 04/06/2020   Bilateral carotid artery stenosis 09/30/2019   Cachexia ([place]) 05/22/2020   Chemical induced allergic contact dermatitis 05/28/2018   Closed compression fracture of L5 lumbar vertebra 06/03/2017   Pain control with scheduled Tylenol, prn Tramadol. Calcitonin x 2 months. PT. If not with adequate improvement in pain, to IR.    Compression fracture of body of thoracic vertebra ([place]) 11/28/2018   De Quervain's tenosynovitis, left 06/14/2018   Dry skin dermatitis 11/19/2018   Essential hypertension    Heart palpitations    Hypercholesterolemia    Left hip pain 09/30/2019   Low hemoglobin 12/11/2014   Patient was found at her gynecologic exam with Dr. Ulanda Edison to have a mildly low hemoglobin.  We will plan to  check it next time along with an anemia panel.  In the meantime she will add a multivitamin with iron once a day    Mitral regurgitation    Mitral valve insufficiency 07/09/2019   Moderate aortic insufficiency 09/02/2015   Echo 9/16: Normal LV function; mild LAE; trileaflet aortic valve with moderate (3+) central AI; mild to moderate MR; moderate TR.    Moderate protein-calorie malnutrition ([place]) 04/06/2020   Orthostatic hypotension    Osteoporosis 06/20/2011   Paroxysmal atrial tachycardia (HCC)    PVC's (premature ventricular contractions)    Squamous cell carcinoma, leg, right    Squamous cell carcinoma, leg, right    Stenosis of cervix 06/03/2017   Tachycardia 06/03/2017    BP 120/78   Pulse 78   Temp 97.9 F (36.6 C) (Oral)   Ht 5\' 5"  (1.651 m)   Wt 78 lb 4 oz (35.5 kg)   SpO2 96%   BMI 13.02 kg/m  Gen: awake, alert, appearing stated age Lungs: No accessory muscle use MSK: Kyphosis noted.  TTP over R thor parasp msc; no midline bony tenderness to palpation. Neuro: DTRs equal and symmetric in the upper extremities, no clonus, no cerebellar signs, gait is largely normal. Skin: anterior R ankle there is a circular patch of scaly skin measuring approx 3.5 cm in diameter. No drainage, erythema, TTP, fluctuance, excoriation Psych: Age appropriate judgment and insight  Eczema, unspecified type - Plan: triamcinolone cream (KENALOG) 0.1 %  Right-sided chest wall pain - Plan: DG Ribs Unilateral Right  Cachexia ([place]) - Plan: Ambulatory referral to Physical Therapy, AMB Referral to [facility] Coordinaton  Right-sided thoracic back pain, unspecified chronicity - Plan: Ambulatory referral to Physical Therapy  Aquaphor, Eucerin, or Aveeno recommended for daily usage.  Kenalog sent in for as needed use.  Avoid scented products.  Try not to itch. There is mild tenderness to palpation on exam, will check x-ray given her cachectic state. She needs to increase her caloric intake.  I think she  is starting to display kwashiorkor which would explain her protruding abdomen. Refer to physical therapy.  I do not want her doing stretches and exercises on her own as I think she may be more prone to exacerbate symptoms going too hard. F/u prn. The patient voiced understanding and agreement to the plan.  Greater than 40 minutes were spent with the patient discussing the above in addition to reviewing their chart information on the same day of the visit.   [place], DO 06/08/21 12:45 PM

## 2021-06-15 ENCOUNTER — Ambulatory Visit: Payer: PPO | Attending: Family Medicine | Admitting: Physical Therapy

## 2021-06-17 ENCOUNTER — Telehealth: Payer: Self-pay | Admitting: *Deleted

## 2021-06-17 NOTE — Chronic Care Management (AMB) (Signed)
  Chronic Care Management   Note  06/17/2021 Name: Samantha Gregory MRN: [id number] DOB: 08-18-1935  Samantha Gregory is a 86 y.o. year old female who is a primary care patient of [name], DO. I reached out to [employer] by phone today in response to a referral sent by Ms. Katrinia Barnett [place] PCP, Crosby Oyster, DO     Ms. Marlatt was given information about Chronic Care Management services today including:  CCM service includes personalized support from designated clinical staff supervised by her physician, including individualized plan of care and coordination with other care providers 24/7 contact phone numbers for assistance for urgent and routine care needs. Service will only be billed when office clinical staff spend 20 minutes or more in a month to coordinate care. Only one practitioner may furnish and bill the service in a calendar month. The patient may stop CCM services at any time (effective at the end of the month) by phone call to the office staff. The patient will be responsible for cost sharing (co-pay) of up to 20% of the service fee (after annual deductible is met).  Patient did not agree to enrollment in care management services and does not wish to consider at this time.  Follow up plan: Patient declines further follow up and engagement by the care management team. Appropriate care team members and provider have been notified via electronic communication.  The care management team is available to follow up with the patient after provider conversation with the patient regarding recommendation for care management engagement and subsequent re-referral to the care management team.   Julian Hy, [facility]  Direct Dial: [phone]

## 2021-07-06 ENCOUNTER — Other Ambulatory Visit: Payer: Self-pay

## 2021-07-06 ENCOUNTER — Ambulatory Visit (INDEPENDENT_AMBULATORY_CARE_PROVIDER_SITE_OTHER): Payer: PPO | Admitting: Family Medicine

## 2021-07-06 ENCOUNTER — Encounter: Payer: Self-pay | Admitting: Family Medicine

## 2021-07-06 VITALS — BP 130/80 | HR 65 | Temp 97.6°F | Ht 65.0 in | Wt 80.4 lb

## 2021-07-06 DIAGNOSIS — D692 Other nonthrombocytopenic purpura: Secondary | ICD-10-CM | POA: Diagnosis not present

## 2021-07-06 DIAGNOSIS — N3001 Acute cystitis with hematuria: Secondary | ICD-10-CM

## 2021-07-06 DIAGNOSIS — M545 Low back pain, unspecified: Secondary | ICD-10-CM

## 2021-07-06 DIAGNOSIS — G8929 Other chronic pain: Secondary | ICD-10-CM

## 2021-07-06 DIAGNOSIS — E40 Kwashiorkor: Secondary | ICD-10-CM | POA: Diagnosis not present

## 2021-07-06 LAB — POC URINALSYSI DIPSTICK (AUTOMATED)
Bilirubin, UA: NEGATIVE
Glucose, UA: NEGATIVE
Ketones, UA: NEGATIVE
Leukocytes, UA: NEGATIVE
Nitrite, UA: NEGATIVE
Protein, UA: POSITIVE — AB
Spec Grav, UA: >=1.03 — AB (ref 1.010–1.025)
Urobilinogen, UA: 0.2 E.U./dL
pH, UA: 5.5 (ref 5.0–8.0)

## 2021-07-06 MED ORDER — CEPHALEXIN 500 MG PO CAPS: 500.0000 mg | ORAL_CAPSULE | Freq: Three times a day (TID) | ORAL | 0 refills | Status: AC

## 2021-07-06 NOTE — Patient Instructions (Addendum)
Stay hydrated.   Warning signs/symptoms: Uncontrollable nausea/vomiting, fevers, worsening symptoms despite treatment, confusion.  Give [place] around 2 business days to get culture back to you.  Ice/cold pack over area for 10-15 min twice daily.  Heat (pad or rice pillow in microwave) over affected area, 10-15 minutes twice daily.   If you do not hear anything about your referral in the next 1-2 weeks, call our office and ask for an update.  Let us know if you need anything.

## 2021-07-06 NOTE — Progress Notes (Signed)
Chief Complaint  Patient presents with   Dysuria   Urinary Frequency    [place] Samantha Gregory is a 86 y.o. female here for possible UTI.  Duration: 2 days. Symptoms: Dysuria, urinary frequency, hematuria, urinary incontinence, and urgency Denies: urinary hesitancy, urinary retention, fever, nausea, vomiting, new flank pain, vaginal discharge Hx of recurrent UTI? No Denies new sexual partners. Took 500 mg amoxicillin x 2, 2 days ago.   Chronic b/l low back pain, worse on R. No recent inj or change in activity. She does have a hx of old compression fractures. Denies bruising, swelling or redness. No fevers.   Past Medical History:  Diagnosis Date   Abnormal cervical Papanicolaou smear 06/03/2017   Acute right ankle pain 04/06/2020   Bilateral carotid artery stenosis 09/30/2019   Cachexia ([place]) 05/22/2020   Chemical induced allergic contact dermatitis 05/28/2018   Closed compression fracture of L5 lumbar vertebra 06/03/2017   Pain control with scheduled Tylenol, prn Tramadol. Calcitonin x 2 months. PT. If not with adequate improvement in pain, to IR.    Compression fracture of body of thoracic vertebra ([place]) 11/28/2018   De Quervain's tenosynovitis, left 06/14/2018   Dry skin dermatitis 11/19/2018   Essential hypertension    Heart palpitations    Hypercholesterolemia    Left hip pain 09/30/2019   Low hemoglobin 12/11/2014   Patient was found at her gynecologic exam with Dr. Ulanda Edison to have a mildly low hemoglobin.  We will plan to check it next time along with an anemia panel.  In the meantime she will add a multivitamin with iron once a day    Mitral regurgitation    Mitral valve insufficiency 07/09/2019   Moderate aortic insufficiency 09/02/2015   Echo 9/16: Normal LV function; mild LAE; trileaflet aortic valve with moderate (3+) central AI; mild to moderate MR; moderate TR.    Moderate protein-calorie malnutrition ([place]) 04/06/2020   Orthostatic hypotension    Osteoporosis 06/20/2011    Paroxysmal atrial tachycardia (HCC)    PVC's (premature ventricular contractions)    Squamous cell carcinoma, leg, right    Squamous cell carcinoma, leg, right    Stenosis of cervix 06/03/2017   Tachycardia 06/03/2017     BP 130/80   Pulse 65   Temp 97.6 F (36.4 C) (Oral)   Ht 5\' 5"  (1.651 m)   Wt 80 lb 6 oz (36.5 kg)   SpO2 98%   BMI 13.38 kg/m  General: Awake, alert, appears stated age Heart: RRR Lungs: CTAB, normal respiratory effort, no accessory muscle usage Abd: BS+, soft, NT, ND, no masses or organomegaly MSK: + b/lCVA tenderness, neg Lloyd's sign Neuro: Gait is slow, no cerebellar signs Psych: Age appropriate judgment and insight  Acute cystitis with hematuria - Plan: cephALEXin (KEFLEX) 500 MG capsule  Chronic bilateral low back pain without sciatica - Plan: Ambulatory referral to Physical Therapy  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 Ck Cx. Stay hydrated. Seek immediate care if pt starts to develop fevers, new/worsening symptoms, uncontrollable N/V. Chronic, unstable. Start PT. Declines HH. Heat, ice, Tylenol. Increase PO intake. She is working on this.  Reassurance. F/u prn. The patient voiced understanding and agreement to the plan.  [place], DO 07/06/21 11:21 AM

## 2021-07-07 LAB — URINE CULTURE
MICRO NUMBER:: 12108828
Result:: NO GROWTH
SPECIMEN QUALITY:: ADEQUATE

## 2021-07-12 ENCOUNTER — Ambulatory Visit: Payer: PPO | Admitting: Physical Therapy

## 2021-08-09 ENCOUNTER — Ambulatory Visit (INDEPENDENT_AMBULATORY_CARE_PROVIDER_SITE_OTHER): Payer: PPO | Admitting: Family Medicine

## 2021-08-09 ENCOUNTER — Encounter: Payer: Self-pay | Admitting: Family Medicine

## 2021-08-09 ENCOUNTER — Other Ambulatory Visit: Payer: Self-pay

## 2021-08-09 VITALS — BP 138/82 | HR 65 | Temp 97.9°F | Ht 63.0 in | Wt 80.2 lb

## 2021-08-09 DIAGNOSIS — D692 Other nonthrombocytopenic purpura: Secondary | ICD-10-CM

## 2021-08-09 DIAGNOSIS — D0471 Carcinoma in situ of skin of right lower limb, including hip: Secondary | ICD-10-CM | POA: Diagnosis not present

## 2021-08-09 DIAGNOSIS — L299 Pruritus, unspecified: Secondary | ICD-10-CM

## 2021-08-09 DIAGNOSIS — K5900 Constipation, unspecified: Secondary | ICD-10-CM | POA: Diagnosis not present

## 2021-08-09 DIAGNOSIS — C44722 Squamous cell carcinoma of skin of right lower limb, including hip: Secondary | ICD-10-CM | POA: Diagnosis not present

## 2021-08-09 DIAGNOSIS — M7989 Other specified soft tissue disorders: Secondary | ICD-10-CM

## 2021-08-09 DIAGNOSIS — D489 Neoplasm of uncertain behavior, unspecified: Secondary | ICD-10-CM

## 2021-08-09 DIAGNOSIS — R631 Polydipsia: Secondary | ICD-10-CM

## 2021-08-09 LAB — COMPREHENSIVE METABOLIC PANEL
ALT: 39 U/L — ABNORMAL HIGH (ref 0–35)
AST: 44 U/L — ABNORMAL HIGH (ref 0–37)
Albumin: 4.2 g/dL (ref 3.5–5.2)
Alkaline Phosphatase: 85 U/L (ref 39–117)
BUN: 23 mg/dL (ref 6–23)
CO2: 33 mEq/L — ABNORMAL HIGH (ref 19–32)
Calcium: 9.7 mg/dL (ref 8.4–10.5)
Chloride: 100 mEq/L (ref 96–112)
Creatinine, Ser: 0.86 mg/dL (ref 0.40–1.20)
GFR: 62.38 mL/min (ref >60.00)
Glucose, Bld: 71 mg/dL (ref 70–99)
Potassium: 3.8 mEq/L (ref 3.5–5.1)
Sodium: 141 mEq/L (ref 135–145)
Total Bilirubin: 0.9 mg/dL (ref 0.2–1.2)
Total Protein: 6.6 g/dL (ref 6.0–8.3)

## 2021-08-09 LAB — HEMOGLOBIN A1C: Hgb A1c MFr Bld: 5.4 % (ref 4.6–6.5)

## 2021-08-09 LAB — TSH: TSH: 7.4 u[IU]/mL — ABNORMAL HIGH (ref 0.35–5.50)

## 2021-08-09 NOTE — Progress Notes (Signed)
Chief Complaint  Patient presents with   Follow-up    2 month    Samantha Gregory here for bilateral leg swelling.  Duration: 2 months Hx of prolonged bedrest, recent surgery, travel or injury? No Pain the calf? No SOB? No Personal or family history of clot or bleeding disorder? No Hx of heart failure, renal failure, hepatic failure? No Ankles swelling most at the end of the day.   Pt drinking a lot of fluids and has trouble satiating her thirst. DM runs in her family and she is concerned with that. Does not know how much she drinks per day.   Urinates frequently at night, every 2 hrs it is disrupting her sleep. She is not having incomplete emptying that she is aware of. She does have constipation.   She is concerned for memory loss and wondering if Prevagen or anything will help. She is a healthy eater and stays active. No known hx of dementia in family.   Itching over upper body. Uses Kenalog prn and lotion. Wondering if there is a pill she can use. No new topicals. No sick contacts or pain.   Past Medical History:  Diagnosis Date   Abnormal cervical Papanicolaou smear 06/03/2017   Acute right ankle pain 04/06/2020   Bilateral carotid artery stenosis 09/30/2019   Cachexia ([place]) 05/22/2020   Chemical induced allergic contact dermatitis 05/28/2018   Closed compression fracture of L5 lumbar vertebra 06/03/2017   Pain control with scheduled Tylenol, prn Tramadol. Calcitonin x 2 months. PT. If not with adequate improvement in pain, to IR.    Compression fracture of body of thoracic vertebra ([place]) 11/28/2018   De Quervain's tenosynovitis, left 06/14/2018   Dry skin dermatitis 11/19/2018   Essential hypertension    Heart palpitations    Hypercholesterolemia    Left hip pain 09/30/2019   Low hemoglobin 12/11/2014   Patient was found at her gynecologic exam with Dr. Ulanda Edison to have a mildly low hemoglobin.  We will plan to check it next time along with an anemia panel.  In the meantime she  will add a multivitamin with iron once a day    Mitral regurgitation    Mitral valve insufficiency 07/09/2019   Moderate aortic insufficiency 09/02/2015   Echo 9/16: Normal LV function; mild LAE; trileaflet aortic valve with moderate (3+) central AI; mild to moderate MR; moderate TR.    Moderate protein-calorie malnutrition ([place]) 04/06/2020   Orthostatic hypotension    Osteoporosis 06/20/2011   Paroxysmal atrial tachycardia (HCC)    PVC's (premature ventricular contractions)    Squamous cell carcinoma, leg, right    Squamous cell carcinoma, leg, right    Stenosis of cervix 06/03/2017   Tachycardia 06/03/2017   Family History  Problem Relation Age of Onset   Heart attack Father    Stroke Mother    Kidney disease Mother    Heart disease Brother    Hypertension Brother    Heart disease Sister    Heart disease Sister    Stroke Paternal Uncle    Stroke Paternal Aunt    Past Surgical History:  Procedure Laterality Date   APPENDECTOMY     TEE WITHOUT CARDIOVERSION N/A 09/08/2015   Procedure: TRANSESOPHAGEAL ECHOCARDIOGRAM (TEE);  Surgeon: Lelon Perla, MD;  Location: [facility] END[facility]COPY;  Service: Cardiovascular;  Laterality: N/A;    Current Outpatient Medications:    triamcinolone cream (KENALOG) 0.1 %, Apply 1 application topically 2 (two) times daily., Disp: 30 g, Rfl: 0  BP 138/82  Pulse 65   Temp 97.9 F (36.6 C) (Oral)   Ht '5\' 3"'$  (1.6 m)   Wt 80 lb 4 oz (36.4 kg)   SpO2 94%   BMI 14.22 kg/m  Gen- awake, alert, appears stated age Heart- RRR, currently no LE edema Lungs- CTAB, normal effort w/o accessory muscle use MSK- no calf pain b/l Skin- various areas of ecchymosis throughout exposed skin; there is a 1.3 cm in diameter raised lesion with central excoriation on the anterior and prox RLE. There is a lesion just lower than is raised and slightly pinkish in color measuring 0.7 cm in diameter. No erythema, Ttp, scaling, drainage, fluctuance or induration Psych: Age appropriate  judgment and insight  Procedure note; shave biopsy Informed consent was obtained. The initial area measuring 1.3 cm in diameter was cleaned with alcohol and injected with 1.5 mL of 1% lidocaine with epinephrine. Dermablade was slightly bent and used to cut under the area of interest. The specimen was placed in a sterile specimen cup and sent to the lab. The area was then cauterized ensuring adequate hemostasis. The area was dressed with triple antibiotic ointment and a bandage. This process was repeated on the lower lesion measuring 0.7 cm in diameter.  There were no complications noted. The patient tolerated the procedure well.  Polydipsia - Plan: Hemoglobin A1c  Localized swelling of both lower extremities - Plan: TSH, Comprehensive metabolic panel, CANCELED: Lipid panel  Senile purpura (HCC)  Constipation, unspecified constipation type  Neoplasm of uncertain behavior - Plan: PR SHAV SKIN LES 0.6-1.0 CM TRUNK,ARM,LEG, PR SHAV SKIN LES 1.1-2.0 CM TRUNK,ARM,LEG, Surgical pathology( [place]/ [facility]), Surgical pathology( [place]/ [facility]), CANCELED: Surgical pathology( / [facility])  Pruritic dermatitis  Ck labs. Stay hydrated. Ck labs. Elevate legs, mind salt intake. Counseled on this. Hydration as above. Consider daily fiber supplement like Metamucil or Benefiber. Could add MiraLAX if still severe. Shave biopsy x 2. She has a hx of SCC, I think the larger lesion is this. C&E if this is the case. Aftercare instructions verbalized and written down. Warning signs and symptoms verbalized and written down in AVS.  Avoid scented products. Try not to itch. Emollient daily. Kenalog prn. Consider PO antihist.  F/u prn. Pt voiced understanding and agreement to the plan.  I spent 50 min w the patient outside of procedural time discussing the above issues and reviewing her chart on the same day of visit.   [place], DO 08/09/21  12:04 PM

## 2021-08-09 NOTE — Patient Instructions (Addendum)
Follow up with your dermatologist for your legs please.   For the swelling in your lower extremities, be sure to elevate your legs when able, mind the salt intake, stay physically active and consider wearing compression stockings.  Give [place] 2-3 business days to get the results of your labs back.   OK to take Metamucil or Benefiber daily. Consider MiraLAX daily as needed in addition to this.   No water within 1 hour of bedtime. Consider voiding twice to ensure complete emptying before bed.   Take 1200 mg of calcium daily and at least 1000 units of vitamin D3 daily.   Keep the diet clean and stay active. There is nothing other than this that helps prevent dementia.   Claritin (loratadine), Allegra (fexofenadine), Zyrtec (cetirizine) which is also equivalent to Xyzal (levocetirizine); these are listed in order from weakest to strongest. Generic, and therefore cheaper, options are in the parentheses.   There are available OTC, and the generic versions, which may be cheaper, are in parentheses. Show this to a pharmacist if you have trouble finding any of these items.  Do not shower for the rest of the day. When you do wash it, use only soap and water. Do not vigorously scrub. Apply triple antibiotic ointment (like Neosporin) twice daily. Keep the area clean and dry.   Things to look out for: increasing pain not relieved by ibuprofen/acetaminophen, fevers, spreading redness, drainage of pus, or foul odor.  Let us know if you need anything.

## 2021-08-10 ENCOUNTER — Other Ambulatory Visit: Payer: Self-pay | Admitting: Family Medicine

## 2021-08-10 ENCOUNTER — Other Ambulatory Visit (INDEPENDENT_AMBULATORY_CARE_PROVIDER_SITE_OTHER): Payer: PPO

## 2021-08-10 DIAGNOSIS — R7989 Other specified abnormal findings of blood chemistry: Secondary | ICD-10-CM

## 2021-08-10 LAB — T4, FREE: Free T4: 0.8 ng/dL (ref 0.60–1.60)

## 2021-08-16 ENCOUNTER — Other Ambulatory Visit: Payer: Self-pay | Admitting: Family Medicine

## 2021-08-16 DIAGNOSIS — R7989 Other specified abnormal findings of blood chemistry: Secondary | ICD-10-CM

## 2021-08-17 ENCOUNTER — Other Ambulatory Visit: Payer: PPO

## 2021-08-18 ENCOUNTER — Other Ambulatory Visit: Payer: Self-pay

## 2021-08-18 ENCOUNTER — Encounter: Payer: Self-pay | Admitting: Family Medicine

## 2021-08-18 ENCOUNTER — Ambulatory Visit (INDEPENDENT_AMBULATORY_CARE_PROVIDER_SITE_OTHER): Payer: PPO | Admitting: Family Medicine

## 2021-08-18 DIAGNOSIS — C44722 Squamous cell carcinoma of skin of right lower limb, including hip: Secondary | ICD-10-CM

## 2021-08-18 NOTE — Progress Notes (Signed)
No chief complaint on file.   Subjective: Patient is a 86 y.o. female here for follow up.  Around 1 week ago, the patient had a skin biopsy done of 2 lesions on her lower extremity on the right.  They both came back squamous cell carcinomas.  She has had a history of this before in similar regions on her anterior lower extremities.  The areas have been healing well otherwise.  Past Medical History:  Diagnosis Date   Abnormal cervical Papanicolaou smear 06/03/2017   Acute right ankle pain 04/06/2020   Bilateral carotid artery stenosis 09/30/2019   Cachexia ([place]) 05/22/2020   Chemical induced allergic contact dermatitis 05/28/2018   Closed compression fracture of L5 lumbar vertebra 06/03/2017   Pain control with scheduled Tylenol, prn Tramadol. Calcitonin x 2 months. PT. If not with adequate improvement in pain, to IR.    Compression fracture of body of thoracic vertebra ([place]) 11/28/2018   De Quervain's tenosynovitis, left 06/14/2018   Dry skin dermatitis 11/19/2018   Essential hypertension    Heart palpitations    Hypercholesterolemia    Left hip pain 09/30/2019   Low hemoglobin 12/11/2014   Patient was found at her gynecologic exam with Dr. Ulanda Edison to have a mildly low hemoglobin.  We will plan to check it next time along with an anemia panel.  In the meantime she will add a multivitamin with iron once a day    Mitral regurgitation    Mitral valve insufficiency 07/09/2019   Moderate aortic insufficiency 09/02/2015   Echo 9/16: Normal LV function; mild LAE; trileaflet aortic valve with moderate (3+) central AI; mild to moderate MR; moderate TR.    Moderate protein-calorie malnutrition ([place]) 04/06/2020   Orthostatic hypotension    Osteoporosis 06/20/2011   Paroxysmal atrial tachycardia (HCC)    PVC's (premature ventricular contractions)    Squamous cell carcinoma, leg, right    Squamous cell carcinoma, leg, right    Stenosis of cervix 06/03/2017   Tachycardia 06/03/2017    Objective: General:  Awake, appears stated age Skin: Currently 2 circular lesions on the anterior right lower extremity measuring around 1.2 cm in diameter and 0.7 cm in diameter respectively.  No surrounding erythema or pigment changes, fluctuance, drainage, or ttp Lungs: No accessory muscle use Psych: Age appropriate judgment and insight, normal affect and mood  Procedure note: Electrodesiccation and curettage Informed consent obtained. The initial 1.2 cm diameter lesion of interest was cleaned with alcohol and anesthetized with 3 mL of 1% lidocaine with epinephrine. After adequate anesthesia was obtained, the area was electrodesiccated with a Hyfrecator and the charred tissue was scraped away with a curette. This process was repeated 2 more times for a total of 3 rounds. The cancerous tissue was easily noted by texture under the curette compared to the healthy surrounding tissue After the final round, the area was cauterized to ensure adequate hemostasis. Triple antibiotic ointment and a bandage were placed. This process was repeated on the lower 0.7 cm lesion using 2 mL of 1% lidocaine with epinephrine. There were no immediate complications noted. The patient tolerated the procedure well.  Assessment and Plan: SCC (squamous cell carcinoma), leg, right - Plan: PR DESTR MALIG TRUNK,EXTREM 1.1-2 CM  Squamous cell carcinoma of skin of right lower extremity - Plan: PR DESTR MALIG TRUNK,EXTREM 0.6-1 CM  Aftercare instructions verbalized and written down. Warning signs and symptoms verbalized and written down in AVS. Warned about low risk of recurrence. We will monitor routinely.  Fu as originally  scheduled.  The patient voiced understanding and agreement to the plan.  [place], DO 08/18/21  12:06 PM

## 2021-08-18 NOTE — Patient Instructions (Signed)
Do not shower for the rest of the day. When you do wash it, use only soap and water. Do not vigorously scrub. Apply triple antibiotic ointment (like Neosporin) twice daily. Keep the area clean and dry.   Things to look out for: increasing pain not relieved by ibuprofen/acetaminophen, fevers, spreading redness, drainage of pus, or foul odor.  We will keep an eye on these should they come back.   Let us know if you need anything.

## 2021-08-23 ENCOUNTER — Ambulatory Visit: Payer: PPO | Admitting: Family Medicine

## 2021-08-23 ENCOUNTER — Other Ambulatory Visit: Payer: Self-pay

## 2021-08-23 ENCOUNTER — Other Ambulatory Visit (INDEPENDENT_AMBULATORY_CARE_PROVIDER_SITE_OTHER): Payer: PPO

## 2021-08-23 DIAGNOSIS — R7989 Other specified abnormal findings of blood chemistry: Secondary | ICD-10-CM | POA: Diagnosis not present

## 2021-08-23 LAB — HEPATIC FUNCTION PANEL
ALT: 29 U/L (ref 0–35)
AST: 37 U/L (ref 0–37)
Albumin: 3.9 g/dL (ref 3.5–5.2)
Alkaline Phosphatase: 80 U/L (ref 39–117)
Bilirubin, Direct: 0.1 mg/dL (ref 0.0–0.3)
Total Bilirubin: 0.8 mg/dL (ref 0.2–1.2)
Total Protein: 6.4 g/dL (ref 6.0–8.3)

## 2021-08-24 ENCOUNTER — Other Ambulatory Visit: Payer: Self-pay | Admitting: Family Medicine

## 2021-08-24 ENCOUNTER — Ambulatory Visit (INDEPENDENT_AMBULATORY_CARE_PROVIDER_SITE_OTHER): Payer: PPO | Admitting: Family Medicine

## 2021-08-24 ENCOUNTER — Encounter: Payer: Self-pay | Admitting: Family Medicine

## 2021-08-24 VITALS — BP 136/80 | HR 97 | Temp 98.1°F | Ht 64.0 in | Wt 82.0 lb

## 2021-08-24 DIAGNOSIS — L570 Actinic keratosis: Secondary | ICD-10-CM | POA: Diagnosis not present

## 2021-08-24 DIAGNOSIS — C44722 Squamous cell carcinoma of skin of right lower limb, including hip: Secondary | ICD-10-CM

## 2021-08-24 MED ORDER — IMIQUIMOD 2.5 % EX CREA: TOPICAL_CREAM | CUTANEOUS | 0 refills | Status: DC

## 2021-08-24 NOTE — Telephone Encounter (Signed)
Called the patient no answer/mailbox full. 

## 2021-08-24 NOTE — Telephone Encounter (Signed)
Ok to fill alternative

## 2021-08-24 NOTE — Patient Instructions (Addendum)
Continue using a band-aid and the triple antibiotic ointment twice daily until the lesions fully scab over.   These look good, no signs of infection.   No more alcohol over the top of this.   Use the cream nightly for 2 weeks over the lip.   Things to look out for: increasing pain not relieved by ibuprofen/acetaminophen, fevers, spreading redness, drainage of pus, or foul odor.

## 2021-08-24 NOTE — Progress Notes (Signed)
Chief Complaint  Patient presents with   Follow-up    procedure    Subjective: Patient is a 86 y.o. female here for f/u procedure.  8/15: shave biopsy showed 2 areas of SCC on RLE.  8/24: E&C done on both lesions on RLE.   Today: Reports she has been putting triple antibiotic ointment twice daily with dressing changes.  She is using Band-Aids and cleaning with alcohol. Denies pain, fevers, purulent drainage, foul odor, or spreading redness.  She also mentions a lesion on her upper lip that we will scale but never quite go away.  She has a history of skin cancer on her nose requiring Mohs surgery.  It has not changed since she noticed it.  She has not tried anything for it so far.  Past Medical History:  Diagnosis Date   Abnormal cervical Papanicolaou smear 06/03/2017   Acute right ankle pain 04/06/2020   Bilateral carotid artery stenosis 09/30/2019   Cachexia ([place]) 05/22/2020   Chemical induced allergic contact dermatitis 05/28/2018   Closed compression fracture of L5 lumbar vertebra 06/03/2017   Pain control with scheduled Tylenol, prn Tramadol. Calcitonin x 2 months. PT. If not with adequate improvement in pain, to IR.    Compression fracture of body of thoracic vertebra ([place]) 11/28/2018   De Quervain's tenosynovitis, left 06/14/2018   Dry skin dermatitis 11/19/2018   Essential hypertension    Heart palpitations    Hypercholesterolemia    Left hip pain 09/30/2019   Low hemoglobin 12/11/2014   Patient was found at her gynecologic exam with Dr. Ulanda Edison to have a mildly low hemoglobin.  We will plan to check it next time along with an anemia panel.  In the meantime she will add a multivitamin with iron once a day    Mitral regurgitation    Mitral valve insufficiency 07/09/2019   Moderate aortic insufficiency 09/02/2015   Echo 9/16: Normal LV function; mild LAE; trileaflet aortic valve with moderate (3+) central AI; mild to moderate MR; moderate TR.    Moderate protein-calorie malnutrition ([place])  04/06/2020   Orthostatic hypotension    Osteoporosis 06/20/2011   Paroxysmal atrial tachycardia (HCC)    PVC's (premature ventricular contractions)    Squamous cell carcinoma, leg, right    Squamous cell carcinoma, leg, right    Stenosis of cervix 06/03/2017   Tachycardia 06/03/2017    Objective: BP 136/80   Pulse 97   Temp 98.1 F (36.7 C) (Oral)   Ht '5\' 4"'$  (1.626 m)   Wt 82 lb (37.2 kg)   SpO2 98%   BMI 14.08 kg/m  General: Awake, appears stated age Skin: There is a small scaly macule just on the right upper lip.  There is no erythema, fluctuance, drainage, or openings. Over the anterior right lower extremity, there is a circular lesion with surrounding pink skin and central excoriation with a serous drainage noted.  There is no surrounding warmth that is excessive.  Just inferior is a smaller circular lesion with excoriation.  I do not appreciate any erythema, drainage, fluctuance. Lungs: No accessory muscle use Psych: Age appropriate judgment and insight, normal affect and mood  Assessment and Plan: Squamous cell carcinoma of skin of right lower extremity  AK (actinic keratosis) - Plan: Imiquimod 2.5 % CREA  This appears to be healing well.  Continue triple antibiotic ointment twice daily and keeping the areas clean and dry.  Stop using alcohol.  Okay to shower and use soap/water, do not vigorously scrub or use a  loofah/washcloth over it. Warning signs and symptoms verbalized and written down in AVS.  2 weeks of nightly cream.  If no improvement, will consider biopsy versus having her follow-up with her dermatologist. The patient voiced understanding and agreement to the plan.  I spent 33 minutes with the patient reviewing the above plan and in reviewing her chart on the same day of the visit.  [place], DO 08/24/21  11:54 AM

## 2021-08-25 MED ORDER — IMIQUIMOD 2.5 % EX CREA: TOPICAL_CREAM | CUTANEOUS | 0 refills | Status: DC

## 2021-08-25 NOTE — Telephone Encounter (Signed)
Called the patient no answer/mailbox was full.

## 2021-08-25 NOTE — Telephone Encounter (Signed)
Called informed the patient of PCP instructions. She verbalized understanding. She did state she will never take them again. She was instructed to stay well hydrated with water and to try to eat something. She did say she had a good breakfast and lunch. She is concerned she has had to go to the bathroom so often.  She has gone through 5 pairs of under pants today.  If this continues is there anything she can do/take to slow it down

## 2021-08-25 NOTE — Telephone Encounter (Signed)
Called the patient no answer/mailbox full. 

## 2021-08-25 NOTE — Telephone Encounter (Signed)
Labs are back to normal. Generic sent to [facility]. Stick with fiber (Benefiber or Metamucil) daily instead of stimulants at this point.

## 2021-08-25 NOTE — Telephone Encounter (Signed)
Called the patient and she would like for you to determine another pharmacy to send it to. She states that [facility] is very expensive. 1--She has a Neurosurgeon on her list. 2--ALSO, she would like to know her lab results from Monday 08/23/21. 3--she has been constipated and took 3 ducolax and is now not able to get off the toilet due to going all the time.

## 2021-08-31 ENCOUNTER — Telehealth: Payer: Self-pay | Admitting: Family Medicine

## 2021-08-31 NOTE — Telephone Encounter (Signed)
Left message for patient to call back and schedule Medicare Annual Wellness Visit (AWV) in office.  ° °If not able to come in office, please offer to do virtually or by telephone.  Left office number and my jabber #[phone]. ° °Due for AWVI ° °Please schedule at anytime with Nurse Health Advisor. °  °

## 2021-10-27 ENCOUNTER — Ambulatory Visit (INDEPENDENT_AMBULATORY_CARE_PROVIDER_SITE_OTHER): Payer: PPO | Admitting: Family Medicine

## 2021-10-27 ENCOUNTER — Ambulatory Visit (HOSPITAL_BASED_OUTPATIENT_CLINIC_OR_DEPARTMENT_OTHER)
Admission: RE | Admit: 2021-10-27 | Discharge: 2021-10-27 | Disposition: A | Discharge status: Home / Self Care | Payer: PPO | Source: Ambulatory Visit | Attending: Family Medicine | Admitting: Family Medicine

## 2021-10-27 ENCOUNTER — Encounter: Payer: Self-pay | Admitting: Family Medicine

## 2021-10-27 ENCOUNTER — Other Ambulatory Visit: Payer: Self-pay

## 2021-10-27 VITALS — BP 122/76 | HR 62 | Temp 97.5°F | Ht 64.0 in | Wt 80.5 lb

## 2021-10-27 DIAGNOSIS — R682 Dry mouth, unspecified: Secondary | ICD-10-CM | POA: Diagnosis not present

## 2021-10-27 DIAGNOSIS — Z23 Encounter for immunization: Secondary | ICD-10-CM

## 2021-10-27 DIAGNOSIS — H029 Unspecified disorder of eyelid: Secondary | ICD-10-CM | POA: Diagnosis not present

## 2021-10-27 DIAGNOSIS — M25552 Pain in left hip: Secondary | ICD-10-CM | POA: Insufficient documentation

## 2021-10-27 DIAGNOSIS — L989 Disorder of the skin and subcutaneous tissue, unspecified: Secondary | ICD-10-CM

## 2021-10-27 DIAGNOSIS — R631 Polydipsia: Secondary | ICD-10-CM

## 2021-10-27 LAB — BASIC METABOLIC PANEL
BUN: 21 mg/dL (ref 6–23)
CO2: 32 mEq/L (ref 19–32)
Calcium: 9.2 mg/dL (ref 8.4–10.5)
Chloride: 101 mEq/L (ref 96–112)
Creatinine, Ser: 0.8 mg/dL (ref 0.40–1.20)
GFR: 67.94 mL/min (ref >60.00)
Glucose, Bld: 70 mg/dL (ref 70–99)
Potassium: 3.6 mEq/L (ref 3.5–5.1)
Sodium: 141 mEq/L (ref 135–145)

## 2021-10-27 NOTE — Progress Notes (Signed)
Chief Complaint  Patient presents with   Skin Problem    Place on lip    Samantha Gregory is a 86 y.o. female here for a skin complaint.  Duration: several months Location: lip Pruritic? No Painful? No Drainage? No New soaps/lotions/topicals/detergents? No Sick contacts? No Other associated symptoms: has not gone away; it does not scale, bleed, or scab. Therapies tried thus far: steroid cream, Imiquimod was too expensive.   Still having dry mouth, increased thirst. Has been screened for DM in past and normal. +famhx of DM.  She stays well-hydrated.  Drinking water improves things but does not fully take away the sensation.  Notes a "pimple" in her R eye lower lid. Has been there for over a month. Stays red. Artificial tears not helpful. No pain, drainage, itching.   Left hip pain-anterior left hip pain that comes and goes.  She had a severe episode 2 weeks ago at night.  It is a sharp and burning pain.  No injury or change in activity.  No neurologic signs or symptoms.  Past Medical History:  Diagnosis Date   Abnormal cervical Papanicolaou smear 06/03/2017   Acute right ankle pain 04/06/2020   Bilateral carotid artery stenosis 09/30/2019   Cachexia ([place]) 05/22/2020   Chemical induced allergic contact dermatitis 05/28/2018   Closed compression fracture of L5 lumbar vertebra 06/03/2017   Pain control with scheduled Tylenol, prn Tramadol. Calcitonin x 2 months. PT. If not with adequate improvement in pain, to IR.    Compression fracture of body of thoracic vertebra ([place]) 11/28/2018   De Quervain's tenosynovitis, left 06/14/2018   Dry skin dermatitis 11/19/2018   Essential hypertension    Heart palpitations    Hypercholesterolemia    Left hip pain 09/30/2019   Low hemoglobin 12/11/2014   Patient was found at her gynecologic exam with Dr. Ulanda Edison to have a mildly low hemoglobin.  We will plan to check it next time along with an anemia panel.  In the meantime she will add a multivitamin  with iron once a day    Mitral regurgitation    Mitral valve insufficiency 07/09/2019   Moderate aortic insufficiency 09/02/2015   Echo 9/16: Normal LV function; mild LAE; trileaflet aortic valve with moderate (3+) central AI; mild to moderate MR; moderate TR.    Moderate protein-calorie malnutrition ([place]) 04/06/2020   Orthostatic hypotension    Osteoporosis 06/20/2011   Paroxysmal atrial tachycardia (HCC)    PVC's (premature ventricular contractions)    Squamous cell carcinoma, leg, right    Squamous cell carcinoma, leg, right    Stenosis of cervix 06/03/2017   Tachycardia 06/03/2017    BP 122/76   Pulse 62   Temp (!) 97.5 F (36.4 C) (Oral)   Ht 5\' 4"  (1.626 m)   Wt 80 lb 8 oz (36.5 kg)   SpO2 97%   BMI 13.82 kg/m  Gen: awake, alert, appearing stated age Lungs: No accessory muscle use Skin: Slightly raised and slightly scaled lesion over the vermilion border of the left upper lip.. No drainage, erythema, TTP, fluctuance, excoriation Over the right lower lid centrally, there is a small 0.2 cm ulceration/papule that is erythematous.  There is no active drainage. MSK: Tenderness to palpation over the left hip flexor, no tenderness over the bone or greater trochanter; she is unable to hold her leg up to complete a [place], negative Faber/FADDIR, negative logroll Psych: Age appropriate judgment and insight  Increased thirst - Plan: Basic metabolic panel  Dry mouth -  Plan: Sjogrens syndrome-A extractable nuclear antibody, Sjogrens syndrome-B extractable nuclear antibody  Eyelid lesion - Plan: Ambulatory referral to Ophthalmology  Skin lesion  Left hip pain - Plan: DG HIP UNILAT WITH PELVIS 2-3 VIEWS LEFT  Need for influenza vaccination - Plan: Flu Vaccine QUAD High Dose(Fluad)  Need for vaccination against Streptococcus pneumoniae - Plan: Pneumococcal conjugate vaccine 20-valent (Prevnar 20)  1/2.  Check labs.  I doubt she has diabetes.  If normal, will trial Salagen 5 mg twice  daily. 3.  Refer to ophthalmology.  She has a history of skin cancer and with this lesion being there for 1 month, I would like their opinion and if a procedure needs to be done, I would not be comfortable doing it there. 4.  Reassurance given for the lesion on her lip.  It does not appear cancerous like the other areas of her skin that were cancerous in the past. 5.  Check an x-ray to rule out arthritis.  Stretches and exercises provided.  Heat, ice, Tylenol.  Consider physical therapy. Flu shot today in addition to PCV 20.  Shingrix and COVID shot recommended. F/u pending the above results. The patient voiced understanding and agreement to the plan.  I spent 40 minutes with the patient discussing the above plans in addition to reviewing her chart on the same day of the visit.  [place], DO 10/27/21 12:06 PM

## 2021-10-27 NOTE — Patient Instructions (Addendum)
Give [place] 2-3 business days to get the results of your labs back. If they are normal, I will send in a medicine to take twice daily to help with this.   Ice/cold pack over area for 10-15 min twice daily.  Heat (pad or rice pillow in microwave) over affected area, 10-15 minutes twice daily.   OK to take Tylenol 1000 mg (2 extra strength tabs) or 975 mg (3 regular strength tabs) every 6 hours as needed.  We will be in touch regarding your X-ray results.   I recommend getting the updated bivalent covid vaccination booster at your convenience.   Let us know if you need anything.  Hip Exercises It is normal to feel mild stretching, pulling, tightness, or discomfort as you do these exercises, but you should stop right away if you feel sudden pain or your pain gets worse.   STRETCHING AND RANGE OF MOTION EXERCISES These exercises warm up your muscles and joints and improve the movement and flexibility of your hip. These exercises also help to relieve pain, numbness, and tingling. Exercise A: Hamstrings, Supine  Lie on your back. Loop a belt or towel over the ball of your left / right foot. The ball of your foot is on the walking surface, right under your toes. Straighten your left / right knee and slowly pull on the belt to raise your leg. Do not let your left / right knee bend while you do this. Keep your other leg flat on the floor. Raise the left / right leg until you feel a gentle stretch behind your left / right knee or thigh. Hold this position for 30 seconds. Slowly return your leg to the starting position. Repeat2 times. Complete this stretch 3 times per week. Exercise B: Hip Rotators  Lie on your back on a firm surface. Hold your left / right knee with your left / right hand. Hold your ankle with your other hand. Gently pull your left / right knee and rotate your lower leg toward your other shoulder. Pull until you feel a stretch in your buttocks. Keep your hips and shoulders  firmly planted while you do this stretch. Hold this position for 30 seconds. Repeat 2 times. Complete this stretch 3 times per week. Exercise C: V-Sit (Hamstrings and Adductors)  Sit on the floor with your legs extended in a large "V" shape. Keep your knees straight during this exercise. Start with your head and chest upright, then bend at your waist to reach for your left foot (position A). You should feel a stretch in your right inner thigh. Hold this position for 30 seconds. Then slowly return to the upright position. Bend at your waist to reach forward (position B). You should feel a stretch behind both of your thighs and knees. Hold this position for 30 seconds. Then slowly return to the upright position. Bend at your waist to reach for your right foot (position C). You should feel a stretch in your left inner thigh. Hold this position for 30 seconds. Then slowly return to the upright position. Repeat A, B, and C 2 times each. Complete this stretch 3 times per week. Exercise D: Lunge (Hip Flexors)  Place your left / right knee on the floor and bend your other knee so that is directly over your ankle. You should be half-kneeling. Keep good posture with your head over your shoulders. Tighten your buttocks to point your tailbone downward. This helps your back to keep from arching too much. You  should feel a gentle stretch in the front of your left / right thigh and hip. If you do not feel any resistance, slightly slide your other foot forward and then slowly lunge forward so your knee once again lines up over your ankle. Make sure your tailbone continues to point downward. Hold this position for 30 seconds. Repeat 2 times. Complete this stretch 3 times per week.  STRENGTHENING EXERCISES These exercises build strength and endurance in your hip. Endurance is the ability to use your muscles for a long time, even after they get tired. Exercise E: Bridge (Hip Extensors)  Lie on your back on a  firm surface with your knees bent and your feet flat on the floor. Tighten your buttocks muscles and lift your bottom off the floor until the trunk of your body is level with your thighs. Do not arch your back. You should feel the muscles working in your buttocks and the back of your thighs. If you do not feel these muscles, slide your feet 1-2 inches (2.5-5 cm) farther away from your buttocks. Hold this position for 3 seconds. Slowly lower your hips to the starting position. Repeat for a total of 10 repetitions. Let your muscles relax completely between repetitions. If this exercise is too easy, try doing it with your arms crossed over your chest. Repeat 2 times. Complete this exercise 3 times per week. Exercise F: Straight Leg Raises - Hip Abductors  Lie on your side with your left / right leg in the top position. Lie so your head, shoulder, knee, and hip line up with each other. You may bend your bottom knee to help you balance. Roll your hips slightly forward, so your hips are stacked directly over each other and your left / right knee is facing forward. Leading with your heel, lift your top leg 4-6 inches (10-15 cm). You should feel the muscles in your outer hip lifting. Do not let your foot drift forward. Do not let your knee roll toward the ceiling. Hold this position for 1 second. Slowly return to the starting position. Let your muscles relax completely between repetitions. Repeat for a total of 10 repetitions.  Repeat 2 times. Complete this exercise 3 times per week. Exercise G: Straight Leg Raises - Hip Adductors  Lie on your side with your left / right leg in the bottom position. Lie so your head, shoulder, knee, and hip line up. You may place your upper foot in front to help you balance. Roll your hips slightly forward, so your hips are stacked directly over each other and your left / right knee is facing forward. Tense the muscles in your inner thigh and lift your bottom leg 4-6  inches (10-15 cm). Hold this position for 1 second. Slowly return to the starting position. Let your muscles relax completely between repetitions. Repeat for a total of 10 repetitions. Repeat 2 times. Complete this exercise 3 times per week. Exercise H: Straight Leg Raises - Quadriceps  Lie on your back with your left / right leg extended and your other knee bent. Tense the muscles in the front of your left / right thigh. When you do this, you should see your kneecap slide up or see increased dimpling just above your knee. Tighten these muscles even more and raise your leg 4-6 inches (10-15 cm) off the floor. Hold this position for 3 seconds. Keep these muscles tense as you lower your leg. Relax the muscles slowly and completely between repetitions. Repeat for a  total of 10 repetitions. Repeat 2 times. Complete this exercise 3 times per week. Exercise I: Hip Abductors, Standing Tie one end of a rubber exercise band or tubing to a secure surface, such as a table or pole. Loop the other end of the band or tubing around your left / right ankle. Keeping your ankle with the band or tubing directly opposite of the secured end, step away until there is tension in the tubing or band. Hold onto a chair as needed for balance. Lift your left / right leg out to your side. While you do this: Keep your back upright. Keep your shoulders over your hips. Keep your toes pointing forward. Make sure to use your hip muscles to lift your leg. Do not "throw" your leg or tip your body to lift your leg. Hold this position for 1 second. Slowly return to the starting position. Repeat for a total of 10 repetitions. Repeat 2 times. Complete this exercise 3 times per week. Exercise J: Squats (Quadriceps) Stand in a door frame so your feet and knees are in line with the frame. You may place your hands on the frame for balance. Slowly bend your knees and lower your hips like you are going to sit in a chair. Keep your  lower legs in a straight-up-and-down position. Do not let your hips go lower than your knees. Do not bend your knees lower than told by your health care provider. If your hip pain increases, do not bend as low. Hold this position for 1 second. Slowly push with your legs to return to standing. Do not use your hands to pull yourself to standing. Repeat for a total of 10 repetitions. Repeat 2 times. Complete this exercise 3 times per week. Make sure you discuss any questions you have with your health care provider. Document Released: 12/30/2005 Document Revised: 09/05/2016 Document Reviewed: 12/07/2015 Elsevier Interactive Patient Education  [employer].

## 2021-10-28 ENCOUNTER — Other Ambulatory Visit: Payer: Self-pay | Admitting: Family Medicine

## 2021-10-28 LAB — SJOGRENS SYNDROME-B EXTRACTABLE NUCLEAR ANTIBODY: SSB (La) (ENA) Antibody, IgG: <1 AI

## 2021-10-28 LAB — SJOGRENS SYNDROME-A EXTRACTABLE NUCLEAR ANTIBODY: SSA (Ro) (ENA) Antibody, IgG: <1 AI

## 2021-10-28 MED ORDER — PILOCARPINE HCL 5 MG PO TABS: 5.0000 mg | ORAL_TABLET | Freq: Two times a day (BID) | ORAL | 0 refills | Status: DC

## 2021-11-08 ENCOUNTER — Telehealth: Payer: Self-pay | Admitting: Family Medicine

## 2021-11-08 NOTE — Telephone Encounter (Signed)
Copied and mailed as requested

## 2021-11-08 NOTE — Telephone Encounter (Signed)
Pt would like documentation from ov on 11/2 including any shots, medication changes, etc for her records. Pt requested documentation be mailed to the address on file. Please advise.

## 2021-11-20 ENCOUNTER — Other Ambulatory Visit: Payer: Self-pay | Admitting: Family Medicine

## 2021-11-29 ENCOUNTER — Encounter: Payer: Self-pay | Admitting: Family Medicine

## 2021-11-29 ENCOUNTER — Ambulatory Visit (INDEPENDENT_AMBULATORY_CARE_PROVIDER_SITE_OTHER): Payer: PPO | Admitting: Family Medicine

## 2021-11-29 VITALS — BP 140/80 | HR 70 | Temp 97.4°F | Ht 64.0 in | Wt 85.2 lb

## 2021-11-29 DIAGNOSIS — C44722 Squamous cell carcinoma of skin of right lower limb, including hip: Secondary | ICD-10-CM

## 2021-11-29 DIAGNOSIS — R682 Dry mouth, unspecified: Secondary | ICD-10-CM | POA: Insufficient documentation

## 2021-11-29 DIAGNOSIS — M7989 Other specified soft tissue disorders: Secondary | ICD-10-CM | POA: Diagnosis not present

## 2021-11-29 DIAGNOSIS — D489 Neoplasm of uncertain behavior, unspecified: Secondary | ICD-10-CM

## 2021-11-29 LAB — COMPREHENSIVE METABOLIC PANEL
ALT: 38 U/L — ABNORMAL HIGH (ref 0–35)
AST: 43 U/L — ABNORMAL HIGH (ref 0–37)
Albumin: 4.2 g/dL (ref 3.5–5.2)
Alkaline Phosphatase: 85 U/L (ref 39–117)
BUN: 22 mg/dL (ref 6–23)
CO2: 33 mEq/L — ABNORMAL HIGH (ref 19–32)
Calcium: 9.3 mg/dL (ref 8.4–10.5)
Chloride: 100 mEq/L (ref 96–112)
Creatinine, Ser: 0.85 mg/dL (ref 0.40–1.20)
GFR: 63.13 mL/min (ref >60.00)
Glucose, Bld: 73 mg/dL (ref 70–99)
Potassium: 3.8 mEq/L (ref 3.5–5.1)
Sodium: 137 mEq/L (ref 135–145)
Total Bilirubin: 0.7 mg/dL (ref 0.2–1.2)
Total Protein: 6.6 g/dL (ref 6.0–8.3)

## 2021-11-29 NOTE — Progress Notes (Signed)
Chief Complaint  Patient presents with   Edema    Legs swelling     Samantha Gregory here for bilateral leg swelling.  Duration: 3 weeks Hx of prolonged bedrest, recent surgery, travel or injury? No Pain the calf? No SOB? Sometimes yes, thought it could be due to allergies Personal or family history of clot or bleeding disorder? No Hx of heart failure, renal failure, hepatic failure? No Dietary change? No  Medication change? Started on [place] a little over a mo ago, 1-5% risk of LE edema; she only took 1 pill as she had sweating issues with it.   Hx of SCC on LE's, tx'd w E&C. Used to see derm. Prefers to have it done here now. Was outside in sun without protection a lot when she was younger.   Past Medical History:  Diagnosis Date   Abnormal cervical Papanicolaou smear 06/03/2017   Acute right ankle pain 04/06/2020   Bilateral carotid artery stenosis 09/30/2019   Cachexia ([place]) 05/22/2020   Chemical induced allergic contact dermatitis 05/28/2018   Closed compression fracture of L5 lumbar vertebra 06/03/2017   Pain control with scheduled Tylenol, prn Tramadol. Calcitonin x 2 months. PT. If not with adequate improvement in pain, to IR.    Compression fracture of body of thoracic vertebra ([place]) 11/28/2018   De Quervain's tenosynovitis, left 06/14/2018   Dry skin dermatitis 11/19/2018   Essential hypertension    Heart palpitations    Hypercholesterolemia    Left hip pain 09/30/2019   Low hemoglobin 12/11/2014   Patient was found at her gynecologic exam with Dr. Ulanda Edison to have a mildly low hemoglobin.  We will plan to check it next time along with an anemia panel.  In the meantime she will add a multivitamin with iron once a day    Mitral regurgitation    Mitral valve insufficiency 07/09/2019   Moderate aortic insufficiency 09/02/2015   Echo 9/16: Normal LV function; mild LAE; trileaflet aortic valve with moderate (3+) central AI; mild to moderate MR; moderate TR.     Moderate protein-calorie malnutrition ([place]) 04/06/2020   Orthostatic hypotension    Osteoporosis 06/20/2011   Paroxysmal atrial tachycardia (HCC)    PVC's (premature ventricular contractions)    Squamous cell carcinoma, leg, right    Stenosis of cervix 06/03/2017   Tachycardia 06/03/2017   Family History  Problem Relation Age of Onset   Heart attack Father    Stroke Mother    Kidney disease Mother    Heart disease Brother    Hypertension Brother    Heart disease Sister    Heart disease Sister    Stroke Paternal Uncle    Stroke Paternal Aunt    Past Surgical History:  Procedure Laterality Date   APPENDECTOMY     TEE WITHOUT CARDIOVERSION N/A 09/08/2015   Procedure: TRANSESOPHAGEAL ECHOCARDIOGRAM (TEE);  Surgeon: Lelon Perla, MD;  Location: [facility] ENDOSCOPY;  Service: Cardiovascular;  Laterality: N/A;    Current Outpatient Medications:    triamcinolone cream (KENALOG) 0.1 %, Apply 1 application topically 2 (two) times daily., Disp: 30 g, Rfl: 0  BP 140/80   Pulse 70   Temp (!) 97.4 F (36.3 C) (Oral)   Ht 5\' 4"  (1.626 m)   Wt 85 lb 4 oz (38.7 kg)   SpO2 99%   BMI 14.63 kg/m  Gen- awake, alert, appears stated age Heart- RRR, no murmurs, 2+ pitting LE edema b/l tapering at mid tibia Lungs- CTAB, normal effort w/o accessory muscle  use Skin: There is a raised and excoriated lesion on her anterior R shin measuring 1.2 x 0.4 cm in diameter MSK- no calf pain b/l to palpation Psych: Age appropriate judgment and insight  Procedure note; shave biopsy Informed consent was obtained. The area was cleaned with alcohol and injected with 1 mL of 1% lidocaine with epinephrine. A Dermablade was slightly bent and used to cut under the area of interest. The specimen was placed in a sterile specimen cup and sent to the lab. The area was then cauterized ensuring adequate hemostasis. The area was dressed with triple antibiotic ointment and a bandage. There were no complications  noted. The patient tolerated the procedure well.  Localized swelling of both lower extremities - Plan: Comprehensive metabolic panel  Dry mouth  Neoplasm of uncertain behavior - Plan: Surgical pathology( [place]/ [facility]), PR SHAV SKIN LES 1.1-2.0 CM TRUNK,ARM,LEG  New problem, uncertain prog. Ck labs, if neg, will ck echo given age and sob. Discussed minding salt intake, elevating legs, compression stockings, physical activity. Offered med, AE from pilocarpine, will stop. Cevimeline offered, declined politely for now. Suspect return of [place]. Will biopsy today, E&C if +. Aftercare instructions verbalized and written down.  F/u prn. Pt voiced understanding and agreement to the plan.  [place], DO 11/29/21  10:51 AM

## 2021-11-29 NOTE — Patient Instructions (Addendum)
Give [place] 2-3 business days to get the results of your labs back. If they are normal, I will order an echo/ultrasound of your heart.   For the swelling in your lower extremities, be sure to elevate your legs when able, mind the salt intake, stay physically active and consider wearing compression stockings.  If you are interested in starting a new medication for your dry mouth/thirst, please let me know.   The swelling is not likely to be caused by anything topical (like the bubble bath additive).   Stop chewing gum, drinking carbonated beverages, gulping liquids, and drinking alcohol to help with belching.  These foods may cause you to belch more: Wheat, barley, rye, onion, leek, white part of spring onion, garlic, shallots, artichokes, beetroot, fennel, peas, chicory, pistachio, cashews, legumes, lentils, and chickpeas; Milk, custard, ice cream, and yogurt; Apples, pears, mangoes, cherries, watermelon, asparagus, sugar snap peas, honey, high-fructose corn syrup; Apricots, nectarines, peaches, plums, mushrooms, cauliflower, artificially sweetened chewing gum and confectionery  Take Metamucil or Benefiber daily.  Do not shower for the rest of the day. When you do wash it, use only soap and water. Do not vigorously scrub. Apply triple antibiotic ointment (like Neosporin) twice daily. Keep the area clean and dry.   Things to look out for: increasing pain not relieved by ibuprofen/acetaminophen, fevers, spreading redness, drainage of pus, or foul odor.  Let us know if you need anything.

## 2021-12-03 ENCOUNTER — Telehealth: Payer: Self-pay | Admitting: Family Medicine

## 2021-12-03 NOTE — Telephone Encounter (Signed)
Spoke to the patient this morning. She just would like to know why she is having so much pain when taking steps. It has improved but just would like to know the cause if you know.  She is scheduled on Monday the 12th for her Procedure and I did tell her this can be discussed at that time.

## 2021-12-06 ENCOUNTER — Encounter: Payer: Self-pay | Admitting: Family Medicine

## 2021-12-06 ENCOUNTER — Ambulatory Visit (INDEPENDENT_AMBULATORY_CARE_PROVIDER_SITE_OTHER): Payer: PPO | Admitting: Family Medicine

## 2021-12-06 ENCOUNTER — Other Ambulatory Visit: Payer: Self-pay | Admitting: Family Medicine

## 2021-12-06 DIAGNOSIS — R7989 Other specified abnormal findings of blood chemistry: Secondary | ICD-10-CM | POA: Diagnosis not present

## 2021-12-06 DIAGNOSIS — C4492 Squamous cell carcinoma of skin, unspecified: Secondary | ICD-10-CM | POA: Diagnosis not present

## 2021-12-06 DIAGNOSIS — M7989 Other specified soft tissue disorders: Secondary | ICD-10-CM

## 2021-12-06 LAB — HEPATIC FUNCTION PANEL
ALT: 27 U/L (ref 0–35)
AST: 31 U/L (ref 0–37)
Albumin: 4.1 g/dL (ref 3.5–5.2)
Alkaline Phosphatase: 96 U/L (ref 39–117)
Bilirubin, Direct: 0.1 mg/dL (ref 0.0–0.3)
Total Bilirubin: 0.6 mg/dL (ref 0.2–1.2)
Total Protein: 6.6 g/dL (ref 6.0–8.3)

## 2021-12-06 NOTE — Progress Notes (Signed)
CC: Skin lesion  Subjective: Patient is a 86 y.o. female here for f/u.  Pt had skin biopsy last week showing well diff SCC. She has a hx of this. Lots of UV light exposure when she was younger working and playing outside w/o protection.   Still having swelling in her LE's. Compression hose expensive, wondering if there is rx to help with this. Still having pain in both legs. LFT's elevated at initial lab workup.   Past Medical History:  Diagnosis Date   Abnormal cervical Papanicolaou smear 06/03/2017   Acute right ankle pain 04/06/2020   Bilateral carotid artery stenosis 09/30/2019   Cachexia ([place]) 05/22/2020   Chemical induced allergic contact dermatitis 05/28/2018   Closed compression fracture of L5 lumbar vertebra 06/03/2017   Pain control with scheduled Tylenol, prn Tramadol. Calcitonin x 2 months. PT. If not with adequate improvement in pain, to IR.    Compression fracture of body of thoracic vertebra ([place]) 11/28/2018   De Quervain's tenosynovitis, left 06/14/2018   Dry skin dermatitis 11/19/2018   Essential hypertension    Heart palpitations    Hypercholesterolemia    Left hip pain 09/30/2019   Low hemoglobin 12/11/2014   Patient was found at her gynecologic exam with Dr. Ulanda Edison to have a mildly low hemoglobin.  We will plan to check it next time along with an anemia panel.  In the meantime she will add a multivitamin with iron once a day    Mitral regurgitation    Mitral valve insufficiency 07/09/2019   Moderate aortic insufficiency 09/02/2015   Echo 9/16: Normal LV function; mild LAE; trileaflet aortic valve with moderate (3+) central AI; mild to moderate MR; moderate TR.    Moderate protein-calorie malnutrition ([place]) 04/06/2020   Orthostatic hypotension    Osteoporosis 06/20/2011   Paroxysmal atrial tachycardia (HCC)    PVC's (premature ventricular contractions)    Squamous cell carcinoma, leg, right    Stenosis of cervix 06/03/2017   Tachycardia 06/03/2017     Objective: General: Awake, appears stated age Heart: 2-3+ pitting b/l LE edema tapering at knees Lungs: No accessory muscle use Skin: Excoriated wound on RLE anteriorly measuring 1.2 cm x 0.5 cm in diameter Psych: Age appropriate judgment and insight, normal affect and mood  Procedure note: Electrodesiccation and curettage Informed consent obtained. The area of interest was cleaned with alcohol and anesthetized with 2.5 mL of 1% lidocaine with epinephrine. After adequate anesthesia was obtained, the area was electrodesiccated with a Hyfrecator and the charred tissue was scraped away with a curette. This process was repeated 2 more times for a total of 3 rounds. The cancerous tissue was easily noted by texture under the curette compared to the healthy surrounding tissue After the final round, the area was cauterized to ensure adequate hemostasis. Triple antibiotic ointment and a bandage were placed. There were no immediate complications noted. The patient tolerated the procedure well.  Assessment and Plan: SCC (squamous cell carcinoma) - Plan: PR DESTR MALIG TRUNK,EXTREM 1.1-2 CM  Elevated LFTs - Plan: Hepatic function panel  E&C today.  Aftercare instructions verbalized written down. Recheck labs today.  If still elevated, will check right upper quadrant ultrasound.  If negative, will check echo to further evaluate for lower extremity edema. The patient voiced understanding and agreement to the plan.  [place], DO 12/06/21  11:47 AM

## 2021-12-06 NOTE — Patient Instructions (Signed)
Do not shower for the rest of the day. When you do wash it, use only soap and water. Do not vigorously scrub. Apply triple antibiotic ointment (like Neosporin) twice daily. Keep the area clean and dry.   Things to look out for: increasing pain not relieved by ibuprofen/acetaminophen, fevers, spreading redness, drainage of pus, or foul odor.  Give [place] 2-3 business days to get the results of your labs back.   Let us know if you need anything.

## 2021-12-08 ENCOUNTER — Telehealth: Payer: Self-pay | Admitting: Family Medicine

## 2021-12-08 DIAGNOSIS — M7989 Other specified soft tissue disorders: Secondary | ICD-10-CM

## 2021-12-08 NOTE — Telephone Encounter (Signed)
Patient informed of PCP instructions. Also again offered the fluid pill, she declined. She is going to call the cardiology office in the morning to see if the Echo can be moved earlier. She will call us back if has any more concerns//questions

## 2021-12-08 NOTE — Telephone Encounter (Signed)
The patients Echo appt is not until 01/12/2022. She prefers not to take a fluid pill She is really having a hard time getting around//unable to get to the grocery store due to her legs/feet hurting. She would like to know if anything else can be done.

## 2021-12-08 NOTE — Telephone Encounter (Signed)
Pt called for on going leg swelling, she wears compression socks but her legs seem to be hard to walk on. Advised she make an appt, but she stated she was just here and would just like a call back today regarding this. Please advise.

## 2021-12-09 NOTE — Telephone Encounter (Signed)
Please advise pt is scheduled for 01/12/22

## 2021-12-09 NOTE — Telephone Encounter (Signed)
Please order echo as stat and order XR, dg tib/fib b/l. Ty.

## 2021-12-09 NOTE — Telephone Encounter (Signed)
Spoke with Butch Penny at scheduling to try to make the echo STAT she stated they are booked all locations and if patient wants STAT echo she would need to go to the ER .   [phone]

## 2021-12-09 NOTE — Telephone Encounter (Signed)
Hm, see if she's able to come in for a film then. If the pain is too unbearable for even that, she needs to call an ambulance or have someone take her to the ER.

## 2021-12-09 NOTE — Telephone Encounter (Signed)
Pt wanted to inform she is taking double dose of vitamin d3 and was trying to see if she could get a call back to let her know if that is effecting leg swelling.

## 2021-12-09 NOTE — Telephone Encounter (Signed)
Pt called to get an earlier cardiogram, transferred her to imaging they transferred her back and stated her pcp could call and get her in there sooner. She is in a lot of pain and cannot get out of her house to get groceries, please advise.

## 2021-12-10 ENCOUNTER — Emergency Department (HOSPITAL_BASED_OUTPATIENT_CLINIC_OR_DEPARTMENT_OTHER)
Admission: EM | Admit: 2021-12-10 | Discharge: 2021-12-10 | Discharge status: Against Medical Advice | Payer: PPO | Attending: Emergency Medicine | Admitting: Emergency Medicine

## 2021-12-10 ENCOUNTER — Emergency Department (HOSPITAL_BASED_OUTPATIENT_CLINIC_OR_DEPARTMENT_OTHER): Payer: PPO

## 2021-12-10 ENCOUNTER — Encounter (HOSPITAL_BASED_OUTPATIENT_CLINIC_OR_DEPARTMENT_OTHER): Payer: Self-pay | Admitting: Emergency Medicine

## 2021-12-10 ENCOUNTER — Other Ambulatory Visit: Payer: Self-pay

## 2021-12-10 DIAGNOSIS — M7989 Other specified soft tissue disorders: Secondary | ICD-10-CM | POA: Diagnosis not present

## 2021-12-10 DIAGNOSIS — Z85828 Personal history of other malignant neoplasm of skin: Secondary | ICD-10-CM | POA: Diagnosis not present

## 2021-12-10 DIAGNOSIS — I10 Essential (primary) hypertension: Secondary | ICD-10-CM | POA: Insufficient documentation

## 2021-12-10 DIAGNOSIS — R600 Localized edema: Secondary | ICD-10-CM | POA: Diagnosis not present

## 2021-12-10 DIAGNOSIS — I517 Cardiomegaly: Secondary | ICD-10-CM | POA: Diagnosis not present

## 2021-12-10 DIAGNOSIS — R609 Edema, unspecified: Secondary | ICD-10-CM

## 2021-12-10 LAB — CBC WITH DIFFERENTIAL/PLATELET
Abs Immature Granulocytes: 0.02 10*3/uL (ref 0.00–0.07)
Basophils Absolute: 0 10*3/uL (ref 0.0–0.1)
Basophils Relative: 1 %
Eosinophils Absolute: 0 10*3/uL (ref 0.0–0.5)
Eosinophils Relative: 0 %
HCT: 34.9 % — ABNORMAL LOW (ref 36.0–46.0)
Hemoglobin: 11.4 g/dL — ABNORMAL LOW (ref 12.0–15.0)
Immature Granulocytes: 0 %
Lymphocytes Relative: 12 %
Lymphs Abs: 0.6 10*3/uL — ABNORMAL LOW (ref 0.7–4.0)
MCH: 30.6 pg (ref 26.0–34.0)
MCHC: 32.7 g/dL (ref 30.0–36.0)
MCV: 93.6 fL (ref 80.0–100.0)
Monocytes Absolute: 0.4 10*3/uL (ref 0.1–1.0)
Monocytes Relative: 8 %
Neutro Abs: 3.9 10*3/uL (ref 1.7–7.7)
Neutrophils Relative %: 79 %
Platelets: 211 10*3/uL (ref 150–400)
RBC: 3.73 MIL/uL — ABNORMAL LOW (ref 3.87–5.11)
RDW: 13.7 % (ref 11.5–15.5)
WBC: 5 10*3/uL (ref 4.0–10.5)
nRBC: 0 % (ref 0.0–0.2)

## 2021-12-10 LAB — COMPREHENSIVE METABOLIC PANEL
ALT: 28 U/L (ref 0–44)
AST: 32 U/L (ref 15–41)
Albumin: 3.8 g/dL (ref 3.5–5.0)
Alkaline Phosphatase: 95 U/L (ref 38–126)
Anion gap: 6 (ref 5–15)
BUN: 25 mg/dL — ABNORMAL HIGH (ref 8–23)
CO2: 30 mmol/L (ref 22–32)
Calcium: 9.3 mg/dL (ref 8.9–10.3)
Chloride: 103 mmol/L (ref 98–111)
Creatinine, Ser: 0.71 mg/dL (ref 0.44–1.00)
GFR, Estimated: >60 mL/min (ref >60)
Glucose, Bld: 76 mg/dL (ref 70–99)
Potassium: 3.5 mmol/L (ref 3.5–5.1)
Sodium: 139 mmol/L (ref 135–145)
Total Bilirubin: 0.4 mg/dL (ref 0.3–1.2)
Total Protein: 6.8 g/dL (ref 6.5–8.1)

## 2021-12-10 LAB — BRAIN NATRIURETIC PEPTIDE: B Natriuretic Peptide: 377.5 pg/mL — ABNORMAL HIGH (ref 0.0–100.0)

## 2021-12-10 NOTE — ED Notes (Signed)
Pt tells me at triage that she got lost coming here today.  When asked she admits to living alone, that her sons are 5+ hours away.  She is extremely HOH.

## 2021-12-10 NOTE — ED Notes (Signed)
Pt visualized ambulating out of ED.  EDP Josh made aware.

## 2021-12-10 NOTE — ED Notes (Signed)
Pt to the RN station stating that she has to leave, asking how much longer to wait for test results. Pt informed that we are still waiting for some test results, unsure of specific amount of time to wait.  EDP Josh made aware.

## 2021-12-10 NOTE — ED Provider Notes (Signed)
[facility] EMERGENCY DEPARTMENT Provider Note   CSN: [id number] Arrival date & time: 12/10/21  1111     History Chief Complaint  Patient presents with   Leg Swelling    Samantha Gregory is a 86 y.o. female.  Patient with history of squamous cell carcinoma, aortic insufficiency --presents to the emergency department for lower extremity swelling, right greater than left with associated soreness.  Patient has been seen by her PCP for this problem recently.  They had ordered an outpatient echocardiogram due to be performed in January.  Patient states that the soreness and swelling are making it difficult for her to do activities at home.  She denies shortness of breath, cough, orthopnea.  No fevers or URI symptoms.  No abdominal pain.  She states that a few weeks ago her left leg was more swollen than her right leg currently is, and then her left leg improved.  Per patient, she was sent to the emergency department by PCP today because "they need more information about my heart". The onset of this condition was acute on chronic. The course is waxing and waning.  Aggravating factors: Ambulation. Alleviating factors: none.        Past Medical History:  Diagnosis Date   Abnormal cervical Papanicolaou smear 06/03/2017   Acute right ankle pain 04/06/2020   Bilateral carotid artery stenosis 09/30/2019   Cachexia ([place]) 05/22/2020   Chemical induced allergic contact dermatitis 05/28/2018   Closed compression fracture of L5 lumbar vertebra 06/03/2017   Pain control with scheduled Tylenol, prn Tramadol. Calcitonin x 2 months. PT. If not with adequate improvement in pain, to IR.    Compression fracture of body of thoracic vertebra ([place]) 11/28/2018   De Quervain's tenosynovitis, left 06/14/2018   Dry skin dermatitis 11/19/2018   Essential hypertension    Heart palpitations    Hypercholesterolemia    Left hip pain 09/30/2019   Low hemoglobin 12/11/2014   Patient was found at her  gynecologic exam with Dr. Ulanda Edison to have a mildly low hemoglobin.  We will plan to check it next time along with an anemia panel.  In the meantime she will add a multivitamin with iron once a day    Mitral regurgitation    Mitral valve insufficiency 07/09/2019   Moderate aortic insufficiency 09/02/2015   Echo 9/16: Normal LV function; mild LAE; trileaflet aortic valve with moderate (3+) central AI; mild to moderate MR; moderate TR.    Moderate protein-calorie malnutrition ([place]) 04/06/2020   Orthostatic hypotension    Osteoporosis 06/20/2011   Paroxysmal atrial tachycardia (HCC)    PVC's (premature ventricular contractions)    Squamous cell carcinoma, leg, right    Stenosis of cervix 06/03/2017   Tachycardia 06/03/2017    Patient Active Problem List   Diagnosis Date Noted   Dry mouth 11/29/2021   [place] ([place]) 07/06/2021   Senile purpura ([place]) 07/06/2021   Heart palpitations    Mitral regurgitation    Paroxysmal atrial tachycardia ([place])    Cachexia ([place]) 05/22/2020   Acute right ankle pain 04/06/2020   Moderate protein-calorie malnutrition ([place]) 04/06/2020   Left hip pain 09/30/2019   Bilateral carotid artery stenosis 09/30/2019   Mitral valve insufficiency 07/09/2019   Compression fracture of body of thoracic vertebra ([place]) 11/28/2018   Dry skin dermatitis 11/19/2018   De Quervain's tenosynovitis, left 06/14/2018   Chemical induced allergic contact dermatitis 05/28/2018   Squamous cell carcinoma, leg, right    Compression fracture of lumbar vertebra, initial encounter (  [place]) 06/03/2017   Abnormal cervical Papanicolaou smear 06/03/2017   Stenosis of cervix 06/03/2017   Tachycardia 06/03/2017   PVC's (premature ventricular contractions)    Moderate aortic insufficiency 09/02/2015   Essential hypertension 09/02/2015   Low hemoglobin 12/11/2014   Orthostatic hypotension 03/31/2014   Hypercholesterolemia 06/20/2011   Osteoporosis 06/20/2011    Past Surgical History:   Procedure Laterality Date   APPENDECTOMY     TEE WITHOUT CARDIOVERSION N/A 09/08/2015   Procedure: TRANSESOPHAGEAL ECHOCARDIOGRAM (TEE);  Surgeon: Lelon Perla, MD;  Location: [facility] ENDOSCOPY;  Service: Cardiovascular;  Laterality: N/A;     OB History   No obstetric history on file.     Family History  Problem Relation Age of Onset   Heart attack Father    Stroke Mother    Kidney disease Mother    Heart disease Brother    Hypertension Brother    Heart disease Sister    Heart disease Sister    Stroke Paternal Uncle    Stroke Paternal Aunt     Social History   Tobacco Use   Smoking status: Never   Smokeless tobacco: Never  Vaping Use   Vaping Use: Never used  Substance Use Topics   Alcohol use: Yes   Drug use: No    Home Medications Prior to Admission medications   Medication Sig Start Date End Date Taking? Authorizing Provider  triamcinolone cream (KENALOG) 0.1 % Apply 1 application topically 2 (two) times daily. 06/08/21   [name], DO    Allergies    Statins, Diazolidinyl urea [urea], Gold sodium thiosulfate, and Isothiazolinone chloride  Review of Systems   Review of Systems  Constitutional:  Negative for fever.  HENT:  Negative for rhinorrhea and sore throat.   Eyes:  Negative for redness.  Respiratory:  Negative for cough.   Cardiovascular:  Positive for leg swelling. Negative for chest pain.  Gastrointestinal:  Negative for abdominal pain, diarrhea, nausea and vomiting.  Genitourinary:  Negative for dysuria, frequency, hematuria and urgency.  Musculoskeletal:  Positive for myalgias.  Skin:  Negative for rash.  Neurological:  Negative for headaches.   Physical Exam Updated Vital Signs BP (!) 157/96 (BP Location: Left Arm)    Pulse 70    Temp 98 F (36.7 C) (Oral)    Resp 16    Ht 5\' 4"  (1.626 m)    Wt 38.7 kg    SpO2 100%    BMI 14.63 kg/m   Physical Exam Vitals and nursing note reviewed.  Constitutional:      General: She is not  in acute distress.    Appearance: She is well-developed.  HENT:     Head: Normocephalic and atraumatic.     Right Ear: External ear normal.     Left Ear: External ear normal.     Nose: Nose normal.  Eyes:     Conjunctiva/sclera: Conjunctivae normal.  Cardiovascular:     Rate and Rhythm: Normal rate and regular rhythm.     Heart sounds: No murmur heard. Pulmonary:     Effort: No respiratory distress.     Breath sounds: No wheezing, rhonchi or rales.  Abdominal:     Palpations: Abdomen is soft.     Tenderness: There is no abdominal tenderness. There is no guarding or rebound.  Musculoskeletal:     Cervical back: Normal range of motion and neck supple.     Right lower leg: Edema present.     Left lower leg: Edema present.  Comments: Right lower extremity: Healing anterior shin wound from recent biopsy.  Patient with 2-3+ edema up to the knees.  No signs of cellulitis.  2+ DP and PT pulses.  Varicosities noted.  Left lower extremity: 1+ edema to the mid ankle.  No signs of cellulitis.  2+ DP and PT pulses.  Varicosities noted.  Skin:    General: Skin is warm and dry.     Findings: No rash.  Neurological:     General: No focal deficit present.     Mental Status: She is alert. Mental status is at baseline.     Motor: No weakness.  Psychiatric:        Mood and Affect: Mood normal.    ED Results / Procedures / Treatments   Labs (all labs ordered are listed, but only abnormal results are displayed) Labs Reviewed  CBC WITH DIFFERENTIAL/PLATELET - Abnormal; Notable for the following components:      Result Value   RBC 3.73 (*)    Hemoglobin 11.4 (*)    HCT 34.9 (*)    Lymphs Abs 0.6 (*)    All other components within normal limits  COMPREHENSIVE METABOLIC PANEL - Abnormal; Notable for the following components:   BUN 25 (*)    All other components within normal limits  BRAIN NATRIURETIC PEPTIDE - Abnormal; Notable for the following components:   B Natriuretic Peptide 377.5  (*)    All other components within normal limits    EKG EKG Interpretation  Date/Time:  Friday December 10 2021 13:53:50 EST Ventricular Rate:  60 PR Interval:  148 QRS Duration: 85 QT Interval:  421 QTC Calculation: 421 R Axis:   15 Text Interpretation: Sinus rhythm Left ventricular hypertrophy Confirmed by Pattricia Boss [phone]) on 12/10/2021 2:57:27 PM  Radiology DG Chest 2 View  Result Date: 12/10/2021 CLINICAL DATA:  Leg swelling.  Evaluate for CHF EXAM: CHEST - 2 VIEW COMPARISON:  05/20/2016 FINDINGS: Osteopenia. Midline trachea. Moderate cardiomegaly. No pleural effusion or pneumothorax. No congestive failure. Mild bibasilar scarring. IMPRESSION: Cardiomegaly, without congestive failure or acute disease. Electronically Signed   By: Abigail Miyamoto M.D.   On: 12/10/2021 13:54    Procedures Procedures   Medications Ordered in ED Medications - No data to display  ED Course  I have reviewed the triage vital signs and the nursing notes.  Pertinent labs & imaging results that were available during my care of the patient were reviewed by me and considered in my medical decision making (see chart for details).  Patient seen and examined.  She is requesting additional work-up in regards to her heart today.  She is very anxious to leave the emergency department and has multiple questions about why the work-up I recommend is necessary.  She states that she wanted to obtain a picture of her heart.  I discussed that we do not have the ability to perform echocardiography here in emergency department.  Discussed that this may be done if she were to be admitted to the hospital, however she states that she will not be staying in the hospital today.  She asked that we proceed with work-up, but she is only willing to stay in the emergency department for 30 minutes.  Work-up ordered, patient encouraged to stay.  Vital signs reviewed and are as follows: BP (!) 157/96 (BP Location: Left Arm)     Pulse 70    Temp 98 F (36.7 C) (Oral)    Resp 16    Ht 5'  4" (1.626 m)    Wt 38.7 kg    SpO2 100%    BMI 14.63 kg/m   3:32 PM Patient left AMA. CXR showed cardiomegaly only.  BNP slightly elevated.  History of grade 1 diastolic failure on most recent echo in 09/2020.  Doppler not performed prior to patient leaving.    MDM Rules/Calculators/A&P                          Patient in no distress.  She has been anxious to leave since initial history and physical performed.  Patient has left AGAINST MEDICAL ADVICE.      Final Clinical Impression(s) / ED Diagnoses Final diagnoses:  Edema    Rx / DC Orders ED Discharge Orders     None        Carlisle Cater, PA-C 12/10/21 1534    Pattricia Boss, MD 12/11/21 [phone]

## 2021-12-10 NOTE — ED Notes (Signed)
Pt is wanting to leave.  Encouraged pt to stay and allow EDP to see her.  She

## 2021-12-10 NOTE — ED Triage Notes (Signed)
Pt states her doctor told her to come to ED for evaluation of leg swelling.  She relates that he wants an echocardiogram.

## 2021-12-10 NOTE — ED Notes (Signed)
Pt transported to xray 

## 2021-12-10 NOTE — ED Notes (Signed)
ED Provider at bedside. 

## 2021-12-10 NOTE — Telephone Encounter (Signed)
Called the patient encouraged her to go to the ER this morning in our building. She agreed to do so. She is still struggling with pain/swelling of both legs/feet.

## 2022-01-01 DIAGNOSIS — R4020 Unspecified coma: Secondary | ICD-10-CM | POA: Diagnosis not present

## 2022-01-01 DIAGNOSIS — R404 Transient alteration of awareness: Secondary | ICD-10-CM | POA: Diagnosis not present

## 2022-01-10 ENCOUNTER — Telehealth: Payer: Self-pay | Admitting: Family Medicine

## 2022-01-10 NOTE — Telephone Encounter (Signed)
Pat - [facility]  [phone] contact number   Fraser Din contacted ov regarding pt death certificate, pt passed away 2023/01/21.

## 2022-01-10 NOTE — Telephone Encounter (Signed)
Death certificate [place] waiting. She said was not a fall.  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 Called the  Director [facility] [place] and he  said EMS found her under her dining room table and felt was cardiac arrest. She had been there several days and a neighbor had gone and checked on her and found her.  The patient died on 2022/01/29.

## 2022-01-12 ENCOUNTER — Ambulatory Visit (HOSPITAL_BASED_OUTPATIENT_CLINIC_OR_DEPARTMENT_OTHER): Admission: RE | Admit: 2022-01-12 | Payer: PPO | Source: Ambulatory Visit

## 2022-01-26 DIAGNOSIS — 419620001 Death: Secondary | SNOMED CT | POA: Diagnosis not present

## 2022-01-26 DEATH — deceased

## 2022-06-13 IMAGING — DX DG CHEST 2V
2 series · 2 of 2 positions shown · non-contrast
Comparison: 05/20/2016

CLINICAL DATA: Leg swelling.  Evaluate for CHF

EXAM:
CHEST - 2 VIEW

[chest lat]
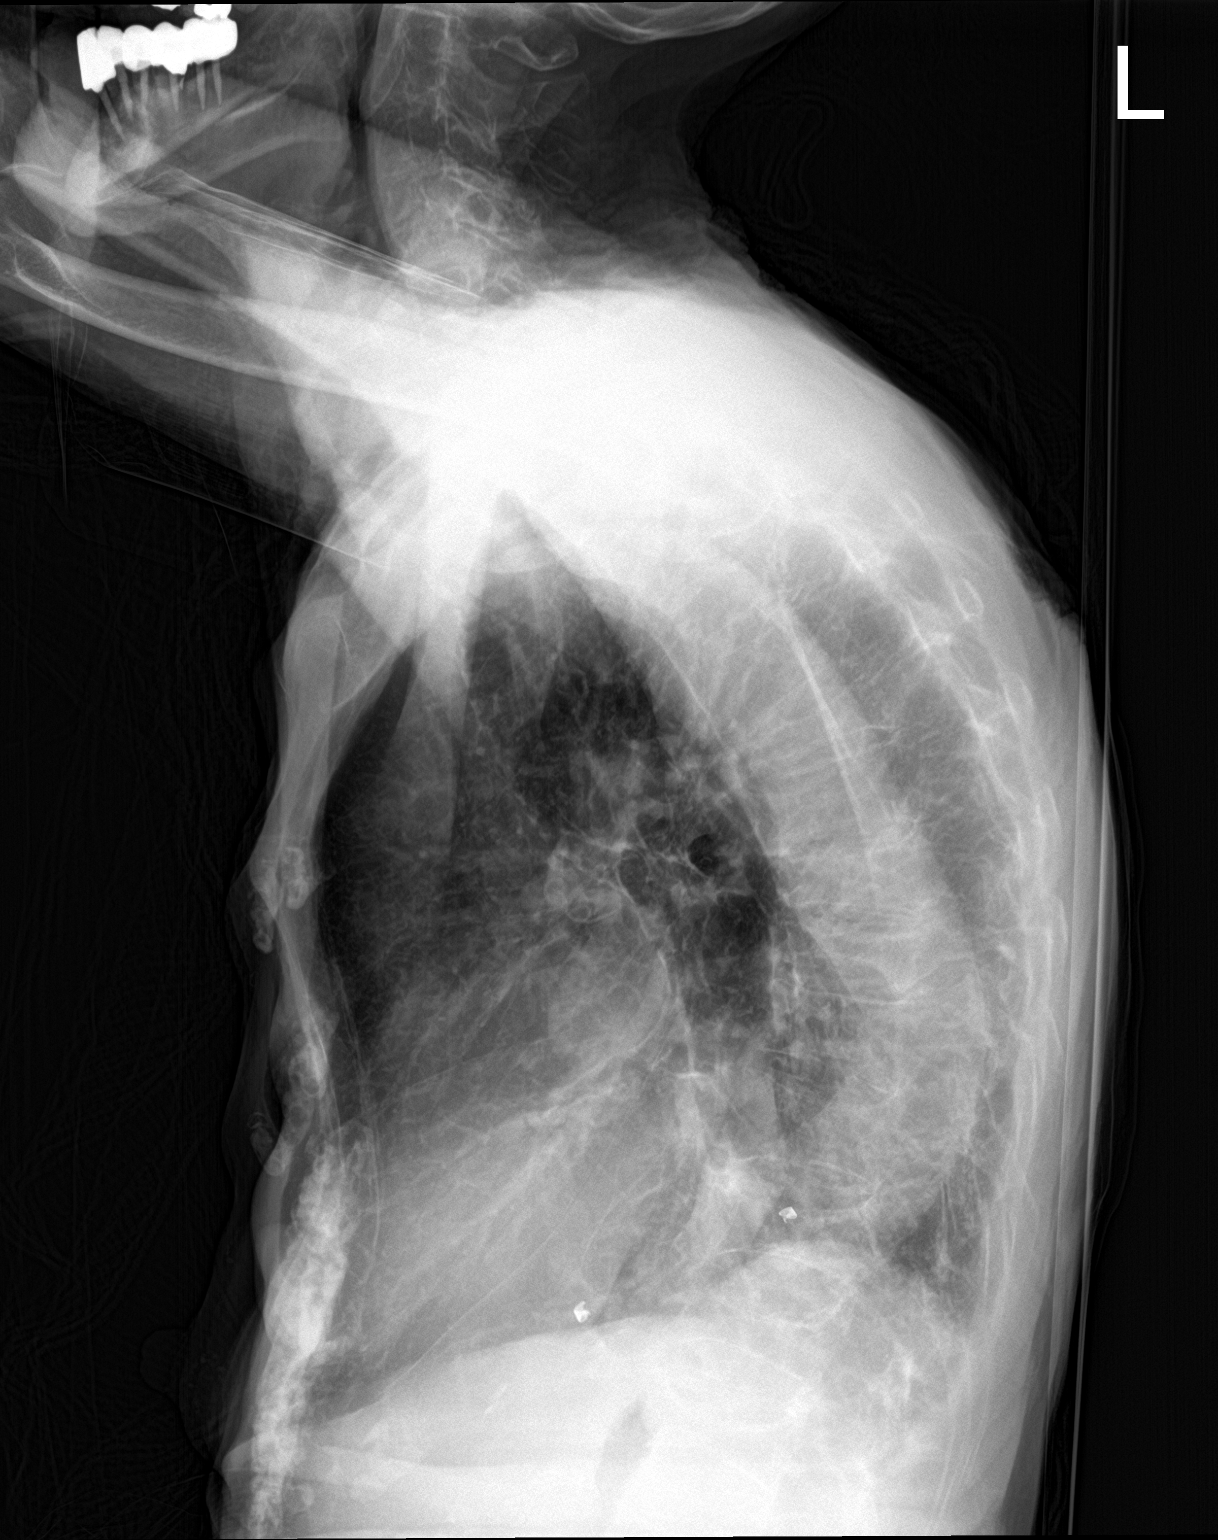

[chest ap strecther]
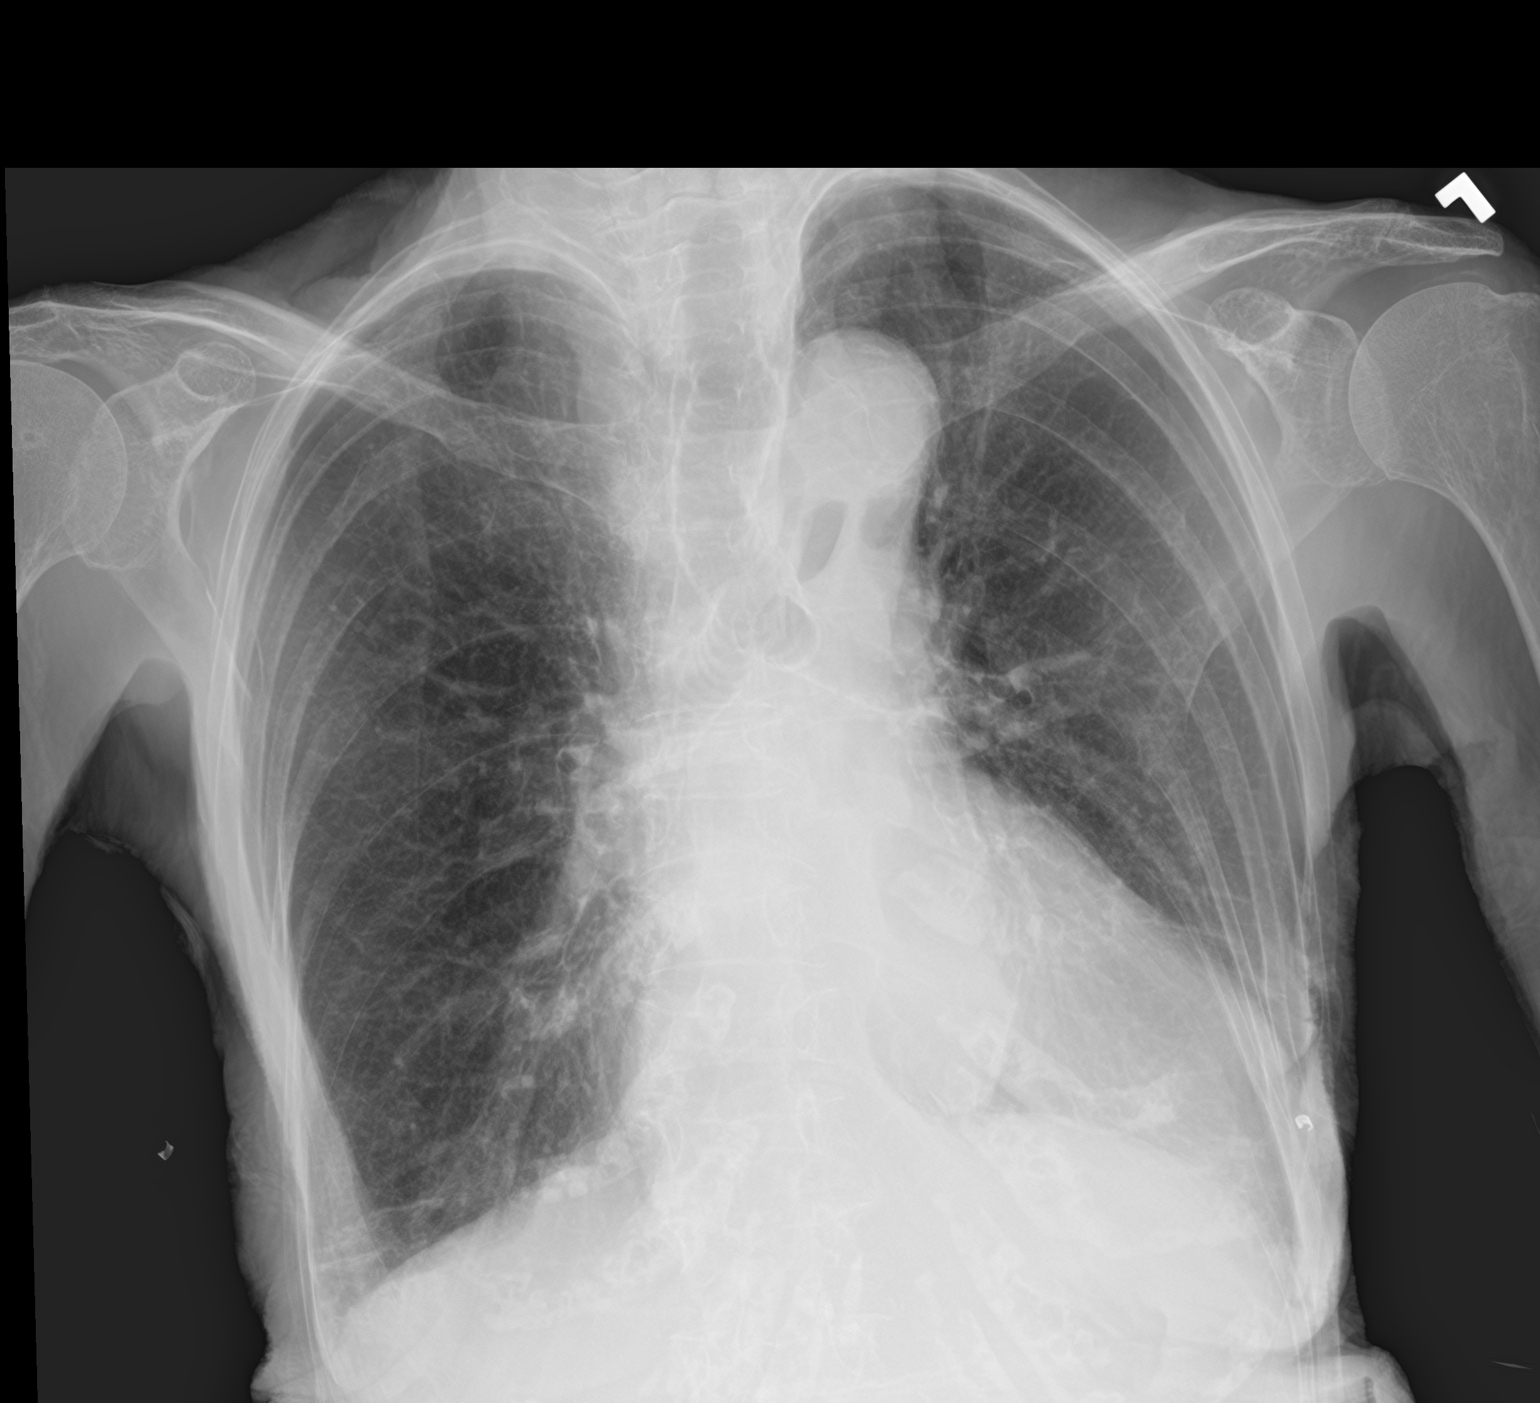

[2 of 2 positions shown; findings below may reference images not displayed]

FINDINGS: Osteopenia. Midline trachea. Moderate cardiomegaly. No pleural
effusion or pneumothorax. No congestive failure. Mild bibasilar
scarring.
IMPRESSION: Cardiomegaly, without congestive failure or acute disease.

## 2024-02-29 ENCOUNTER — Other Ambulatory Visit (HOSPITAL_COMMUNITY): Payer: Self-pay
